# Patient Record
Sex: Female | Born: 1950 | Hispanic: No | Marital: Single
Health system: Northeastern US, Academic
[De-identification: ages and names within clinical notes are randomized; demographics above are authoritative.]

## PROBLEM LIST (undated history)

## (undated) DIAGNOSIS — N289 Disorder of kidney and ureter, unspecified: Secondary | ICD-10-CM

## (undated) DIAGNOSIS — Z5189 Encounter for other specified aftercare: Secondary | ICD-10-CM

## (undated) DIAGNOSIS — I10 Essential (primary) hypertension: Secondary | ICD-10-CM

## (undated) DIAGNOSIS — E119 Type 2 diabetes mellitus without complications: Secondary | ICD-10-CM

## (undated) DIAGNOSIS — I509 Heart failure, unspecified: Secondary | ICD-10-CM

## (undated) HISTORY — PX: ABDOMINAL HYSTERECTOMY: SHX81

## (undated) HISTORY — PX: OTHER SURGICAL HISTORY: SHX169

---

## 2020-01-03 ENCOUNTER — Telehealth: Admit: 2020-01-03 | Payer: PRIVATE HEALTH INSURANCE | Attending: Hematology & Oncology

## 2020-01-03 NOTE — Telephone Encounter
S/W Maralyn Sago. Clarification is that patient has been worked up by GI already and Nephrologist is requesting a hematologic workup. Scheduling staff notified and will get patient scheduled to be seen by Dr. Daryel November. .Electronically Signed by Peggye Pitt, RN, January 03, 2020

## 2020-01-03 NOTE — Telephone Encounter
Sarah from [employer] called requesting an appt because the patient has low HGB, unexplained. Maralyn Sago can reach at [phone].

## 2020-01-03 NOTE — Telephone Encounter
Maralyn Sago called returning Joann's call. Maralyn Sago can reach at [phone].

## 2020-01-03 NOTE — Telephone Encounter
TC placed to [facility]. Maralyn Sago was not available. Message left explaining that the Nephrologist, Dr. Williams Che follows the patient's anemia and orders iron as needed. Patient was seen by Dr. Daryel November for Hx of DVTs and anticoagulation. Informed nurse that they can call office back with any further questions or concerns. .Electronically Signed by Peggye Pitt, RN, January 03, 2020

## 2020-01-05 ENCOUNTER — Ambulatory Visit: Admit: 2020-01-05 | Payer: MEDICAID | Attending: Hematology & Oncology

## 2020-01-05 ENCOUNTER — Encounter: Admit: 2020-01-05 | Payer: PRIVATE HEALTH INSURANCE | Attending: Hematology & Oncology

## 2020-01-05 ENCOUNTER — Ambulatory Visit: Admit: 2020-01-05 | Payer: PRIVATE HEALTH INSURANCE | Attending: Hematology & Oncology

## 2020-01-05 ENCOUNTER — Inpatient Hospital Stay: Admit: 2020-01-05 | Discharge: 2020-01-05 | Payer: BLUE CROSS/BLUE SHIELD

## 2020-01-05 DIAGNOSIS — I824Y9 Acute embolism and thrombosis of unspecified deep veins of unspecified proximal lower extremity: Secondary | ICD-10-CM

## 2020-01-05 DIAGNOSIS — N186 End stage renal disease: Secondary | ICD-10-CM

## 2020-01-05 LAB — CBC WITH AUTO DIFFERENTIAL
BKR WAM ABSOLUTE IMMATURE GRANULOCYTES: 0 x 1000/??L (ref 0.0–0.3)
BKR WAM ABSOLUTE LYMPHOCYTE COUNT: 0.6 x 1000/??L — ABNORMAL LOW (ref 1.0–4.0)
BKR WAM ABSOLUTE NRBC: 0 x 1000/ÂµL (ref 0.0–0.0)
BKR WAM ANALYZER ANC: 4.5 x 1000/??L (ref 1.0–11.0)
BKR WAM BASOPHIL ABSOLUTE COUNT: 0 x 1000/??L (ref 0.0–0.0)
BKR WAM BASOPHILS: 0.5 % (ref 0.0–4.0)
BKR WAM EOSINOPHIL ABSOLUTE COUNT: 0.6 x 1000/??L (ref 0.0–1.0)
BKR WAM EOSINOPHILS: 9.9 % — ABNORMAL HIGH (ref 0.0–7.0)
BKR WAM HEMATOCRIT: 32.7 % — ABNORMAL LOW (ref 37.0–52.0)
BKR WAM HEMOGLOBIN: 10.5 g/dL — ABNORMAL LOW (ref 12.0–18.0)
BKR WAM IMMATURE GRANULOCYTES: 0.7 % (ref 0.0–3.0)
BKR WAM LYMPHOCYTES: 10.2 % (ref 8.0–49.0)
BKR WAM MCH (PG): 31.8 pg — ABNORMAL HIGH (ref 27.0–31.0)
BKR WAM MCHC: 32.1 g/dL (ref 31.0–36.0)
BKR WAM MCV: 99.1 fL — ABNORMAL HIGH (ref 78.0–94.0)
BKR WAM MONOCYTE ABSOLUTE COUNT: 0.1 x 1000/??L (ref 0.0–2.0)
BKR WAM MONOCYTES: 1.9 % — ABNORMAL LOW (ref 4.0–15.0)
BKR WAM MPV: 12.1 fL — ABNORMAL HIGH (ref 6.0–11.0)
BKR WAM NEUTROPHILS: 76.8 % (ref 37.0–84.0)
BKR WAM NUCLEATED RED BLOOD CELLS: 0 % (ref 0.0–1.0)
BKR WAM PLATELETS: 169 x1000/??L (ref 140–440)
BKR WAM RDW-CV: 18.8 % — ABNORMAL HIGH (ref 11.5–14.5)
BKR WAM RED BLOOD CELL COUNT: 3.3 M/??L — ABNORMAL LOW (ref 3.8–5.9)
BKR WAM WHITE BLOOD CELL COUNT: 5.9 x1000/??L (ref 4.0–10.0)

## 2020-01-05 LAB — FERRITIN: BKR FERRITIN: 2569 ng/mL — ABNORMAL HIGH (ref 15–150)

## 2020-01-05 LAB — IRON AND TIBC
BKR IRON SATURATION: 2569 ng/mL — ABNORMAL HIGH (ref 15–150)
BKR IRON: 26 ug/dL — ABNORMAL LOW (ref 37–145)

## 2020-01-05 LAB — RETICULOCYTES
BKR WAM IRF: 11.8 % (ref 3.0–15.9)
BKR WAM RETICULOCYTE - ABS: 0.0337 10Ë6 cells/uL (ref 0.0230–0.1400)
BKR WAM RETICULOCYTE COUNT PCT: 1.02 % (ref 0.60–2.70)
BKR WAM RETICULOCYTE HGB EQUIVALENT: 35 pg (ref 28.2–35.7)

## 2020-01-05 LAB — D-DIMER, QUANTITATIVE: BKR D-DIMER: 0.35 mg{FEU}/L (ref <=0.67)

## 2020-01-05 NOTE — Progress Notes
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 Hematology HistoryShe sees a kidney specialist who give her IV iron.  She is concerned that she is always anemic. She has had 2 clots in her same leg.  The first in 2014 and the second in 2017.  She was then placed on life-long anticoagulation, Coumadin.ECOG  0HISTORY OF PRESENT ILLNESS:Ms. Lisa [name]  Is 69 y.o. 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 She has a past medical history of ESRd and has been on dialysis on MWF since December 2018. She has long standing diabetes and hypertension and a history of myesthesia gravis.She sees a kidney specialist who gives her IV iron.  She is concerned that she is always anemic. She has had 2 clots in her right leg.  The first in 2014 and the second in 2017.  She was then placed on life-long anticoagulation, Coumadin. She admits pain at the site of her clots.She denies alcohol use and doesn't smoke.Family history of cancer includes her mother with breast cancer, her brother with stomach cancer.She has 1 son.Admits to feeling fatigued. Denies headaches though if she does move too quickly she does have some dizziness. At this visit she is accompanied by her daughter and states that she has needed more transfusions in the last 12 to 16 weeks.  She has already received 4 units of blood.  The nature of her anemia seems to be changing at this time and she is becoming more transfusion dependent. HerReview of Systems Constitutional: Positive for fatigue. Negative for appetite change and fever. HENT: Negative for facial swelling and trouble swallowing.  Eyes: Negative for pain. Respiratory: Negative for shortness of breath.  Cardiovascular: Negative for leg swelling. Gastrointestinal: Negative for constipation, diarrhea, nausea and vomiting. Endocrine: Negative.  Genitourinary: Negative.  Musculoskeletal: Positive for myalgias (pain in right leg at site of past DVT's). Negative for arthralgias. Skin: Negative for rash. Allergic/Immunologic: Negative.  Neurological: Negative for dizziness and headaches. Hematological: Negative.  Psychiatric/Behavioral: The patient is not nervous/anxious.  A comprehensive 12-point review of systems was queried and is otherwise negative.PAST MEDICAL HISTORY: [name] has a past medical history of Arthritis (02/09/2018), Bulging lumbar disc (02/09/2018), Deep vein thrombosis (DVT) of lower extremity (HC Code) (02/09/2018), Diabetes mellitus, type II (HC Code) (02/09/2018), ESRD (end stage renal disease) (HC Code) (02/09/2018), Exertional dyspnea (02/09/2018), Fibroids (02/09/2018), History of thoracotomy (02/09/2018), HTN (hypertension) (02/09/2018), tonsillectomy (02/09/2018), Hyperlipidemia (02/09/2018), Myasthenia gravis (HC Code) (02/09/2018), Obesity (02/09/2018), PVD (peripheral vascular disease) (HC Code) (03/26/2018), S/P TAH-BSO (02/09/2018), and 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  azaTHIOprine, 200 mg, Oral, Daily?  doxazosin, 8 mg, Oral, Nightly?  furosemide, 40 mg, Oral, Daily?  insulin degludec, 4 Units, Subcutaneous, QAM?  labetaloL, 300 mg, Oral, Daily?  olmesartan, 40 mg, Oral, Daily?  omeprazole, 40 mg, Oral, BID?  pravastatin, 40 mg, Oral, Daily?  predniSONE, 5 mg, Oral, Daily?  vitamin B complex (B COMPLEX 1 ORAL), B ComplexNo current facility-administered medications for this visit. SOCIAL HISTORY: Ms. Lisa [name]  reports that she has never smoked. She does not have any smokeless tobacco  history on file. She reports previous alcohol use. No history on file for drug.FAMILY HISTORY: Her family history includes Breast cancer in her mother; Cervical cancer in her paternal grandmother; Stomach cancer in her brother.PHYSICAL EXAM:There were no vitals taken for this visit.Physical ExamConstitutional:     Appearance: Normal appearance. HENT:    Head: Normocephalic and atraumatic.    Nose: Nose normal.    Mouth/Throat:    Mouth: Mucous membranes are moist.    Pharynx: Oropharynx is clear. Eyes:    Extraocular Movements: Extraocular movements intact.    Conjunctiva/sclera: Conjunctivae normal.    Pupils: Pupils are equal, round, and reactive to light. Neck:    Musculoskeletal: Normal range of motion and neck supple. Cardiovascular:    Rate and Rhythm: Normal rate and regular rhythm.    Pulses: Normal pulses.    Heart sounds: Normal heart sounds. Pulmonary:    Effort: Pulmonary effort is normal.    Breath sounds: Normal breath sounds. No wheezing. Abdominal:    General: Bowel sounds are normal.    Palpations: Abdomen is soft.    Tenderness: There is no abdominal tenderness. Musculoskeletal: Normal range of motion. Lymphadenopathy:    Cervical: No cervical adenopathy. Skin:   General: Skin is warm and dry. Neurological:    General: No focal deficit present.    Mental Status: She is alert and oriented to person, place, and time. Psychiatric:       Mood and Affect: Mood normal.       Behavior: Behavior normal. Data Review:Complete Blood CountResults in Past 7 DaysResult Component Current Result Hematocrit 32.7 (L) (01/05/2020) Hemoglobin 10.5 (L) (01/05/2020) MCH 31.8 (H) (01/05/2020) MCHC 32.1 (01/05/2020) MCV 99.1 (H) (01/05/2020) MPV 12.1 (H) (01/05/2020) Platelets 169 (01/05/2020) RBC 3.3 (L) (01/05/2020) WBC 5.9 (01/05/2020)    Chemistry    Component Value Date/Time  NA 143 05/06/2019 1431  K 3.7 05/06/2019 1431  CL 99 05/06/2019 1431  CO2 29 05/06/2019 1431  BUN 25 (H) 05/06/2019 1431  CREATININE 5.83 (HH) 05/06/2019 1431  GLU 177 (H) 05/06/2019 1431    Component Value Date/Time  CALCIUM 9.8 05/06/2019 1431  ALKPHOS 85 05/06/2019 1431  AST 17 05/06/2019 1431  ALT 10 05/06/2019 1431  BILITOT 0.5 05/06/2019 1431   IMPRESSION and PLAN: Bijal Siglin is a 69 y.o. female with anemia.Anemia is secondary to chronic disease however we will do a complete work up and check monoclonal protein.She sees Dr. Army Melia, Nephrologist who manages her dialysis.Can use 5 mg of melatonin as needed to help with sleep.At this point she will follow up in 4 weeks to go over her lab results.Undergoing dialysis and therefore will need a work up for her anemia. MDS still remains a consideration. If we cannot find a positive cause she will need a bone marrow aspirate biopsy.Scribed for Dr. Daryel November by [facility] Moe,medical scribe. July 14, 2021Electronically Signed by Felicity Pellegrini, July 14, 2021The documentation recorded by the scribe accurately reflects the service I personally performed and the decisions made by me.

## 2020-01-06 LAB — TOTAL PROTEIN AND ALBUMIN,SERUM  (YH)
BKR ALBUMIN, SERUM: 3.6 g/dL (ref 3.6–4.9)
BKR TOTAL PROTEIN, SERUM: 6.3 g/dL (ref 6.0–8.3)

## 2020-01-06 LAB — VITAMIN B12: BKR VITAMIN B12: >2000 pg/mL — ABNORMAL HIGH (ref 232–1245)

## 2020-01-06 LAB — PROTEIN ELECTROPHORESIS, SERUM    (YH)
BKR ALBUMIN ELP: 3.42 g/dL — ABNORMAL LOW (ref 3.50–4.70)
BKR ALPHA-1-GLOBULIN: 0.32 g/dL — ABNORMAL HIGH (ref 0.10–0.30)
BKR ALPHA-2-GLOBULIN: 0.88 g/dL — ABNORMAL LOW (ref 0.60–1.00)
BKR BETA GLOBULIN: 0.84 g/dL — ABNORMAL HIGH (ref 0.70–1.20)
BKR GAMMA GLOBULIN: 0.84 g/dL (ref 0.70–1.50)

## 2020-01-06 LAB — FREE KAPPA LAMBDA WITH RATIO, SERUM
BKR IGA: 16.95 mg/dL — ABNORMAL HIGH (ref 0.33–1.94)
BKR KAPPA FLC, SERUM: 16.95 mg/dL — ABNORMAL HIGH (ref 0.33–1.94)
BKR KAPPA/LAMBDA FLC RATIO, SERUM: 1.28 (ref 0.26–1.65)
BKR LAMBDA FLC, SERUM: 13.27 mg/dL — ABNORMAL HIGH (ref 0.57–2.63)

## 2020-01-06 LAB — IMMUNOGLOBULINS IGG, IGA, IGM
BKR IGG: 800 mg/dL (ref 700–1600)
BKR IGM: 53 mg/dL — ABNORMAL HIGH (ref 40–230)

## 2020-01-06 LAB — FOLATE: BKR FOLATE: 5.8 ng/mL (ref 3.0–15.9)

## 2020-01-07 LAB — IMMUNOFIXATION ELECTROPHORESIS, SERUM

## 2020-01-25 ENCOUNTER — Encounter: Admit: 2020-01-25 | Payer: PRIVATE HEALTH INSURANCE | Attending: Vascular and Interventional Radiology

## 2020-02-01 ENCOUNTER — Encounter: Admit: 2020-02-01 | Payer: PRIVATE HEALTH INSURANCE | Attending: Family

## 2020-02-01 DIAGNOSIS — N186 End stage renal disease: Secondary | ICD-10-CM

## 2020-02-03 ENCOUNTER — Encounter: Admit: 2020-02-03 | Payer: PRIVATE HEALTH INSURANCE | Attending: Family

## 2020-02-03 ENCOUNTER — Ambulatory Visit: Admit: 2020-02-03 | Payer: PRIVATE HEALTH INSURANCE

## 2020-02-03 ENCOUNTER — Ambulatory Visit: Admit: 2020-02-03 | Payer: PRIVATE HEALTH INSURANCE | Attending: Family

## 2020-02-03 DIAGNOSIS — N186 End stage renal disease: Secondary | ICD-10-CM

## 2020-02-10 ENCOUNTER — Ambulatory Visit: Admit: 2020-02-10 | Payer: BLUE CROSS/BLUE SHIELD | Attending: Family

## 2020-02-10 ENCOUNTER — Ambulatory Visit: Admit: 2020-02-10 | Payer: PRIVATE HEALTH INSURANCE | Attending: Family

## 2020-02-10 ENCOUNTER — Inpatient Hospital Stay: Admit: 2020-02-10 | Discharge: 2020-02-10 | Payer: BLUE CROSS/BLUE SHIELD

## 2020-02-10 ENCOUNTER — Encounter: Admit: 2020-02-10 | Payer: PRIVATE HEALTH INSURANCE | Attending: Family

## 2020-02-10 DIAGNOSIS — N186 End stage renal disease: Secondary | ICD-10-CM

## 2020-02-10 DIAGNOSIS — M1990 Unspecified osteoarthritis, unspecified site: Secondary | ICD-10-CM

## 2020-02-10 DIAGNOSIS — D631 Anemia in chronic kidney disease: Secondary | ICD-10-CM

## 2020-02-10 DIAGNOSIS — I82409 Acute embolism and thrombosis of unspecified deep veins of unspecified lower extremity: Secondary | ICD-10-CM

## 2020-02-10 DIAGNOSIS — E669 Obesity, unspecified: Secondary | ICD-10-CM

## 2020-02-10 DIAGNOSIS — I824Y9 Acute embolism and thrombosis of unspecified deep veins of unspecified proximal lower extremity: Secondary | ICD-10-CM

## 2020-02-10 DIAGNOSIS — D508 Other iron deficiency anemias: Secondary | ICD-10-CM

## 2020-02-10 DIAGNOSIS — Z9889 Other specified postprocedural states: Secondary | ICD-10-CM

## 2020-02-10 DIAGNOSIS — M5136 Other intervertebral disc degeneration, lumbar region: Secondary | ICD-10-CM

## 2020-02-10 DIAGNOSIS — R0609 Other forms of dyspnea: Secondary | ICD-10-CM

## 2020-02-10 DIAGNOSIS — Z9089 Acquired absence of other organs: Secondary | ICD-10-CM

## 2020-02-10 DIAGNOSIS — I10 Essential (primary) hypertension: Secondary | ICD-10-CM

## 2020-02-10 DIAGNOSIS — Z90710 Acquired absence of both cervix and uterus: Secondary | ICD-10-CM

## 2020-02-10 DIAGNOSIS — Z992 Dependence on renal dialysis: Secondary | ICD-10-CM

## 2020-02-10 DIAGNOSIS — I739 Peripheral vascular disease, unspecified: Secondary | ICD-10-CM

## 2020-02-10 DIAGNOSIS — G7000 Myasthenia gravis without (acute) exacerbation: Secondary | ICD-10-CM

## 2020-02-10 DIAGNOSIS — E119 Type 2 diabetes mellitus without complications: Secondary | ICD-10-CM

## 2020-02-10 DIAGNOSIS — G5700 Lesion of sciatic nerve, unspecified lower limb: Secondary | ICD-10-CM

## 2020-02-10 DIAGNOSIS — E785 Hyperlipidemia, unspecified: Secondary | ICD-10-CM

## 2020-02-10 DIAGNOSIS — D219 Benign neoplasm of connective and other soft tissue, unspecified: Secondary | ICD-10-CM

## 2020-02-10 LAB — COMPREHENSIVE METABOLIC PANEL
BKR A/G RATIO: 1.4 (ref 1.0–2.2)
BKR ALANINE AMINOTRANSFERASE (ALT): 5 U/L — ABNORMAL LOW (ref 10–35)
BKR ALKALINE PHOSPHATASE: 80 U/L (ref 9–122)
BKR ANION GAP: 11 (ref 7–17)
BKR ASPARTATE AMINOTRANSFERASE (AST): 16 U/L (ref 10–35)
BKR AST/ALT RATIO: 3.2 % (ref 21–32)
BKR BILIRUBIN TOTAL: 0.4 mg/dL (ref <=1.2)
BKR BLOOD UREA NITROGEN: 27 mg/dL — ABNORMAL HIGH (ref 8–23)
BKR BUN / CREAT RATIO: 6.1 U/L — ABNORMAL LOW (ref 8.0–23.0)
BKR CALCIUM: 9.7 mg/dL (ref 8.8–10.2)
BKR CHLORIDE: 101 mmol/L (ref 98–107)
BKR CO2: 31 mmol/L — ABNORMAL HIGH (ref 20–30)
BKR CREATININE: 4.46 mg/dL — ABNORMAL HIGH (ref 0.40–1.30)
BKR EGFR (AFR AMER): 12 mL/min/{1.73_m2} (ref >60)
BKR EGFR (NON AFRICAN AMERICAN): 10 mL/min/{1.73_m2} (ref >60)
BKR GLOBULIN: 2.8 g/dL (ref 2.3–3.5)
BKR GLUCOSE: 106 mg/dL — ABNORMAL HIGH (ref 70–100)
BKR POTASSIUM: 4.8 mmol/L (ref 3.3–5.1)
BKR PROTEIN TOTAL: 6.6 g/dL (ref 6.6–8.7)
BKR SODIUM: 143 mmol/L (ref 136–144)

## 2020-02-10 LAB — CBC WITH AUTO DIFFERENTIAL
BKR ALBUMIN: 2 x 1000/??L (ref 1.0–4.0)
BKR SODIUM: 0.3 x 1000/??L (ref 0.0–1.0)
BKR WAM ABSOLUTE IMMATURE GRANULOCYTES: 0.1 x 1000/??L — ABNORMAL LOW (ref 0.0–0.3)
BKR WAM ABSOLUTE LYMPHOCYTE COUNT: 2 x 1000/ÂµL (ref 1.0–4.0)
BKR WAM ABSOLUTE NRBC: 0 x 1000/ÂµL (ref 0.0–0.0)
BKR WAM ANALYZER ANC: 4.3 x 1000/ÂµL (ref 1.0–11.0)
BKR WAM BASOPHIL ABSOLUTE COUNT: 0.1 x 1000/??L — ABNORMAL HIGH (ref 0.0–0.0)
BKR WAM BASOPHILS: 0.7 % (ref 0.0–4.0)
BKR WAM EOSINOPHIL ABSOLUTE COUNT: 0.3 x 1000/ÂµL (ref 0.0–1.0)
BKR WAM EOSINOPHILS: 4.7 % — ABNORMAL LOW (ref 0.0–7.0)
BKR WAM HEMATOCRIT: 29 % — ABNORMAL LOW (ref 37.0–52.0)
BKR WAM HEMOGLOBIN: 9 g/dL — ABNORMAL LOW (ref 12.0–18.0)
BKR WAM IMMATURE GRANULOCYTES: 0.7 % (ref 0.0–3.0)
BKR WAM LYMPHOCYTES: 27.6 % (ref 8.0–49.0)
BKR WAM MCH (PG): 32.6 pg — ABNORMAL HIGH (ref 27.0–31.0)
BKR WAM MCHC: 31 g/dL (ref 31.0–36.0)
BKR WAM MCV: 105.1 fL — ABNORMAL HIGH (ref 78.0–94.0)
BKR WAM MONOCYTE ABSOLUTE COUNT: 0.5 x 1000/ÂµL (ref 0.0–2.0)
BKR WAM MONOCYTES: 6.5 % (ref 4.0–15.0)
BKR WAM MPV: 10.8 fL (ref 6.0–11.0)
BKR WAM NEUTROPHILS: 59.8 % (ref 37.0–84.0)
BKR WAM NUCLEATED RED BLOOD CELLS: 0 % (ref 0.0–1.0)
BKR WAM RDW-CV: 22 % — ABNORMAL HIGH (ref 11.5–14.5)
BKR WAM RED BLOOD CELL COUNT: 2.8 M/ÂµL — ABNORMAL LOW (ref 3.8–5.9)
BKR WAM WHITE BLOOD CELL COUNT: 7.2 x1000/ÂµL (ref 4.0–10.0)

## 2020-02-10 LAB — LACTATE DEHYDROGENASE: BKR LACTATE DEHYDROGENASE: 291 U/L — ABNORMAL HIGH (ref 122–241)

## 2020-02-10 LAB — IRON AND TIBC: BKR IRON: 68 ug/dL (ref 37–145)

## 2020-02-10 LAB — FERRITIN: BKR FERRITIN: 1987 ng/mL — ABNORMAL HIGH (ref 15–150)

## 2020-02-10 MED ORDER — PREDNISONE 5 MG TABLET
5 mg | Status: AC
Start: 2020-02-10 — End: 2020-07-11

## 2020-02-10 MED ORDER — LOPERAMIDE 2 MG CAPSULE
2 mg | ORAL | Status: AC
Start: 2020-02-10 — End: ?

## 2020-02-10 MED ORDER — AMLODIPINE 10 MG TABLET
10 mg | Freq: Every day | ORAL | Status: AC
Start: 2020-02-10 — End: 2020-07-11

## 2020-02-10 MED ORDER — TRESIBA FLEXTOUCH U-200 INSULIN 200 UNIT/ML (3 ML) SUBCUTANEOUS PEN
200 unit/mL (3 mL) | Status: AC
Start: 2020-02-10 — End: ?

## 2020-02-10 MED ORDER — FLUOXETINE 20 MG CAPSULE
20 mg | Status: AC
Start: 2020-02-10 — End: 2020-02-10

## 2020-02-10 MED ORDER — PYRIDOXINE (VITAMIN B6) 50 MG CAPSULE
50 mg | Status: AC
Start: 2020-02-10 — End: 2020-02-10

## 2020-02-10 MED ORDER — CHLORTHALIDONE 50 MG TABLET
50 mg | Status: AC
Start: 2020-02-10 — End: 2020-02-10

## 2020-02-10 MED ORDER — SEVELAMER CARBONATE 800 MG TABLET
800 mg | ORAL | Status: AC
Start: 2020-02-10 — End: 2020-02-10

## 2020-02-10 MED ORDER — FUROSEMIDE 80 MG TABLET
80 mg | ORAL | Status: AC
Start: 2020-02-10 — End: 2020-07-11

## 2020-02-10 MED ORDER — INSULIN LISPRO (U-100) 100 UNIT/ML SUBCUTANEOUS SOLUTION
100 unit/mL | Status: AC
Start: 2020-02-10 — End: 2020-07-11

## 2020-02-10 MED ORDER — SPIRONOLACTONE 25 MG TABLET
25 mg | ORAL | Status: AC
Start: 2020-02-10 — End: 2020-02-10

## 2020-02-10 MED ORDER — AZATHIOPRINE 50 MG TABLET
50 mg | Status: AC
Start: 2020-02-10 — End: 2021-06-07

## 2020-02-10 MED ORDER — APIXABAN 2.5 MG TABLET
2.5 mg | ORAL | Status: AC
Start: 2020-02-10 — End: ?

## 2020-02-10 MED ORDER — AURYXIA ORAL
Status: AC
Start: 2020-02-10 — End: 2020-02-10

## 2020-02-10 MED ORDER — CINACALCET 30 MG TABLET
30 mg | ORAL | Status: AC
Start: 2020-02-10 — End: 2020-02-10

## 2020-02-10 MED ORDER — LABETALOL 300 MG TABLET
300 mg | ORAL | Status: AC
Start: 2020-02-10 — End: 2020-02-10

## 2020-02-10 MED ORDER — FAMOTIDINE 20 MG TABLET
20 mg | Status: AC
Start: 2020-02-10 — End: 2020-07-11

## 2020-02-10 MED ORDER — PEG-ELECTROLYTE SOLUTION 420 GRAM ORAL SOLUTION
420 gram | Status: AC
Start: 2020-02-10 — End: 2020-07-11

## 2020-02-10 MED ORDER — PRAVASTATIN 80 MG TABLET
80 mg | Status: AC
Start: 2020-02-10 — End: ?

## 2020-02-10 MED ORDER — ASPIRIN 81 MG TABLET,DELAYED RELEASE
81 mg | Freq: Every day | ORAL | Status: AC
Start: 2020-02-10 — End: 2020-02-10

## 2020-02-10 MED ORDER — MIRTAZAPINE 15 MG TABLET
15 mg | Status: AC
Start: 2020-02-10 — End: 2020-07-11

## 2020-02-10 MED ORDER — CARVEDILOL IMMEDIATE RELEASE 25 MG TABLET
25 mg | Status: AC
Start: 2020-02-10 — End: ?

## 2020-02-10 MED ORDER — PENTOXIFYLLINE ER 400 MG TABLET,EXTENDED RELEASE
400 mg | Status: AC
Start: 2020-02-10 — End: 2020-02-10

## 2020-02-10 MED ORDER — CYANOCOBALAMIN (VIT B-12) 2,500 MCG SUBLINGUAL TABLET
2500 mcg | Status: AC
Start: 2020-02-10 — End: ?

## 2020-02-10 MED ORDER — CALCIUM CARBONATE 600 MG-VITAMIN D3 10 MCG (400 UNIT) TABLET
1500 mg (600 mg calcium) - 400 unit | Status: AC
Start: 2020-02-10 — End: 2020-02-10

## 2020-02-10 NOTE — Progress Notes
HEMATOLOGY CLINIC FOLLOW UP NOTELouise [name] June 06, [id number] Provider: Ferd Glassing, APRNDate of service: 8/19/2021HEMATOLOGIC DIAGNOSIS: Anemia of unclear etiologyINTERIM HISTORY: Lisa [name]'s energy and activity are okay. She continues to go to dialysis MWF; she is receiving IV iron and ESA at dialysis.  Shortness of breath with exertion but no chest pain or heart palpitations.  No dizziness, lightheadedness or headaches.  Poor appetite, she does crave certain things.  No nausea or vomiting but does wax and wane between diarrhea and constipation.  She continues on her Eliquis 2.5 mg twice daily; no bleeding complications.  She continues to have chronic back pain, she just started seeing pain management and received 6 injections to her back.  She found out that her left side is shorter than her right.  Over the last few months she has lost approximately 30 lb.  Her last colonoscopy was on 10/06/2019.No easy bruising, mucosal bleeding, epistaxis, hematemesis, hemoptysis, melena or hematuria. No fevers, chills, night sweats, dizziness, headaches, vision changes, chest pain, heart palpitations, abdominal pain, nausea, vomiting, urinary issues, lower extremity edema, numbness, tingling or skin rash. REVIEW OF SYSTEMS:Pertinent positives and negatives as per HPI. The remainder of the 12-point ROS were unremarkable. PHYSICAL EXAM:General appearance - Alert, well appearing, and in no acute distress. Pt appears age, acyanotic, and well hydrated. Mental status - Normal mood, behavior, speech, motor activity, and thought processes.Neurological - Alert and oriented to person, place and time. Gross motor skills intact. Eyes - Conjunctivae are normal. No scleral icterus.Mouth - Not examined, pt wearing mask during pandemic. Neck - SuppleLymphatics - No palpable cervical, supraclavicular or axillary lymphadenopathy.Chest - Clear to auscultation bilaterally, no wheezes or rales. Heart - Normal rate and regular rhythm, no murmurs, rubs or gallops. Abdomen - Soft, nontender, nondistended and no masses. Extremities - Full range of motion. No pedal edema. Skin - Normal coloration and turgor. No rashes, skin lesions or petechiae. VITAL SIGNS:Vitals:  02/10/20 0936 BP: (!) 140/70 Pulse: 90 Resp: 18 Temp: 97.3 ?F (36.3 ?C) SpO2: 99% MEDICATIONS:Current Outpatient Medications Medication Sig ? amLODIPine (NORVASC) 10 mg tablet Take 10 mg by mouth daily. ? apixaban (ELIQUIS) 2.5 mg tablet Take 2.5 mg by mouth. ? azaTHIOprine (IMURAN) 50 mg tablet Take four 50 mg tablets (200 mg total) once daily. ? carvediloL (COREG) 25 mg Immediate Release tablet TAKE 1 TABLET BY MOUTH TWICE DAILY WITH MEALS ? cyanocobalamin, vitamin B-12, 2,500 mcg Subl DISSOLVE 1 TABLET ONCE DAILY IN THE MORNING FOR 90 DAYS ? furosemide (LASIX) 80 mg tablet Take 80 mg by mouth. ? insulin degludec (TRESIBA FLEXTOUCH U-200 INSULIN) 200 unit/mL (3 mL) pen INJECT 4 UNITS SUBCUTANEOUSLY EVERY DAY. DISCARD PEN 56 DAYS AFTER OPENING ? insulin lispro (HUMALOG U-100 INSULIN) 100 unit/mL vial Humalog U-100 Insulin 100 unit/mL subcutaneous solution ? mirtazapine (REMERON) 15 mg tablet TAKE 1 TABLET BY MOUTH AT NIGHT EVERY OTHER DAY ? olmesartan (BENICAR) 40 MG tablet Take 40 mg by mouth daily. ? polyethylene glycol-electrolytes (NULYTELY) 420 gram solution USE AS DIRECTED ONCE ? pravastatin (PRAVACHOL) 80 mg tablet TAKE 1 TABLET BY MOUTH ONCE DAILY ? predniSONE (DELTASONE) 5 mg tablet 6 mg. ? famotidine (PEPCID) 20 mg tablet TAKE 1 TABLET BY MOUTH TWICE DAILY (Patient not taking: Reported on 02/10/2020) ? loperamide (IMODIUM) 2 mg capsule Take 2 mg by mouth. (Patient not taking: Reported on 02/10/2020) No current facility-administered medications for this visit. LABS:Results for orders placed or performed in visit on 02/10/20 Lactate dehydrogenase Result Value Ref Range  LD 291 (H) 122 - 241 U/L Iron  and TIBC Result Value Ref Range  Iron 68 37 - 145 ug/dL  Iron Saturation    TIBC   Ferritin Result Value Ref Range  Ferritin 1,987 (H) 15 - 150 ng/mL CBC auto differential Result Value Ref Range  WBC 7.2 4.0 - 10.0 x1000/?L  RBC 2.8 (L) 3.8 - 5.9 M/?L  Hemoglobin 9.0 (L) 12.0 - 18.0 g/dL  Hematocrit 16.1 (L) 09.6 - 52.0 %  MCV 105.1 (H) 78.0 - 94.0 fL  MCHC 31.0 31.0 - 36.0 g/dL  RDW-CV 04.5 (H) 40.9 - 14.5 %  Platelets 273 140 - 440 x1000/?L  MPV 10.8 6.0 - 11.0 fL  ANC (Abs Neutrophil Count) 4.3 1.0 - 11.0 x 1000/?L  Neutrophils 59.8 37.0 - 84.0 %  Lymphocytes 27.6 8.0 - 49.0 %  Absolute Lymphocyte Count 2.0 1.0 - 4.0 x 1000/?L  Monocytes 6.5 4.0 - 15.0 %  Monocyte Absolute Count 0.5 0.0 - 2.0 x 1000/?L  Eosinophils 4.7 0.0 - 7.0 %  Eosinophil Absolute Count 0.3 0.0 - 1.0 x 1000/?L  Basophil 0.7 0.0 - 4.0 %  Basophil Absolute Count 0.1 (H) 0.0 - 0.0 x 1000/?L  Immature Granulocytes 0.7 0.0 - 3.0 %  Absolute Immature Granulocyte Count 0.1 0.0 - 0.3 x 1000/?L  nRBC 0.0 0.0 - 1.0 %  Absolute nRBC 0.0 0.0 - 0.0 x 1000/?L  MCH 32.6 (H) 27.0 - 31.0 pg Comprehensive metabolic panel Result Value Ref Range  Sodium 143 136 - 144 mmol/L  Potassium 4.8 3.3 - 5.1 mmol/L  Chloride 101 98 - 107 mmol/L  CO2 31 (H) 20 - 30 mmol/L  Anion Gap 11 7 - 17  Glucose 106 (H) 70 - 100 mg/dL  BUN 27 (H) 8 - 23 mg/dL  Creatinine 8.11 (H) 9.14 - 1.30 mg/dL  Calcium 9.7 8.8 - 78.2 mg/dL  BUN/Creatinine Ratio 6.1 (L) 8.0 - 23.0  Total Protein 6.6 6.6 - 8.7 g/dL  Albumin 3.8 3.6 - 4.9 g/dL  Total Bilirubin 0.4 <=9.5 mg/dL  Alkaline Phosphatase 80 9 - 122 U/L  Alanine Aminotransferase (ALT) 5 (L) 10 - 35 U/L  Aspartate Aminotransferase (AST) 16 10 - 35 U/L  Globulin 2.8 2.3 - 3.5 g/dL  A/G Ratio 1.4 1.0 - 2.2  AST/ALT Ratio 3.2 See Comment  eGFR (Afr Amer) 12 >60 mL/min/1.7m2  eGFR (NON African-American) 10 >60 mL/min/1.67m2 ASSESSMENT/PLAN:Lisa [name] is a 69 year old female with history of 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  Patient here for follow-up visit for anemia of unknown etiology.  The patient and plan were reviewed with Dr. Daryel November.  Anemia:Anemia had been stable, hemoglobin 10.0-10.5, recent decrease to 9.0.Macrocytic anemia, hemoglobin 9.0 and MCV 105.1. Ordered blood smear today, pending.Cannot rule out MDS, will proceed with bone marrow aspirate/biopsy.Ordered [place] guided with interventional radiology at [facility]. Cancer screenings:Colonoscopy on 10/06/2019. Mammogram on 02/06/2018. RTC: 4 weeks for labs/Dr. Daryel November to review bone marrow resultsI spent 35 minutes in direct contact with patient, of which at least half was spent in counseling and coordination of care.

## 2020-02-11 ENCOUNTER — Encounter: Admit: 2020-02-11 | Payer: PRIVATE HEALTH INSURANCE | Attending: Family

## 2020-02-11 DIAGNOSIS — N186 End stage renal disease: Secondary | ICD-10-CM

## 2020-02-11 LAB — BLOOD SMEAR MD INTERPRETATION     (YH)

## 2020-02-14 ENCOUNTER — Encounter: Admit: 2020-02-14 | Payer: PRIVATE HEALTH INSURANCE | Attending: Vascular and Interventional Radiology

## 2020-02-15 ENCOUNTER — Encounter: Admit: 2020-02-15 | Payer: PRIVATE HEALTH INSURANCE | Attending: Vascular and Interventional Radiology

## 2020-02-17 ENCOUNTER — Encounter: Admit: 2020-02-17 | Payer: PRIVATE HEALTH INSURANCE

## 2020-02-17 ENCOUNTER — Encounter: Admit: 2020-02-17 | Payer: PRIVATE HEALTH INSURANCE | Attending: Vascular and Interventional Radiology

## 2020-03-23 ENCOUNTER — Ambulatory Visit: Admit: 2020-03-23 | Payer: PRIVATE HEALTH INSURANCE | Attending: Family

## 2020-03-23 ENCOUNTER — Encounter: Admit: 2020-03-23 | Payer: PRIVATE HEALTH INSURANCE | Attending: Family

## 2020-03-23 ENCOUNTER — Inpatient Hospital Stay: Admit: 2020-03-23 | Discharge: 2020-03-23 | Payer: BLUE CROSS/BLUE SHIELD

## 2020-03-23 ENCOUNTER — Ambulatory Visit: Admit: 2020-03-23 | Payer: BLUE CROSS/BLUE SHIELD | Attending: Hematology & Oncology

## 2020-03-23 ENCOUNTER — Ambulatory Visit: Admit: 2020-03-23 | Payer: PRIVATE HEALTH INSURANCE | Attending: Hematology & Oncology

## 2020-03-23 ENCOUNTER — Ambulatory Visit: Admit: 2020-03-23 | Payer: PRIVATE HEALTH INSURANCE

## 2020-03-23 DIAGNOSIS — N186 End stage renal disease: Secondary | ICD-10-CM

## 2020-03-23 DIAGNOSIS — Z992 Dependence on renal dialysis: Secondary | ICD-10-CM

## 2020-03-23 DIAGNOSIS — D631 Anemia in chronic kidney disease: Secondary | ICD-10-CM

## 2020-03-23 LAB — HOMOCYSTEINE     (BH GH LMW Q YH): BKR HOMOCYSTEINE: 16.4 umol/L — ABNORMAL HIGH (ref 4.0–15.0)

## 2020-03-23 LAB — COMPREHENSIVE METABOLIC PANEL
BKR A/G RATIO: 1.3 (ref 1.0–2.2)
BKR ALANINE AMINOTRANSFERASE (ALT): 7 U/L — ABNORMAL LOW (ref 10–35)
BKR ALBUMIN: 4 g/dL (ref 3.6–4.9)
BKR ANION GAP: 14 (ref 7–17)
BKR ASPARTATE AMINOTRANSFERASE (AST): 17 U/L (ref 10–35)
BKR AST/ALT RATIO: 2.4
BKR BILIRUBIN TOTAL: 0.3 mg/dL (ref <=1.2)
BKR BLOOD UREA NITROGEN: 33 mg/dL — ABNORMAL HIGH (ref 8–23)
BKR BUN / CREAT RATIO: 5.6 — ABNORMAL LOW (ref 8.0–23.0)
BKR CALCIUM: 10.5 mg/dL — ABNORMAL HIGH (ref 8.8–10.2)
BKR CHLORIDE: 96 mmol/L — ABNORMAL LOW (ref 98–107)
BKR CO2: 28 mmol/L (ref 20–30)
BKR CREATININE: 5.94 mg/dL — CR (ref 0.40–1.30)
BKR EGFR (AFR AMER): 9 mL/min/1.73m2 — ABNORMAL HIGH (ref >60)
BKR EGFR (NON AFRICAN AMERICAN): 7 mL/min/{1.73_m2} (ref >60)
BKR GLOBULIN: 3 g/dL (ref 2.3–3.5)
BKR GLUCOSE: 154 mg/dL — ABNORMAL HIGH (ref 70–100)
BKR POTASSIUM: 4.7 mmol/L — ABNORMAL LOW (ref 3.3–5.1)
BKR PROTEIN TOTAL: 7 g/dL (ref 6.6–8.7)

## 2020-03-23 LAB — CBC WITH AUTO DIFFERENTIAL
BKR ALKALINE PHOSPHATASE: 0.3 x 1000/??L (ref 0.0–2.0)
BKR WAM ABSOLUTE IMMATURE GRANULOCYTES: 0.1 x 1000/ÂµL (ref 0.0–0.3)
BKR WAM ABSOLUTE LYMPHOCYTE COUNT: 0.9 x 1000/??L — ABNORMAL LOW (ref 1.0–4.0)
BKR WAM ABSOLUTE NRBC: 0 x 1000/??L (ref 0.0–0.0)
BKR WAM ANALYZER ANC: 5.3 x 1000/??L — ABNORMAL HIGH (ref 1.0–11.0)
BKR WAM BASOPHIL ABSOLUTE COUNT: 0 x 1000/??L (ref 0.0–0.0)
BKR WAM BASOPHILS: 0.6 % (ref 0.0–4.0)
BKR WAM EOSINOPHIL ABSOLUTE COUNT: 0.2 x 1000/ÂµL (ref 0.0–1.0)
BKR WAM EOSINOPHILS: 2.7 % (ref 0.0–7.0)
BKR WAM HEMATOCRIT: 29.7 % — ABNORMAL LOW (ref 37.0–52.0)
BKR WAM HEMOGLOBIN: 9.6 g/dL — ABNORMAL LOW (ref 12.0–18.0)
BKR WAM IMMATURE GRANULOCYTES: 0.7 % (ref 0.0–3.0)
BKR WAM LYMPHOCYTES: 12.8 % (ref 8.0–49.0)
BKR WAM MCH (PG): 33.7 pg — ABNORMAL HIGH (ref 27.0–31.0)
BKR WAM MCHC: 32.3 g/dL (ref 31.0–36.0)
BKR WAM MCV: 104.2 fL — ABNORMAL HIGH (ref 78.0–94.0)
BKR WAM MONOCYTE ABSOLUTE COUNT: 0.3 x 1000/ÂµL (ref 0.0–2.0)
BKR WAM MONOCYTES: 5 % (ref 4.0–15.0)
BKR WAM MPV: 10.6 fL — CR (ref 6.0–11.0)
BKR WAM NEUTROPHILS: 78.2 % — ABNORMAL LOW (ref 37.0–84.0)
BKR WAM NUCLEATED RED BLOOD CELLS: 0.3 % (ref 0.0–1.0)
BKR WAM PLATELETS: 273 x1000/ÂµL (ref 140–440)
BKR WAM RDW-CV: 20.3 % — ABNORMAL HIGH (ref 11.5–14.5)
BKR WAM RED BLOOD CELL COUNT: 2.9 M/ÂµL — ABNORMAL LOW (ref 3.8–5.9)
BKR WAM WHITE BLOOD CELL COUNT: 6.7 x1000/ÂµL (ref 4.0–10.0)

## 2020-03-23 LAB — FERRITIN: BKR FERRITIN: 1745 ng/mL — ABNORMAL HIGH (ref 15–150)

## 2020-03-23 LAB — HAPTOGLOBIN: BKR HAPTOGLOBIN: 54 mg/dL (ref 30–200)

## 2020-03-23 LAB — LACTATE DEHYDROGENASE: BKR LACTATE DEHYDROGENASE: 242 U/L — ABNORMAL HIGH (ref 122–241)

## 2020-03-23 LAB — IRON AND TIBC

## 2020-03-23 LAB — RETICULOCYTES
BKR WAM IRF: 22.7 % — ABNORMAL HIGH (ref 3.0–15.9)
BKR WAM RETICULOCYTE - ABS: 0.075 10Ë6 cells/uL (ref 0.0230–0.1400)
BKR WAM RETICULOCYTE COUNT PCT: 2.63 % (ref 0.60–2.70)

## 2020-03-23 NOTE — Progress Notes
Re: Lisa [name]: [birth year]MRN: [id number] Provider: Denzil Magnuson, MDDate: 09/30/2021REASON FOR VISIT: Chief Complaint Patient presents with ? Follow-up Visit REFERRING PROVIDER: Hollie Beach, PADIAGNOSIS:Encounter Diagnosis Name Primary? ? Anemia due to chronic kidney disease, on chronic dialysis (HC Code) Yes HEMATOLOGY HISTORY:She sees a kidney specialist who give her IV iron.  She is concerned that she is always anemic. She has had 2 clots in her same leg.  The first in 2014 and the second in 2017.  She was then placed on life-long anticoagulation, Coumadin.CURRENT TREATMENT:Lifelong Coumadin - started 2017ECOG: 0HISTORY OF PRESENT ILLNESS:Lisa [name] is a 69 y.o. 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 HENT: Negative for facial swelling and trouble swallowing.  Eyes: Negative for pain. Respiratory: Negative for shortness of breath.  Cardiovascular: Negative for leg swelling. Gastrointestinal: Negative for constipation, diarrhea, nausea and vomiting. Skin: Negative for rash. Neurological: Negative for dizziness and headaches. Psychiatric/Behavioral: The patient is not nervous/anxious.  A comprehensive 12-point review of systems was queried and is otherwise negative.PAST MEDICAL HISTORY: Shailene Brittingham has a past medical history of Arthritis (02/09/2018), Bulging lumbar disc (02/09/2018), Deep vein thrombosis (DVT) of lower extremity (HC Code) (02/09/2018), Diabetes mellitus, type II (HC Code) (02/09/2018), ESRD (end stage renal disease) (HC Code) (02/09/2018), Exertional dyspnea (02/09/2018), Fibroids (02/09/2018), History of thoracotomy (02/09/2018), HTN (hypertension) (02/09/2018), tonsillectomy (02/09/2018), Hyperlipidemia (02/09/2018), Myasthenia gravis (HC Code) (02/09/2018), Obesity (02/09/2018), PVD (peripheral vascular disease) (HC Code) (03/26/2018), S/P TAH-BSO (02/09/2018), and 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  amLODIPine, 10 mg, Oral, Daily?  apixaban, Take 2.5 mg by mouth.?  azaTHIOprine, Take four 50 mg tablets (200 mg total) once daily.?  carvediloL, TAKE 1 TABLET BY MOUTH TWICE DAILY WITH MEALS?  cyanocobalamin (vitamin B-12), DISSOLVE 1 TABLET ONCE DAILY IN THE MORNING FOR 90 DAYS?  famotidine, TAKE 1 TABLET BY MOUTH TWICE DAILY (Patient not taking: Reported on 02/10/2020)?  furosemide, Take 80 mg by mouth.?  TRESIBA FLEXTOUCH U-200 INSULIN, INJECT 4 UNITS SUBCUTANEOUSLY EVERY DAY. DISCARD PEN 56 DAYS AFTER OPENING?  insulin lispro, Humalog U-100 Insulin 100 unit/mL subcutaneous solution?  loperamide, Take 2 mg by mouth. (Patient not taking: Reported on 02/10/2020)?  mirtazapine, TAKE 1 TABLET BY MOUTH AT NIGHT EVERY OTHER DAY?  olmesartan, 40 mg, Oral, Daily?  polyethylene glycol-electrolytes, USE AS DIRECTED ONCE?  pravastatin, TAKE 1 TABLET BY MOUTH ONCE DAILY?  predniSONE, 6 mg. SOCIAL HISTORY: Ms. Proud  reports that she has never smoked. She does not have any smokeless tobacco history on file. She reports previous alcohol use. No history on file for drug use.FAMILY HISTORY: Her family history includes Breast cancer in her mother; Cervical cancer in her paternal grandmother; Stomach cancer in her brother.PHYSICAL EXAM:BP (!) 180/76 (Site: l a, Position: Sitting, Cuff Size: Large)  - Pulse 74  - Temp 97 ?F (36.1 ?C) (Temporal)  - Resp 20  - Wt 70.3 kg  - SpO2 98%  - BMI 28.52 kg/m? Physical ExamConstitutional:     Appearance: Normal appearance. HENT:    Head: Normocephalic and atraumatic.    Nose: Nose normal.    Mouth/Throat:    Mouth: Mucous membranes are moist.    Pharynx: Oropharynx  is clear. Eyes:    Extraocular Movements: Extraocular movements intact.    Conjunctiva/sclera: Conjunctivae normal.    Pupils: Pupils are equal, round, and reactive to light. Cardiovascular:    Rate and Rhythm: Normal rate and regular rhythm.    Pulses: Normal pulses.    Heart sounds: Normal heart sounds. Pulmonary:    Effort: Pulmonary effort is normal.    Breath sounds: Normal breath sounds. No wheezing. Abdominal:    General: Bowel sounds are normal.    Palpations: Abdomen is soft.    Tenderness: There is no abdominal tenderness. Musculoskeletal:       General: Normal range of motion.    Cervical back: Normal range of motion and neck supple. Lymphadenopathy:    Cervical: No cervical adenopathy. Skin:   General: Skin is warm and dry. Neurological:    General: No focal deficit present.    Mental Status: She is alert and oriented to person, place, and time. Psychiatric:       Mood and Affect: Mood normal.       Behavior: Behavior normal. DATA REVIEW:Lab Results Component Value Date  WBC 6.7 03/23/2020  HGB 9.6 (L) 03/23/2020  HCT 29.7 (L) 03/23/2020  MCV 104.2 (H) 03/23/2020  PLT 273 03/23/2020   Chemistry    Component Value Date/Time  NA 138 03/23/2020 1444  K 4.7 03/23/2020 1444  CL 96 (L) 03/23/2020 1444  CO2 28 03/23/2020 1444  BUN 33 (H) 03/23/2020 1444  CREATININE 5.94 (HH) 03/23/2020 1444  GLU 154 (H) 03/23/2020 1444  PROT 7.0 03/23/2020 1444    Component Value Date/Time  CALCIUM 10.5 (H) 03/23/2020 1444  ALKPHOS 95 03/23/2020 1444  AST 17 03/23/2020 1444  ALT 7 (L) 03/23/2020 1444  BILITOT 0.3 03/23/2020 1444  ALBUMIN 4.0 03/23/2020 1444  Lab Results Component Value Date IRON 71 03/23/2020  TIBC  03/23/2020    Comment:    Result not valid; Ferritin is >1200 ng/mL.  FERRITIN 1,745 (H) 03/23/2020 Lab Results Component Value Date  RETICCTPCT 2.63 03/23/2020 Lab Results Component Value Date  FOLATE 5.8 01/05/2020 Lab Results Component Value Date  VITAMINB12 >2,000 (H) 01/05/2020 IMPRESSION & PLAN: Rosine Solecki is a 69 y.o. female with anemia, secondary to kidney disease.Patient is feeling well, overall. Her performance status is well maintained.She is currently on lifelong Coumadin and is tolerating this well.We reviewed her recent blood work. Her blood count is stable.Her high blood pressure is likely a results of her kidney issues. I will review her bone marrow biopsy and call her with the results.Follow up in 3 months.Scribed for Denzil Magnuson, MD by Matilde Sprang, medical scribe, March 23, 2020.The documentation recorded by the scribe accurately reflects the services I personally performed and the decisions made by me. I reviewed and confirmed all material entered and/or pre-charted by the scribe.

## 2020-03-24 LAB — METHYLMALONIC ACID
BKR METHYLMALONIC ACID: 0.59 umol/L — ABNORMAL HIGH (ref 0.00–0.40)
BKR WAM RETICULOCYTE HGB EQUIVALENT: 0.59 umol/L — ABNORMAL HIGH (ref 0.00–0.40)

## 2020-04-10 ENCOUNTER — Encounter: Admit: 2020-04-10 | Payer: PRIVATE HEALTH INSURANCE | Attending: Vascular and Interventional Radiology

## 2020-04-11 ENCOUNTER — Encounter: Admit: 2020-04-11 | Payer: PRIVATE HEALTH INSURANCE | Attending: Vascular and Interventional Radiology

## 2020-04-12 ENCOUNTER — Encounter: Admit: 2020-04-12 | Payer: PRIVATE HEALTH INSURANCE | Attending: Vascular and Interventional Radiology

## 2020-04-14 ENCOUNTER — Encounter: Admit: 2020-04-14 | Payer: PRIVATE HEALTH INSURANCE | Attending: Vascular and Interventional Radiology

## 2020-04-16 ENCOUNTER — Encounter: Admit: 2020-04-16 | Payer: PRIVATE HEALTH INSURANCE | Attending: Vascular and Interventional Radiology

## 2020-04-17 ENCOUNTER — Telehealth: Admit: 2020-04-17 | Payer: PRIVATE HEALTH INSURANCE | Attending: Hematology & Oncology

## 2020-04-17 NOTE — Telephone Encounter
Pt needs a f/u visit with Dr. Daryel November she was released from the hospital. She can be reached [phone]

## 2020-04-19 NOTE — Telephone Encounter
done

## 2020-05-22 ENCOUNTER — Telehealth: Admit: 2020-05-22 | Payer: PRIVATE HEALTH INSURANCE | Attending: Hematology & Oncology

## 2020-05-22 ENCOUNTER — Encounter: Admit: 2020-05-22 | Payer: PRIVATE HEALTH INSURANCE | Attending: Hematology & Oncology

## 2020-05-22 ENCOUNTER — Encounter: Admit: 2020-05-22 | Payer: PRIVATE HEALTH INSURANCE | Attending: Vascular and Interventional Radiology

## 2020-05-22 ENCOUNTER — Ambulatory Visit: Admit: 2020-05-22 | Payer: BLUE CROSS/BLUE SHIELD | Attending: Hematology & Oncology

## 2020-05-22 ENCOUNTER — Ambulatory Visit: Admit: 2020-05-22 | Payer: PRIVATE HEALTH INSURANCE | Attending: Hematology & Oncology

## 2020-05-22 ENCOUNTER — Inpatient Hospital Stay: Admit: 2020-05-22 | Discharge: 2020-05-22 | Payer: BLUE CROSS/BLUE SHIELD

## 2020-05-22 DIAGNOSIS — D631 Anemia in chronic kidney disease: Secondary | ICD-10-CM

## 2020-05-22 DIAGNOSIS — N186 End stage renal disease: Secondary | ICD-10-CM

## 2020-05-22 DIAGNOSIS — Z992 Dependence on renal dialysis: Secondary | ICD-10-CM

## 2020-05-22 LAB — RETICULOCYTES
BKR WAM IRF: 19.2 % — ABNORMAL HIGH (ref 3.0–15.9)
BKR WAM RETICULOCYTE - ABS (3 DEC): 0.039 10??6 cells/uL (ref 0.023–0.140)
BKR WAM RETICULOCYTE COUNT PCT (1 DEC): 1.7 % (ref 0.6–2.7)
BKR WAM RETICULOCYTE HGB EQUIVALENT: 37 pg — ABNORMAL HIGH (ref 28.2–35.7)

## 2020-05-22 LAB — CBC WITH AUTO DIFFERENTIAL
BKR WAM ABSOLUTE IMMATURE GRANULOCYTES.: 0.02 x 1000/??L (ref 0.00–0.30)
BKR WAM ABSOLUTE LYMPHOCYTE COUNT.: 1.08 x 1000/??L (ref 0.60–3.70)
BKR WAM ABSOLUTE NRBC (2 DEC): 0 x 1000/??L (ref 0.00–1.00)
BKR WAM ANALYZER ANC: 2.55 x 1000/??L (ref 2.00–7.60)
BKR WAM BASOPHIL ABSOLUTE COUNT.: 0.03 x 1000/??L (ref 0.00–1.00)
BKR WAM BASOPHILS: 0.7 % (ref 0.0–1.4)
BKR WAM EOSINOPHIL ABSOLUTE COUNT.: 0.18 x 1000/??L (ref 0.00–1.00)
BKR WAM EOSINOPHILS: 4.5 % (ref 0.0–5.0)
BKR WAM HEMATOCRIT (2 DEC): 23.7 % — ABNORMAL LOW (ref 35.00–45.00)
BKR WAM HEMOGLOBIN: 7.8 g/dL — ABNORMAL LOW (ref 11.7–15.5)
BKR WAM IMMATURE GRANULOCYTES: 0.5 % (ref 0.0–1.0)
BKR WAM LYMPHOCYTES: 26.8 % (ref 17.0–50.0)
BKR WAM MCH (PG): 33.5 pg — ABNORMAL HIGH (ref 27.0–33.0)
BKR WAM MCHC: 32.9 g/dL (ref 31.0–36.0)
BKR WAM MCV: 101.7 fL — ABNORMAL HIGH (ref 80.0–100.0)
BKR WAM MONOCYTE ABSOLUTE COUNT.: 0.17 x 1000/??L (ref 0.00–1.00)
BKR WAM MONOCYTES: 4.2 % (ref 4.0–12.0)
BKR WAM MPV: 10.4 fL (ref 8.0–12.0)
BKR WAM NEUTROPHILS: 63.3 % (ref 39.0–72.0)
BKR WAM NUCLEATED RED BLOOD CELLS: 0 % (ref 0.0–1.0)
BKR WAM PLATELETS: 217 x1000/??L (ref 150–420)
BKR WAM RDW-CV: 20.1 % — ABNORMAL HIGH (ref 11.0–15.0)
BKR WAM RED BLOOD CELL COUNT.: 2.33 M/??L — ABNORMAL LOW (ref 4.00–6.00)
BKR WAM WHITE BLOOD CELL COUNT: 4 x1000/ÂµL (ref 4.0–11.0)

## 2020-05-22 LAB — COMPREHENSIVE METABOLIC PANEL
BKR A/G RATIO: 1.5 (ref 1.0–2.2)
BKR ALANINE AMINOTRANSFERASE (ALT): 12 U/L (ref 10–35)
BKR ALBUMIN: 4 g/dL (ref 3.6–4.9)
BKR ALKALINE PHOSPHATASE: 78 U/L (ref 9–122)
BKR ANION GAP: 17 (ref 7–17)
BKR ASPARTATE AMINOTRANSFERASE (AST): 18 U/L (ref 10–35)
BKR AST/ALT RATIO: 1.5
BKR BILIRUBIN TOTAL: 0.5 mg/dL (ref <=1.2)
BKR BLOOD UREA NITROGEN: 38 mg/dL — ABNORMAL HIGH (ref 8–23)
BKR BUN / CREAT RATIO: 4.6 — ABNORMAL LOW (ref 8.0–23.0)
BKR CALCIUM: 9.7 mg/dL (ref 8.8–10.2)
BKR CHLORIDE: 97 mmol/L — ABNORMAL LOW (ref 98–107)
BKR CO2: 23 mmol/L (ref 20–30)
BKR CREATININE: 8.32 mg/dL — CR (ref 0.40–1.30)
BKR EGFR (AFR AMER): 6 mL/min/1.73m2 (ref >60)
BKR EGFR (NON AFRICAN AMERICAN): 5 mL/min/1.73m2 (ref >60)
BKR GLUCOSE: 172 mg/dL — ABNORMAL HIGH (ref 70–100)
BKR POTASSIUM: 3.6 mmol/L (ref 3.3–5.1)
BKR PROTEIN TOTAL: 6.6 g/dL (ref 6.6–8.7)
BKR SODIUM: 137 mmol/L (ref 136–144)

## 2020-05-22 LAB — FERRITIN: BKR FERRITIN: 1818 ng/mL — ABNORMAL HIGH (ref 15–150)

## 2020-05-22 LAB — IRON AND TIBC: BKR IRON: 64 ug/dL (ref 37–145)

## 2020-05-22 NOTE — Telephone Encounter
Patient called to find out the time of her appt which is today at 2:30

## 2020-05-22 NOTE — Progress Notes
Pt with anemia due to CKD seen by Dr Daryel November.H/H 7.8/23.7Scheduled for transfusion of 2 units PRBC at [facility] on 12/2 at 8 am.Consent signed as Dr Daryel November has not ordered blood transfusion for her previously.Blood drawn for T & C and sent to [facility]. Pt banded.Orders faxed to [facility].

## 2020-05-23 LAB — FOLATE: BKR FOLATE: 12 ng/mL (ref 0.6–2.7)

## 2020-05-23 LAB — VITAMIN B12: BKR VITAMIN B12: >2000 pg/mL — ABNORMAL HIGH (ref 232–1245)

## 2020-05-23 NOTE — Progress Notes
Re: Lisa [name]: Dec 28, 1952MRN: [id number] Provider: Denzil Magnuson, MDDate: 11/29/2021REASON FOR VISIT:Chief Complaint Patient presents with ? Follow-up Visit REFERRING PROVIDER: Hollie Beach, PADIAGNOSIS:Encounter Diagnosis Name Primary? ? Anemia due to chronic kidney disease, on chronic dialysis (HC Code) (HC CODE) Yes HEMATOLOGY HISTORY:She sees a kidney specialist who give her IV iron. She is concerned that she is always anemic. She has had 2 clots in her same leg. The first in 2014 and the second in 2017. She was then placed on life-long anticoagulation, Coumadin.CURRENT TREATMENT:Aranesp during dialysisBlood transfusionECOG: 0HISTORY OF PRESENT ILLNESS: Lisa [name] is a 69 y.o. female who is presenting for a follow-up of anemia secondary to kidney disease.She reports not doing well due to having to go to the ED with complaints of abdominal pain on her left side. She was told that she has a issue with her pancreas and is set to follow up for this issue.She notes that her appetite is still not good and she is feeling tired an fatigued.REVIEW OF SYSTEMS:Review of Systems Constitutional: Positive for appetite change and fatigue. Negative for chills and fever. HENT: Negative for facial swelling and trouble swallowing.  Eyes: Negative for pain. Respiratory: Negative for shortness of breath.  Cardiovascular: Negative for leg swelling. Gastrointestinal: Positive for abdominal pain. Negative for constipation, diarrhea, nausea and vomiting. Skin: Negative for rash. Neurological: Negative for dizziness and headaches. Psychiatric/Behavioral: The patient is not nervous/anxious.  A comprehensive 12-point review of systems was queried and is otherwise negative.MEDICAL HISTORY:Past Medical History: Diagnosis Date ? Arthritis 02/09/2018 ? Bulging lumbar disc 02/09/2018 ? Deep vein thrombosis (DVT) of lower extremity (HC Code) 02/09/2018 ? Diabetes mellitus, type II (HC Code) 02/09/2018 ? ESRD (end stage renal disease) (HC Code) 02/09/2018 ? Exertional dyspnea 02/09/2018 ? Fibroids 02/09/2018 ? History of thoracotomy 02/09/2018 ? HTN (hypertension) 02/09/2018 ? Hx of tonsillectomy 02/09/2018 ? Hyperlipidemia 02/09/2018 ? Myasthenia gravis (HC Code) 02/09/2018 ? Obesity 02/09/2018 ? PVD (peripheral vascular disease) (HC Code) 03/26/2018 ? S/P TAH-BSO 02/09/2018 ? 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 amLODIPine (NORVASC) 10 mg tablet Take 10 mg by mouth daily.   ? apixaban (ELIQUIS) 2.5 mg tablet Take 2.5 mg by mouth.   ? azaTHIOprine (IMURAN) 50 mg tablet Take four 50 mg tablets (200 mg total) once daily.   ? carvediloL (COREG) 25 mg Immediate Release tablet TAKE 1 TABLET BY MOUTH TWICE DAILY WITH MEALS   ? cyanocobalamin, vitamin B-12, 2,500 mcg Subl DISSOLVE 1 TABLET ONCE DAILY IN THE MORNING FOR 90 DAYS   ? famotidine (PEPCID) 20 mg tablet TAKE 1 TABLET BY MOUTH TWICE DAILY (Patient not taking: Reported on 02/10/2020)   ? furosemide (LASIX) 80 mg tablet Take 80 mg by mouth.   ? insulin degludec (TRESIBA FLEXTOUCH U-200 INSULIN) 200 unit/mL (3 mL) pen INJECT 4 UNITS SUBCUTANEOUSLY EVERY DAY. DISCARD PEN 56 DAYS AFTER OPENING   ? insulin lispro (HUMALOG U-100 INSULIN) 100 unit/mL vial Humalog U-100 Insulin 100 unit/mL subcutaneous solution   ? loperamide (IMODIUM) 2 mg capsule Take 2 mg by mouth. (Patient not taking: Reported on 02/10/2020)   ? mirtazapine (REMERON) 15 mg tablet TAKE 1 TABLET BY MOUTH AT NIGHT EVERY OTHER DAY   ? olmesartan (BENICAR) 40 MG tablet Take 40 mg by mouth daily.   ? polyethylene glycol-electrolytes (NULYTELY) 420 gram solution USE AS DIRECTED ONCE   ? pravastatin (PRAVACHOL) 80 mg tablet TAKE 1 TABLET BY MOUTH ONCE DAILY   ? predniSONE (DELTASONE) 5 mg tablet 6 mg.  No current facility-administered medications for this visit. SURGICAL HISTORY: No past surgical history on file.SOCIAL HISTORY:Social History Socioeconomic History ? Marital status: Single   Spouse name: Not on file ? Number of children: Not on file ? Years of education: Not on file ? Highest education level: Not on file Tobacco Use ? Smoking status: Never Smoker Substance and Sexual Activity ? Alcohol use: Not Currently Social Determinants of Health Financial Resource Strain:  ? Difficulty of Paying Living Expenses:  Food Insecurity:  ? Worried About Programme researcher, broadcasting/film/video in the Last Year:  ? Barista in the Last Year:  Transportation Needs:  ? Freight forwarder (Medical):  ? Lack of Transportation (Non-Medical):  Physical Activity:  ? Days of Exercise per Week:  ? Minutes of Exercise per Session:  Stress:  ? Feeling of Stress :  Social Connections:  ? Frequency of Communication with Friends and Family:  ? Frequency of Social Gatherings with Friends and Family:  ? Attends Religious Services:  ? Active Member of Clubs or Organizations:  ? Attends Banker Meetings:  ? Marital Status:  Intimate Partner Violence:  ? Fear of Current or Ex-Partner:  ? Emotionally Abused:  ? Physically Abused:  ? Sexually Abused:  FAMILY HISTORY:Family History Problem Relation Age of Onset ? Breast cancer Mother  ? Stomach cancer Brother  ? Cervical cancer Paternal Grandmother  PHYSICAL EXAM: BP 130/60 (Site: l a, Position: Sitting, Cuff Size: Medium)  - Pulse 85  - Temp 97.6 ?F (36.4 ?C) (Temporal)  - Resp 20  - Wt 64.2 kg  - SpO2 100%  - BMI 26.05 kg/m?  Physical ExamConstitutional:     Appearance: Normal appearance. HENT:    Head: Normocephalic and atraumatic.    Nose: Nose normal.    Mouth/Throat:    Mouth: Mucous membranes are moist.    Pharynx: Oropharynx is clear. Eyes: Extraocular Movements: Extraocular movements intact.    Conjunctiva/sclera: Conjunctivae normal.    Pupils: Pupils are equal, round, and reactive to light. Cardiovascular:    Rate and Rhythm: Normal rate and regular rhythm.    Pulses: Normal pulses.    Heart sounds: Normal heart sounds. Pulmonary:    Effort: Pulmonary effort is normal.    Breath sounds: Normal breath sounds. No wheezing. Abdominal:    General: Bowel sounds are normal.    Palpations: Abdomen is soft.    Tenderness: There is no abdominal tenderness. Musculoskeletal:       General: Normal range of motion.    Cervical back: Normal range of motion and neck supple. Lymphadenopathy:    Cervical: No cervical adenopathy. Skin:   General: Skin is warm and dry. Neurological:    General: No focal deficit present.    Mental Status: She is alert and oriented to person, place, and time. Psychiatric:       Mood and Affect: Mood normal.       Behavior: Behavior normal. DATA REVIEW:Lab Results Component Value Date  WBC 4.0 05/22/2020  HGB 7.8 (L) 05/22/2020  HCT 23.70 (L) 05/22/2020  MCV 101.7 (H) 05/22/2020  PLT 217 05/22/2020   Chemistry    Component Value Date/Time  NA 138 03/23/2020 1444  K 4.7 03/23/2020 1444  CL 96 (L) 03/23/2020 1444  CO2 28 03/23/2020 1444  BUN 33 (H) 03/23/2020 1444  CREATININE 5.94 (HH) 03/23/2020 1444  GLU 154 (H) 03/23/2020 1444  PROT 7.0 03/23/2020 1444    Component Value Date/Time  CALCIUM 10.5 (H) 03/23/2020 1444  ALKPHOS 95 03/23/2020 1444  AST 17 03/23/2020 1444  ALT 7 (L) 03/23/2020 1444  BILITOT 0.3 03/23/2020 1444  ALBUMIN 4.0 03/23/2020 1444  Lab Results Component Value Date  IRON 71 03/23/2020  TIBC  03/23/2020    Comment:    Result not valid; Ferritin is >1200 ng/mL.  FERRITIN 1,745 (H) 03/23/2020 Lab Results Component Value Date  RETICCTPCT 1.7 05/22/2020 Lab Results Component Value Date  FOLATE 5.8 01/05/2020 Lab Results Component Value Date  VITAMINB12 >2,000 (H) 01/05/2020 IMPRESSION & PLAN:Lashunta Pokorney is a 69 y.o. female with anemia secondary to kidney disease.Patient is not feeling well. She is feeling fatigued and has been dealing with a new issue with her pancreas found during an recent ER admission.  I have reviewed the results of the Hagerman scan.  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  She is scheduled for a workup with Gastroenterology.  She will have an endoscopy and an endoscopic ultrasound examination.She is currently receiving Aranesp during her dialysis.We reviewed her recent blood work. Her hemoglobin and hematocrit are low and thus she needs a blood transfusion. We will set her up for 2 units of blood at [facility].Follow up in January 2022.Scribed for Denzil Magnuson, MD by Matilde Sprang, medical scribe, May 22, 2020.The documentation recorded by the scribe accurately reflects the services I personally performed and the decisions made by me. I reviewed and confirmed all material entered and/or pre-charted by the scribe.

## 2020-06-27 ENCOUNTER — Encounter: Admit: 2020-06-27 | Payer: PRIVATE HEALTH INSURANCE | Attending: Family

## 2020-06-27 DIAGNOSIS — I824Y9 Acute embolism and thrombosis of unspecified deep veins of unspecified proximal lower extremity: Secondary | ICD-10-CM

## 2020-06-27 DIAGNOSIS — D508 Other iron deficiency anemias: Secondary | ICD-10-CM

## 2020-06-27 DIAGNOSIS — N186 End stage renal disease: Secondary | ICD-10-CM

## 2020-06-28 ENCOUNTER — Telehealth: Admit: 2020-06-28 | Payer: PRIVATE HEALTH INSURANCE | Attending: Hematology & Oncology

## 2020-06-28 NOTE — Telephone Encounter
Please resch appointment for 1/6 to a phone visit and a lab appointment for tomorrow at 1pm/ pt aware

## 2020-06-29 ENCOUNTER — Ambulatory Visit: Admit: 2020-06-29 | Payer: PRIVATE HEALTH INSURANCE | Attending: Family

## 2020-06-29 ENCOUNTER — Inpatient Hospital Stay: Admit: 2020-06-29 | Discharge: 2020-06-29 | Payer: BLUE CROSS/BLUE SHIELD

## 2020-06-29 ENCOUNTER — Ambulatory Visit: Admit: 2020-06-29 | Payer: BLUE CROSS/BLUE SHIELD | Attending: Family

## 2020-06-29 ENCOUNTER — Telehealth: Admit: 2020-06-29 | Payer: PRIVATE HEALTH INSURANCE | Attending: Hematology

## 2020-06-29 ENCOUNTER — Ambulatory Visit: Admit: 2020-06-29 | Payer: PRIVATE HEALTH INSURANCE

## 2020-06-29 ENCOUNTER — Encounter: Admit: 2020-06-29 | Payer: PRIVATE HEALTH INSURANCE | Attending: Family

## 2020-06-29 DIAGNOSIS — I824Y9 Acute embolism and thrombosis of unspecified deep veins of unspecified proximal lower extremity: Secondary | ICD-10-CM

## 2020-06-29 DIAGNOSIS — D508 Other iron deficiency anemias: Secondary | ICD-10-CM

## 2020-06-29 DIAGNOSIS — N186 End stage renal disease: Secondary | ICD-10-CM

## 2020-06-29 DIAGNOSIS — D631 Anemia in chronic kidney disease: Secondary | ICD-10-CM

## 2020-06-29 DIAGNOSIS — Z992 Dependence on renal dialysis: Secondary | ICD-10-CM

## 2020-06-29 LAB — COMPREHENSIVE METABOLIC PANEL
BKR A/G RATIO: 1.6 x 1000/??L (ref 1.0–2.2)
BKR ALBUMIN: 3.9 g/dL (ref 3.6–4.9)
BKR ALKALINE PHOSPHATASE: 68 U/L (ref 9–122)
BKR ANION GAP: 11 pg (ref 7–17)
BKR ASPARTATE AMINOTRANSFERASE (AST): 10 U/L (ref 10–35)
BKR AST/ALT RATIO: 0.24 x 1000/??L (ref 0.00–1.00)
BKR BILIRUBIN TOTAL: 0.5 mg/dL (ref <=1.2)
BKR BLOOD UREA NITROGEN: 12 mg/dL (ref 8–23)
BKR BUN / CREAT RATIO: 2.9 % — ABNORMAL LOW (ref 8.0–23.0)
BKR CALCIUM: 10 mg/dL (ref 8.8–10.2)
BKR CHLORIDE: 102 mmol/L — ABNORMAL LOW (ref 98–107)
BKR CO2: 30 mmol/L (ref 20–30)
BKR CREATININE: 4.21 mg/dL — ABNORMAL HIGH (ref 0.40–1.30)
BKR EGFR (AFR AMER): 13 mL/min/{1.73_m2} (ref >60)
BKR EGFR (NON AFRICAN AMERICAN): 11 mL/min/{1.73_m2} (ref >60)
BKR GLOBULIN: 2.5 g/dL (ref 2.3–3.5)
BKR GLUCOSE: 151 mg/dL — ABNORMAL HIGH (ref 70–100)
BKR POTASSIUM: 4.2 mmol/L (ref 3.3–5.1)
BKR PROTEIN TOTAL: 6.4 g/dL — ABNORMAL LOW (ref 6.6–8.7)
BKR SODIUM: 143 mmol/L — ABNORMAL LOW (ref 136–144)

## 2020-06-29 LAB — CBC WITH AUTO DIFFERENTIAL
BKR ALANINE AMINOTRANSFERASE (ALT): 0.7 % — ABNORMAL LOW (ref 0.0–1.0)
BKR WAM ABSOLUTE IMMATURE GRANULOCYTES.: 0.03 x 1000/??L (ref 0.00–0.30)
BKR WAM ABSOLUTE LYMPHOCYTE COUNT.: 0.94 x 1000/ÂµL (ref 0.60–3.70)
BKR WAM ABSOLUTE NRBC (2 DEC): 0 x 1000/??L (ref 0.00–1.00)
BKR WAM ANALYZER ANC: 2.78 x 1000/??L (ref 2.00–7.60)
BKR WAM BASOPHIL ABSOLUTE COUNT.: 0.06 x 1000/??L (ref 0.00–1.00)
BKR WAM BASOPHILS: 1.4 % (ref 0.0–1.4)
BKR WAM EOSINOPHIL ABSOLUTE COUNT.: 0.19 x 1000/??L (ref 0.00–1.00)
BKR WAM EOSINOPHILS: 4.5 % (ref 0.0–5.0)
BKR WAM HEMATOCRIT (2 DEC): 28.3 % — ABNORMAL LOW (ref 35.00–45.00)
BKR WAM HEMOGLOBIN: 8.9 g/dL — ABNORMAL LOW (ref 11.7–15.5)
BKR WAM IMMATURE GRANULOCYTES: 0.7 % (ref 0.0–1.0)
BKR WAM LYMPHOCYTES: 22.2 % — ABNORMAL LOW (ref 17.0–50.0)
BKR WAM MCH (PG): 30.7 pg (ref 27.0–33.0)
BKR WAM MCHC: 31.4 g/dL (ref 31.0–36.0)
BKR WAM MCV: 97.6 fL (ref 80.0–100.0)
BKR WAM MONOCYTE ABSOLUTE COUNT.: 0.24 x 1000/??L (ref 0.00–1.00)
BKR WAM MONOCYTES: 5.7 % (ref 4.0–12.0)
BKR WAM MPV: 11.4 fL (ref 8.0–12.0)
BKR WAM NEUTROPHILS: 65.5 % (ref 39.0–72.0)
BKR WAM NUCLEATED RED BLOOD CELLS: 0 % (ref 0.0–1.0)
BKR WAM PLATELETS: 148 x1000/??L — ABNORMAL LOW (ref 150–420)
BKR WAM RDW-CV: 22.8 % — ABNORMAL HIGH (ref 11.0–15.0)
BKR WAM RED BLOOD CELL COUNT.: 2.9 M/??L — ABNORMAL LOW (ref 4.00–6.00)
BKR WAM WHITE BLOOD CELL COUNT: 4.2 x1000/??L (ref 4.0–11.0)

## 2020-06-29 LAB — IRON AND TIBC
BKR IRON: 89 ug/dL (ref 37–145)
BKR TOTAL IRON BINDING CAPACITY: <1 /HPF (ref 0–3)

## 2020-06-29 LAB — FERRITIN: BKR FERRITIN: 2206 ng/mL — ABNORMAL HIGH (ref 15–150)

## 2020-06-29 NOTE — Telephone Encounter
Please schedule lab appointment for today 1/6 @1pm 

## 2020-07-11 ENCOUNTER — Encounter: Admit: 2020-07-11 | Payer: PRIVATE HEALTH INSURANCE | Attending: Hematology & Oncology

## 2020-07-11 ENCOUNTER — Ambulatory Visit: Admit: 2020-07-11 | Payer: BLUE CROSS/BLUE SHIELD | Attending: Family

## 2020-07-11 ENCOUNTER — Inpatient Hospital Stay: Admit: 2020-07-11 | Discharge: 2020-07-11 | Payer: BLUE CROSS/BLUE SHIELD

## 2020-07-11 DIAGNOSIS — E119 Type 2 diabetes mellitus without complications: Secondary | ICD-10-CM

## 2020-07-11 DIAGNOSIS — D508 Other iron deficiency anemias: Secondary | ICD-10-CM

## 2020-07-11 DIAGNOSIS — N186 End stage renal disease: Secondary | ICD-10-CM

## 2020-07-11 DIAGNOSIS — Z992 Dependence on renal dialysis: Secondary | ICD-10-CM

## 2020-07-11 DIAGNOSIS — D631 Anemia in chronic kidney disease: Secondary | ICD-10-CM

## 2020-07-11 DIAGNOSIS — I824Y9 Acute embolism and thrombosis of unspecified deep veins of unspecified proximal lower extremity: Secondary | ICD-10-CM

## 2020-07-11 LAB — CBC WITH AUTO DIFFERENTIAL
BKR WAM ABSOLUTE IMMATURE GRANULOCYTES.: 0.08 x 1000/??L (ref 0.00–0.30)
BKR WAM ABSOLUTE LYMPHOCYTE COUNT.: 0.59 x 1000/??L — ABNORMAL LOW (ref 0.60–3.70)
BKR WAM ABSOLUTE NRBC (2 DEC): 0 x 1000/??L (ref 0.00–1.00)
BKR WAM ANALYZER ANC: 2.88 x 1000/??L (ref 2.00–7.60)
BKR WAM BASOPHIL ABSOLUTE COUNT.: 0.03 x 1000/??L (ref 0.00–1.00)
BKR WAM BASOPHILS: 0.8 % (ref 0.0–1.4)
BKR WAM EOSINOPHIL ABSOLUTE COUNT.: 0.15 x 1000/??L (ref 0.00–1.00)
BKR WAM EOSINOPHILS: 3.9 % (ref 0.0–5.0)
BKR WAM HEMATOCRIT (2 DEC): 27 % — ABNORMAL LOW (ref 35.00–45.00)
BKR WAM HEMOGLOBIN: 8.6 g/dL — ABNORMAL LOW (ref 11.7–15.5)
BKR WAM IMMATURE GRANULOCYTES: 2.1 % — ABNORMAL HIGH (ref 0.0–1.0)
BKR WAM LYMPHOCYTES: 15.2 % — ABNORMAL LOW (ref 17.0–50.0)
BKR WAM MCH (PG): 30.6 pg (ref 27.0–33.0)
BKR WAM MCHC: 31.9 g/dL (ref 31.0–36.0)
BKR WAM MCV: 96.1 fL (ref 80.0–100.0)
BKR WAM MONOCYTE ABSOLUTE COUNT.: 0.16 x 1000/??L (ref 0.00–1.00)
BKR WAM MONOCYTES: 4.1 % (ref 4.0–12.0)
BKR WAM MPV: 11 fL (ref 8.0–12.0)
BKR WAM NEUTROPHILS: 73.9 % — ABNORMAL HIGH (ref 39.0–72.0)
BKR WAM NUCLEATED RED BLOOD CELLS: 0.5 % (ref 0.0–1.0)
BKR WAM PLATELETS: 222 x1000/??L (ref 150–420)
BKR WAM RDW-CV: 24.3 % — ABNORMAL HIGH (ref 11.0–15.0)
BKR WAM RED BLOOD CELL COUNT.: 2.81 M/??L — ABNORMAL LOW (ref 4.00–6.00)
BKR WAM WHITE BLOOD CELL COUNT: 3.9 x1000/??L — ABNORMAL LOW (ref 4.0–11.0)

## 2020-07-11 LAB — COMPREHENSIVE METABOLIC PANEL
BKR ALANINE AMINOTRANSFERASE (ALT): 5 U/L — ABNORMAL LOW (ref 10–35)
BKR ALBUMIN: 4.1 g/dL (ref 3.6–4.9)
BKR ALKALINE PHOSPHATASE: 67 U/L — ABNORMAL LOW (ref 9–122)
BKR ANION GAP: 14 (ref 7–17)
BKR ASPARTATE AMINOTRANSFERASE (AST): 14 U/L — ABNORMAL LOW (ref 10–35)
BKR BILIRUBIN TOTAL: 0.6 mg/dL (ref <=1.2)
BKR BLOOD UREA NITROGEN: 19 mg/dL (ref 8–23)
BKR BUN / CREAT RATIO: 3.9 — ABNORMAL LOW (ref 8.0–23.0)
BKR CALCIUM: 10.2 mg/dL (ref 8.8–10.2)
BKR CHLORIDE: 100 mmol/L (ref 98–107)
BKR CO2: 24 mmol/L (ref 20–30)
BKR CREATININE: 4.84 mg/dL — ABNORMAL HIGH (ref 0.40–1.30)
BKR EGFR (AFR AMER): 11 mL/min/{1.73_m2} — ABNORMAL HIGH (ref >60)
BKR EGFR (NON AFRICAN AMERICAN): 9 mL/min/{1.73_m2} (ref >60)
BKR GLOBULIN: 2.6 g/dL — ABNORMAL HIGH (ref 2.3–3.5)
BKR GLUCOSE: 173 mg/dL — ABNORMAL HIGH (ref 70–100)
BKR LACTATE DEHYDROGENASE: 3.9 U/L — ABNORMAL LOW (ref 8.0–23.0)
BKR POTASSIUM: 4.3 mmol/L (ref 3.3–5.1)
BKR PROTEIN TOTAL: 6.7 g/dL (ref 6.6–8.7)
BKR SODIUM: 138 mmol/L (ref 136–144)

## 2020-07-11 MED ORDER — CALCIUM CARBONATE 600 MG-VITAMIN D3 10 MCG (400 UNIT) TABLET
1500 mg (600 mg calcium) - 400 unit | Status: AC
Start: 2020-07-11 — End: 2020-07-11

## 2020-07-11 MED ORDER — PANTOPRAZOLE 40 MG TABLET,DELAYED RELEASE
40 mg | Status: AC
Start: 2020-07-11 — End: 2020-07-11

## 2020-07-11 MED ORDER — ALPRAZOLAM 0.25 MG TABLET
0.25 mg | Status: AC
Start: 2020-07-11 — End: 2020-07-11

## 2020-07-11 MED ORDER — POLYETHYLENE GLYCOL ORAL POWDER (BOWEL PREP)
17 gram/dose | ORAL | Status: AC
Start: 2020-07-11 — End: 2020-07-11

## 2020-07-11 MED ORDER — PENTOXIFYLLINE ER 400 MG TABLET,EXTENDED RELEASE
400 mg | ORAL | Status: AC
Start: 2020-07-11 — End: 2020-07-11

## 2020-07-11 MED ORDER — SERTRALINE 25 MG TABLET
25 mg | Status: AC
Start: 2020-07-11 — End: 2020-08-24

## 2020-07-11 MED ORDER — DOXAZOSIN 4 MG TABLET
4 mg | Status: AC
Start: 2020-07-11 — End: ?

## 2020-07-11 MED ORDER — DICYCLOMINE 10 MG CAPSULE
10 mg | ORAL | Status: AC
Start: 2020-07-11 — End: 2020-07-11

## 2020-07-11 MED ORDER — LABETALOL 300 MG TABLET
300 mg | Freq: Two times a day (BID) | Status: AC
Start: 2020-07-11 — End: 2020-07-11

## 2020-07-11 MED ORDER — SPIRONOLACTONE 25 MG TABLET
25 mg | Status: AC
Start: 2020-07-11 — End: 2020-07-11

## 2020-07-11 MED ORDER — PREDNISONE 1 MG TABLET
1 mg | Status: AC
Start: 2020-07-11 — End: ?

## 2020-07-11 MED ORDER — FLUOXETINE 20 MG TABLET
20 mg | Status: AC
Start: 2020-07-11 — End: 2021-05-03

## 2020-07-11 MED ORDER — FUROSEMIDE 40 MG TABLET
40 mg | Status: AC
Start: 2020-07-11 — End: ?

## 2020-07-11 MED ORDER — CYCLOBENZAPRINE 5 MG TABLET
5 mg | ORAL | Status: AC
Start: 2020-07-11 — End: 2020-07-11

## 2020-07-11 MED ORDER — GABAPENTIN 100 MG CAPSULE
100 mg | Status: AC
Start: 2020-07-11 — End: 2020-07-11

## 2020-07-11 NOTE — Progress Notes
TELEPHONE VISIT: For this visit the clinician and patient were present via telephone (audio only).Patient counseled on available options for visit type; Patient elected telephone visit; Patient consent given for telephone visit: YesPatient Identity was confirmed during this call.  Other individuals actively participating in the telephone encounter and their name/relation to the patient: noneTotal time spent in medical telephone consultation: 25 minBecause this visit was completed over telephone, a hands-on physical exam was not performed.  Patient understands and knows to call back if condition changes. The visit type for this patient required modifications due to the COVID-19 outbreak.HEMATOLOGY CLINIC FOLLOW UP NOTELouise [name] [id number] Provider: Ferd Glassing, APRNDate of service: 1/18/2022HEMATOLOGIC DIAGNOSIS: Anemia of unclear etiologyINTERIM HISTORY: Lisa [name] has no energy. 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 Her GI doctor decided not to get a specimen of her pancreas due to the difficulty of the location and likely complications. She continues to go to dialysis MWF; she is receiving IV iron and ESA at dialysis.  No shortness of breath, chest pain or heart palpitations.  Occasionally dizziness/lightheadedness if she gets up too fast. No headaches. Poor appetite; no nausea or vomiting. Her stools wax and wane between diarrhea and constipation.  She continues on her Eliquis 2.5 mg twice daily; no bleeding complications.  Her last colonoscopy was on 10/06/2019.No easy bruising, mucosal bleeding, epistaxis, hematemesis, hemoptysis, melena or hematuria. No fevers, chills, night sweats, headaches, vision changes, chest pain, heart palpitations, nausea, vomiting, urinary issues, lower extremity edema, numbness, tingling or skin rash. REVIEW OF SYSTEMS:Pertinent positives and negatives as per HPI. The remainder of the 12-point ROS were unremarkable. PHYSICAL EXAM:Not performed due to telephone visit during pandemic. MEDICATIONS:Current Outpatient Medications Medication Sig ? apixaban (ELIQUIS) 2.5 mg tablet Take 2.5 mg by mouth. ? azaTHIOprine (IMURAN) 50 mg tablet Take four 50 mg tablets (200 mg total) once daily. ? carvediloL (COREG) 25 mg Immediate Release tablet TAKE 1 TABLET BY MOUTH TWICE DAILY WITH MEALS ? cyanocobalamin, vitamin B-12, 2,500 mcg Subl DISSOLVE 1 TABLET ONCE DAILY IN THE MORNING FOR 90 DAYS ? doxazosin (CARDURA) 4 mg tablet TAKE 1 TABLET BY MOUTH ONCE DAILY AT NIGHT ? FLUoxetine 20 mg tablet daily. ? furosemide (LASIX) 40 mg tablet furosemide 40 mg tablet 1 tablet twice a day ? insulin degludec (TRESIBA FLEXTOUCH U-200 INSULIN) 200 unit/mL (3 mL) pen INJECT 4 UNITS SUBCUTANEOUSLY EVERY DAY. DISCARD PEN 56 DAYS AFTER OPENING ? loperamide (IMODIUM) 2 mg capsule Take 2 mg by mouth.  ? olmesartan (BENICAR) 40 MG tablet Take 40 mg by mouth daily. ? pravastatin (PRAVACHOL) 80 mg tablet TAKE 1 TABLET BY MOUTH ONCE DAILY ? predniSONE (DELTASONE) 1 mg tablet daily. ? sertraline (ZOLOFT) 25 mg tablet TAKE 1 TABLET BY MOUTH ONCE DAILY No current facility-administered medications for this visit. LABS:Results for Lisa, [name] (MRN [id number]) as of 07/11/2020 17:09 Ref. Range 07/11/2020 15:17 Sodium Latest Ref Range: 136 - 144 mmol/L 138 Potassium Latest Ref Range: 3.3 - 5.1 mmol/L 4.3 Chloride Latest Ref Range: 98 - 107 mmol/L 100 CO2 Latest Ref Range: 20 - 30 mmol/L 24 Anion Gap Latest Ref Range: 7 - 17  14 BUN Latest Ref Range: 8 - 23 mg/dL 19 Creatinine Latest Ref Range: 0.40 - 1.30 mg/dL 7.82 (H) BUN/Creatinine Ratio Latest Ref Range: 8.0 - 23.0  3.9 (L) eGFR (NON African-American) Latest Ref Range: >60 mL/min/1.79m2 9 eGFR (Afr Amer) Latest Ref Range: >60 mL/min/1.58m2 11 Glucose Latest Ref Range: 70 - 100 mg/dL 956 (H) Calcium Latest Ref Range:  8.8 - 10.2 mg/dL 09.8 Total Bilirubin Latest Ref Range: <=1.2 mg/dL 0.6 Alkaline Phosphatase Latest Ref Range: 9 - 122 U/L 67 Alanine Aminotransferase (ALT) Latest Ref Range: 10 - 35 U/L 5 (L) Aspartate Aminotransferase (AST) Latest Ref Range: 10 - 35 U/L 14 AST/ALT Ratio Latest Ref Range: See Comment  2.8 Total Protein Latest Ref Range: 6.6 - 8.7 g/dL 6.7 Albumin Latest Ref Range: 3.6 - 4.9 g/dL 4.1 Globulin Latest Ref Range: 2.3 - 3.5 g/dL 2.6 A/G Ratio Latest Ref Range: 1.0 - 2.2  1.6 WBC Latest Ref Range: 4.0 - 11.0 x1000/?L 3.9 (L) RBC Latest Ref Range: 4.00 - 6.00 M/?L 2.81 (L) Hemoglobin Latest Ref Range: 11.7 - 15.5 g/dL 8.6 (L) Hematocrit Latest Ref Range: 35.00 - 45.00 % 27.00 (L) MCV Latest Ref Range: 80.0 - 100.0 fL 96.1 MCH Latest Ref Range: 27.0 - 33.0 pg 30.6 MCHC Latest Ref Range: 31.0 - 36.0 g/dL 11.9 RDW-CV Latest Ref Range: 11.0 - 15.0 % 24.3 (H) Platelets Latest Ref Range: 150 - 420 x1000/?L 222 MPV Latest Ref Range: 8.0 - 12.0 fL 11.0 Neutrophils Latest Ref Range: 39.0 - 72.0 % 73.9 (H) Lymphocytes Latest Ref Range: 17.0 - 50.0 % 15.2 (L) Monocytes Latest Ref Range: 4.0 - 12.0 % 4.1 Eosinophils Latest Ref Range: 0.0 - 5.0 % 3.9 Basophils Latest Ref Range: 0.0 - 1.4 % 0.8 Immature Granulocytes Latest Ref Range: 0.0 - 1.0 % 2.1 (H) nRBC Latest Ref Range: 0.0 - 1.0 % 0.5 ANC (Abs Neutrophil Count) Latest Ref Range: 2.00 - 7.60 x 1000/?L 2.88 Absolute Lymphocyte Count Latest Ref Range: 0.60 - 3.70 x 1000/?L 0.59 (L) Monocytes (Absolute) Latest Ref Range: 0.00 - 1.00 x 1000/?L 0.16 Eosinophil Absolute Count Latest Ref Range: 0.00 - 1.00 x 1000/?L 0.15 Basophils Absolute Latest Ref Range: 0.00 - 1.00 x 1000/?L 0.03 Immature Granulocytes (Abs) Latest Ref Range: 0.00 - 0.30 x 1000/?L 0.08 nRBC Absolute Latest Ref Range: 0.00 - 1.00 x 1000/?L 0.00 ASSESSMENT/PLAN:Lisa [name] is a 70 year old female with history of 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 Pt reports that no biopsy has been done due to the location and possibly complications; GI team is monitoring. Patient here for follow-up visit for anemia of unknown etiology.  Anemia, unclear etiology:Hemoglobin 8.6 and MCV 96.1. We will set her up for 1 unit of pRBC at [facility]. Last blood transfusion x 2 units in 04/2020. Pt currently on dialysis MWF; receives IV iron and aranesp with dialysis. Cancer screenings:Colonoscopy on 10/06/2019. Mammogram on 02/06/2018. RTC: 2 months for labs/Dr. Lorrene Reid spent 35 minutes in direct contact with patient, of which at least half was spent in counseling and coordination of care.

## 2020-07-12 ENCOUNTER — Telehealth: Admit: 2020-07-12 | Payer: PRIVATE HEALTH INSURANCE | Attending: Family

## 2020-07-12 NOTE — Telephone Encounter
Called pt to discuss that due to blood shortage will not transfuse blood at this time. [facility] is only transfusing pts if hgb <7. Will schedule appt with labs in 6 weeks to recheck anemia. Advised pt to call if increase in fatigue or shortness of breath and will obtain labs sooner. Pt verbalized understanding; no other questions or concerns at this time. Electronically Signed by Phoebe Perch, APRN, July 12, 2020

## 2020-07-25 ENCOUNTER — Telehealth: Admit: 2020-07-25 | Payer: PRIVATE HEALTH INSURANCE | Attending: Hematology & Oncology

## 2020-07-25 NOTE — Telephone Encounter
Pt called to confirm appt 3/10

## 2020-08-23 ENCOUNTER — Encounter: Admit: 2020-08-23 | Payer: PRIVATE HEALTH INSURANCE | Attending: Family

## 2020-08-23 DIAGNOSIS — N186 End stage renal disease: Secondary | ICD-10-CM

## 2020-08-24 ENCOUNTER — Encounter: Admit: 2020-08-24 | Payer: PRIVATE HEALTH INSURANCE | Attending: Family

## 2020-08-24 ENCOUNTER — Ambulatory Visit: Admit: 2020-08-24 | Payer: BLUE CROSS/BLUE SHIELD | Attending: Family

## 2020-08-24 ENCOUNTER — Inpatient Hospital Stay: Admit: 2020-08-24 | Discharge: 2020-08-24 | Payer: BLUE CROSS/BLUE SHIELD

## 2020-08-24 ENCOUNTER — Ambulatory Visit: Admit: 2020-08-24 | Payer: PRIVATE HEALTH INSURANCE | Attending: Family

## 2020-08-24 DIAGNOSIS — I10 Essential (primary) hypertension: Secondary | ICD-10-CM

## 2020-08-24 DIAGNOSIS — G5700 Lesion of sciatic nerve, unspecified lower limb: Secondary | ICD-10-CM

## 2020-08-24 DIAGNOSIS — N186 End stage renal disease: Secondary | ICD-10-CM

## 2020-08-24 DIAGNOSIS — E669 Obesity, unspecified: Secondary | ICD-10-CM

## 2020-08-24 DIAGNOSIS — Z9089 Acquired absence of other organs: Secondary | ICD-10-CM

## 2020-08-24 DIAGNOSIS — I739 Peripheral vascular disease, unspecified: Secondary | ICD-10-CM

## 2020-08-24 DIAGNOSIS — Z992 Dependence on renal dialysis: Secondary | ICD-10-CM

## 2020-08-24 DIAGNOSIS — I824Y9 Acute embolism and thrombosis of unspecified deep veins of unspecified proximal lower extremity: Secondary | ICD-10-CM

## 2020-08-24 DIAGNOSIS — D508 Other iron deficiency anemias: Secondary | ICD-10-CM

## 2020-08-24 DIAGNOSIS — D631 Anemia in chronic kidney disease: Secondary | ICD-10-CM

## 2020-08-24 DIAGNOSIS — I82409 Acute embolism and thrombosis of unspecified deep veins of unspecified lower extremity: Secondary | ICD-10-CM

## 2020-08-24 DIAGNOSIS — M1990 Unspecified osteoarthritis, unspecified site: Secondary | ICD-10-CM

## 2020-08-24 DIAGNOSIS — Z9889 Other specified postprocedural states: Secondary | ICD-10-CM

## 2020-08-24 DIAGNOSIS — E119 Type 2 diabetes mellitus without complications: Secondary | ICD-10-CM

## 2020-08-24 DIAGNOSIS — R0609 Other forms of dyspnea: Secondary | ICD-10-CM

## 2020-08-24 DIAGNOSIS — E785 Hyperlipidemia, unspecified: Secondary | ICD-10-CM

## 2020-08-24 DIAGNOSIS — D219 Benign neoplasm of connective and other soft tissue, unspecified: Secondary | ICD-10-CM

## 2020-08-24 DIAGNOSIS — Z90710 Acquired absence of both cervix and uterus: Secondary | ICD-10-CM

## 2020-08-24 DIAGNOSIS — M5136 Other intervertebral disc degeneration, lumbar region: Secondary | ICD-10-CM

## 2020-08-24 DIAGNOSIS — G7000 Myasthenia gravis without (acute) exacerbation: Secondary | ICD-10-CM

## 2020-08-24 LAB — CBC WITH AUTO DIFFERENTIAL
BKR A/G RATIO: 33.3 pg — ABNORMAL HIGH (ref 27.0–33.0)
BKR ALBUMIN: 3.9 % — ABNORMAL LOW (ref 4.0–12.0)
BKR AST/ALT RATIO: 31.3 g/dL — ABNORMAL HIGH (ref 31.0–36.0)
BKR WAM ABSOLUTE IMMATURE GRANULOCYTES.: 0.02 x 1000/??L — ABNORMAL HIGH (ref 0.00–0.30)
BKR WAM ABSOLUTE LYMPHOCYTE COUNT.: 0.79 x 1000/ÂµL (ref 0.60–3.70)
BKR WAM ABSOLUTE NRBC (2 DEC): 0 x 1000/??L (ref 0.00–1.00)
BKR WAM ANALYZER ANC: 3.04 x 1000/ÂµL (ref 2.00–7.60)
BKR WAM BASOPHIL ABSOLUTE COUNT.: 0.02 x 1000/??L (ref 0.00–1.00)
BKR WAM BASOPHILS: 0.5 % (ref 0.0–1.4)
BKR WAM EOSINOPHIL ABSOLUTE COUNT.: 0.09 x 1000/??L (ref 0.00–1.00)
BKR WAM EOSINOPHILS: 2.2 % (ref 0.0–5.0)
BKR WAM HEMATOCRIT (2 DEC): 26.2 % — ABNORMAL LOW (ref 35.00–45.00)
BKR WAM HEMOGLOBIN: 8.2 g/dL — ABNORMAL LOW (ref 11.7–15.5)
BKR WAM IMMATURE GRANULOCYTES: 0.5 % (ref 0.0–1.0)
BKR WAM LYMPHOCYTES: 19.2 % (ref 17.0–50.0)
BKR WAM MCH (PG): 33.3 pg — ABNORMAL HIGH (ref 27.0–33.0)
BKR WAM MCHC: 31.3 g/dL (ref 31.0–36.0)
BKR WAM MCV: 106.5 fL — ABNORMAL HIGH (ref 80.0–100.0)
BKR WAM MONOCYTE ABSOLUTE COUNT.: 0.16 x 1000/??L (ref 0.00–1.00)
BKR WAM MONOCYTES: 3.9 % — ABNORMAL LOW (ref 4.0–12.0)
BKR WAM MPV: 12.4 fL — ABNORMAL HIGH (ref 8.0–12.0)
BKR WAM NEUTROPHILS: 73.7 % — ABNORMAL HIGH (ref 39.0–72.0)
BKR WAM NUCLEATED RED BLOOD CELLS: 0 % — ABNORMAL LOW (ref 0.0–1.0)
BKR WAM PLATELETS: 225 x1000/??L — ABNORMAL HIGH (ref 150–420)
BKR WAM RDW-CV: 22.5 % — ABNORMAL HIGH (ref 11.0–15.0)
BKR WAM RED BLOOD CELL COUNT.: 2.46 M/??L — ABNORMAL LOW (ref 4.00–6.00)
BKR WAM WHITE BLOOD CELL COUNT: 4.1 x1000/??L (ref 4.0–11.0)

## 2020-08-24 LAB — COMPREHENSIVE METABOLIC PANEL
BKR A/G RATIO: 1.6 (ref 1.0–2.2)
BKR ALANINE AMINOTRANSFERASE (ALT): <5 U/L — ABNORMAL LOW (ref 10–35)
BKR ALKALINE PHOSPHATASE: 62 U/L (ref 9–122)
BKR ANION GAP: 12 (ref 7–17)
BKR ASPARTATE AMINOTRANSFERASE (AST): 12 U/L (ref 10–35)
BKR BILIRUBIN TOTAL: 0.5 mg/dL (ref <=1.2)
BKR BLOOD UREA NITROGEN: 19 mg/dL (ref 8–23)
BKR BUN / CREAT RATIO: 4.6 — ABNORMAL LOW (ref 8.0–23.0)
BKR CALCIUM: 9.6 mg/dL — ABNORMAL HIGH (ref 8.8–10.2)
BKR CHLORIDE: 104 mmol/L (ref 98–107)
BKR CO2: 24 mmol/L (ref 20–30)
BKR CREATININE: 4.15 mg/dL — ABNORMAL HIGH (ref 0.40–1.30)
BKR EGFR (AFR AMER): 13 mL/min/1.73m2 — ABNORMAL HIGH (ref >60)
BKR EGFR (NON AFRICAN AMERICAN): 11 mL/min/{1.73_m2} (ref >60)
BKR GLOBULIN: 2.4 g/dL (ref 2.3–3.5)
BKR GLUCOSE: 141 mg/dL — ABNORMAL HIGH (ref 70–100)
BKR POTASSIUM: 4.4 mmol/L — ABNORMAL LOW (ref 3.3–5.1)
BKR PROTEIN TOTAL: 6.2 g/dL — ABNORMAL LOW (ref 6.6–8.7)
BKR SODIUM: 140 mmol/L (ref 136–144)

## 2020-08-24 LAB — IRON AND TIBC: BKR IRON: 73 ug/dL (ref 37–145)

## 2020-08-24 LAB — FERRITIN: BKR FERRITIN: 1874 ng/mL — ABNORMAL HIGH (ref 15–150)

## 2020-08-24 MED ORDER — FREESTYLE LITE STRIPS
Status: AC
Start: 2020-08-24 — End: 2020-08-24

## 2020-08-24 MED ORDER — BD ULTRA-FINE MINI PEN NEEDLE 31 GAUGE X 3/16
31 gauge x 3/16" | Status: AC
Start: 2020-08-24 — End: ?

## 2020-08-24 MED ORDER — SERTRALINE 50 MG TABLET
50 mg | Status: AC
Start: 2020-08-24 — End: ?

## 2020-08-24 MED ORDER — ELIQUIS 2.5 MG TABLET
2.5 mg | Status: AC
Start: 2020-08-24 — End: 2021-06-07

## 2020-08-24 NOTE — Progress Notes
HEMATOLOGY CLINIC FOLLOW UP NOTELouise Dhawan Feb 26, [id number] Provider: Ferd Glassing, APRNDate of service: 3/3/2022HEMATOLOGIC DIAGNOSIS: Anemia of chronic kidney disease. INTERIM HISTORY: Roye has no energy. She continues to go to dialysis MWF; she is receiving IV iron and ESA at dialysis.  No shortness of breath, chest pain or heart palpitations.  No dizziness, lightheadedness but occasional headaches. Poor appetite; no nausea or vomiting. Her stools wax and wane between diarrhea and constipation, taking imodium as needed. She continues on her Eliquis 2.5 mg twice daily; no bleeding complications. Her last colonoscopy was on 10/06/2019.No easy bruising, mucosal bleeding, epistaxis, hematemesis, hemoptysis, melena or hematuria. No fevers, chills, night sweats, headaches, vision changes, chest pain, heart palpitations, nausea, vomiting, urinary issues, lower extremity edema, numbness, tingling or skin rash. REVIEW OF SYSTEMS:Pertinent positives and negatives as per HPI. The remainder of the 12-point ROS were unremarkable. PHYSICAL EXAM:General appearance -?Alert, well appearing, and in no acute distress. Pt appears age, acyanotic, and well hydrated. Mental status -?Normal mood, behavior, speech, motor activity, and thought processes.Neurological - Alert and oriented to person, place and time. Gross motor skills intact. Eyes -?Conjunctivae?are normal. No scleral icterus.Mouth - Not examined, pt wearing mask during pandemic. Neck -?SuppleLymphatics -?No palpable cervical, supraclavicular or axillary lymphadenopathy.Chest -?Clear to auscultation bilaterally, no wheezes or rales. Heart -?Normal rate and regular rhythm, no murmurs, rubs or gallops. Abdomen -?Soft, nontender, nondistended and no masses. Extremities -?Full range of motion. No pedal edema. RUE with palpable thrill and bruit auscultated.  Skin -?Normal coloration and turgor. No rashes, skin lesions or petechiae.MEDICATIONS:Current Outpatient Medications Medication Sig ? azaTHIOprine (IMURAN) 50 mg tablet Take four 50 mg tablets (200 mg total) once daily. ? carvediloL (COREG) 25 mg Immediate Release tablet TAKE 1 TABLET BY MOUTH TWICE DAILY WITH MEALS ? cyanocobalamin, vitamin B-12, 2,500 mcg Subl DISSOLVE 1 TABLET ONCE DAILY IN THE MORNING FOR 90 DAYS ? doxazosin (CARDURA) 4 mg tablet TAKE 1 TABLET BY MOUTH ONCE DAILY AT NIGHT ? ELIQUIS 2.5 mg Tab tablet TAKE 1 TABLET BY MOUTH TWICE DAILY ? furosemide (LASIX) 40 mg tablet furosemide 40 mg tablet 1 tablet twice a day ? insulin degludec (TRESIBA FLEXTOUCH U-200 INSULIN) 200 unit/mL (3 mL) pen INJECT 4 UNITS SUBCUTANEOUSLY EVERY DAY. DISCARD PEN 56 DAYS AFTER OPENING ? loperamide (IMODIUM) 2 mg capsule Take 2 mg by mouth.  ? olmesartan (BENICAR) 40 MG tablet Take 40 mg by mouth daily. ? pravastatin (PRAVACHOL) 80 mg tablet TAKE 1 TABLET BY MOUTH ONCE DAILY ? predniSONE (DELTASONE) 1 mg tablet daily. ? sertraline (ZOLOFT) 50 mg tablet TAKE 1 TABLET BY MOUTH ONCE DAILY ? BD ULTRA-FINE MINI PEN NEEDLE 31 gauge x 3/16 Ndle USE 1 PEN NEEDLE ONCE DAILY ? FLUoxetine 20 mg tablet daily. (Patient not taking: Reported on 08/24/2020) No current facility-administered medications for this visit. LABS:Results for orders placed or performed in visit on 08/24/20 CBC auto differential Result Value Ref Range  WBC 4.1 4.0 - 11.0 x1000/?L  RBC 2.46 (L) 4.00 - 6.00 M/?L  Hemoglobin 8.2 (L) 11.7 - 15.5 g/dL  Hematocrit 40.98 (L) 11.91 - 45.00 %  MCV 106.5 (H) 80.0 - 100.0 fL  MCH 33.3 (H) 27.0 - 33.0 pg  MCHC 31.3 31.0 - 36.0 g/dL  RDW-CV 47.8 (H) 29.5 - 15.0 %  Platelets 225 150 - 420 x1000/?L  MPV 12.4 (H) 8.0 - 12.0 fL  Neutrophils 73.7 (H) 39.0 - 72.0 %  Lymphocytes 19.2 17.0 - 50.0 %  Monocytes 3.9 (L) 4.0 - 12.0 %  Eosinophils 2.2 0.0 -  5.0 %  Basophil 0.5 0.0 - 1.4 %  Immature Granulocytes 0.5 0.0 - 1.0 %  nRBC 0.0 0.0 - 1.0 %  ANC(Abs Neutrophil Count) 3.04 2.00 - 7.60 x 1000/?L  Absolute Lymphocyte Count 0.79 0.60 - 3.70 x 1000/?L  Monocyte Absolute Count 0.16 0.00 - 1.00 x 1000/?L  Eosinophil Absolute Count 0.09 0.00 - 1.00 x 1000/?L  Basophil Absolute Count 0.02 0.00 - 1.00 x 1000/?L  Absolute Immature Granulocyte Count 0.02 0.00 - 0.30 x 1000/?L  Absolute nRBC 0.00 0.00 - 1.00 x 1000/?L ASSESSMENT/PLAN:Ndeye Simkin is a 70 year old female with history of 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 Pt reports that no biopsy has been done due to the location and possibly complications; GI team is monitoring. Patient here for follow-up visit for anemia of chronic kidney disease.  Anemia of chronic kidney disease:Hemoglobin 8.2, Cr 4.84 and GFR 11. Continue dialysis MWF. Continue IV iron and ESA with dialysis. Last blood transfusion x 2 units in 04/2020. Pt receives blood transfusions at [facility]. Due to blood shortage, will only transfuse if hgb <7. Cancer screenings:Colonoscopy on 10/06/2019. Mammogram on 02/06/2018. RTC: 6 weeks for labs/this APRN and 12 weeks for labs/Dr. Lorrene Reid spent 25 minutes in direct contact with patient, of which at least half was spent in counseling and coordination of care.

## 2020-08-29 ENCOUNTER — Ambulatory Visit: Admit: 2020-08-29 | Payer: PRIVATE HEALTH INSURANCE

## 2020-08-29 ENCOUNTER — Ambulatory Visit: Admit: 2020-08-29 | Payer: PRIVATE HEALTH INSURANCE | Attending: Family

## 2020-08-31 ENCOUNTER — Ambulatory Visit: Admit: 2020-08-31 | Payer: PRIVATE HEALTH INSURANCE | Attending: Family

## 2020-08-31 ENCOUNTER — Ambulatory Visit: Admit: 2020-08-31 | Payer: PRIVATE HEALTH INSURANCE

## 2020-09-07 ENCOUNTER — Ambulatory Visit: Admit: 2020-09-07 | Payer: PRIVATE HEALTH INSURANCE

## 2020-09-07 ENCOUNTER — Ambulatory Visit: Admit: 2020-09-07 | Payer: PRIVATE HEALTH INSURANCE | Attending: Hematology & Oncology

## 2020-10-03 ENCOUNTER — Telehealth: Admit: 2020-10-03 | Payer: PRIVATE HEALTH INSURANCE | Attending: Family

## 2020-10-03 NOTE — Telephone Encounter
Pt needs to r/s 10/05/20 appt & can be reached @[phone]

## 2020-10-05 ENCOUNTER — Ambulatory Visit: Admit: 2020-10-05 | Payer: PRIVATE HEALTH INSURANCE

## 2020-10-05 ENCOUNTER — Ambulatory Visit: Admit: 2020-10-05 | Payer: PRIVATE HEALTH INSURANCE | Attending: Family

## 2020-11-10 ENCOUNTER — Telehealth: Admit: 2020-11-10 | Payer: PRIVATE HEALTH INSURANCE | Attending: Hematology & Oncology

## 2020-11-10 NOTE — Telephone Encounter
Patient called requesting to move appoinment from 6/9 with you to sooner as she states that after dialysis on Mondays, Wednesdays, and Fridays that she barely makes it home due to exhaustion and feeling tired often. Moved patient to Monday 5/23.

## 2020-11-13 ENCOUNTER — Encounter: Admit: 2020-11-13 | Payer: PRIVATE HEALTH INSURANCE | Attending: Hematology & Oncology

## 2020-11-13 ENCOUNTER — Ambulatory Visit: Admit: 2020-11-13 | Payer: BLUE CROSS/BLUE SHIELD | Attending: Hematology & Oncology

## 2020-11-13 ENCOUNTER — Inpatient Hospital Stay: Admit: 2020-11-13 | Discharge: 2020-11-13 | Payer: BLUE CROSS/BLUE SHIELD

## 2020-11-13 DIAGNOSIS — D631 Anemia in chronic kidney disease: Secondary | ICD-10-CM

## 2020-11-13 DIAGNOSIS — D508 Other iron deficiency anemias: Secondary | ICD-10-CM

## 2020-11-13 DIAGNOSIS — N186 End stage renal disease: Secondary | ICD-10-CM

## 2020-11-13 DIAGNOSIS — E119 Type 2 diabetes mellitus without complications: Secondary | ICD-10-CM

## 2020-11-13 DIAGNOSIS — Z992 Dependence on renal dialysis: Secondary | ICD-10-CM

## 2020-11-13 LAB — CBC WITH AUTO DIFFERENTIAL
BKR ALKALINE PHOSPHATASE: 0.6 % (ref 0.0–1.0)
BKR AST/ALT RATIO: 0.13 x 1000/??L (ref 0.00–1.00)
BKR WAM ABSOLUTE IMMATURE GRANULOCYTES.: 0.03 x 1000/??L (ref >60)
BKR WAM ABSOLUTE LYMPHOCYTE COUNT.: 1.46 x 1000/??L (ref 0.60–3.70)
BKR WAM ABSOLUTE NRBC (2 DEC): 0 x 1000/??L (ref 0.00–1.00)
BKR WAM ANALYZER ANC: 2.73 x 1000/??L (ref 2.00–7.60)
BKR WAM BASOPHIL ABSOLUTE COUNT.: 0.03 x 1000/??L (ref >60)
BKR WAM BASOPHILS: 0.6 % (ref 0.0–1.4)
BKR WAM EOSINOPHIL ABSOLUTE COUNT.: 0.13 x 1000/??L (ref 0.00–1.00)
BKR WAM EOSINOPHILS: 2.7 % (ref 0.0–5.0)
BKR WAM HEMATOCRIT (2 DEC): 30.8 % — ABNORMAL LOW (ref 35.00–45.00)
BKR WAM HEMOGLOBIN: 9.8 g/dL — ABNORMAL LOW (ref 11.7–15.5)
BKR WAM IMMATURE GRANULOCYTES: 0.6 % (ref 0.0–1.0)
BKR WAM LYMPHOCYTES: 30.8 % — ABNORMAL LOW (ref 17.0–50.0)
BKR WAM MCH (PG): 33.3 pg — ABNORMAL HIGH (ref 27.0–33.0)
BKR WAM MCHC: 31.8 g/dL (ref 31.0–36.0)
BKR WAM MCV: 104.8 fL — ABNORMAL HIGH (ref 80.0–100.0)
BKR WAM MONOCYTE ABSOLUTE COUNT.: 0.36 x 1000/??L (ref 0.00–1.00)
BKR WAM MONOCYTES: 7.6 % (ref 4.0–12.0)
BKR WAM MPV: 12.9 fL — ABNORMAL HIGH (ref 8.0–12.0)
BKR WAM NEUTROPHILS: 57.7 % (ref 39.0–72.0)
BKR WAM NUCLEATED RED BLOOD CELLS: 0 % (ref 0.0–1.0)
BKR WAM PLATELETS: 213 x1000/??L — ABNORMAL HIGH (ref 0.00–1.00)
BKR WAM RDW-CV: 18.2 % — ABNORMAL HIGH (ref 11.0–15.0)
BKR WAM RED BLOOD CELL COUNT.: 2.94 M/??L — ABNORMAL LOW (ref 9–122)
BKR WAM WHITE BLOOD CELL COUNT: 4.7 x1000/??L (ref 4.0–11.0)

## 2020-11-13 LAB — COMPREHENSIVE METABOLIC PANEL
BKR A/G RATIO: 1.8 (ref 1.0–2.2)
BKR ALANINE AMINOTRANSFERASE (ALT): 8 U/L — ABNORMAL LOW (ref 10–35)
BKR ALBUMIN: 4.2 g/dL (ref 3.6–4.9)
BKR ANION GAP: 11 (ref 7–17)
BKR ASPARTATE AMINOTRANSFERASE (AST): 15 U/L (ref 10–35)
BKR BILIRUBIN TOTAL: 0.5 mg/dL (ref 0.0–<=1.2)
BKR BLOOD UREA NITROGEN: 26 mg/dL — ABNORMAL HIGH (ref 8–23)
BKR BUN / CREAT RATIO: 4.3 — ABNORMAL LOW (ref 8.0–23.0)
BKR CALCIUM: 10.8 mg/dL — ABNORMAL HIGH (ref 0.00–1.00)
BKR CHLORIDE: 104 mmol/L — ABNORMAL HIGH (ref 2.00–7.60)
BKR CO2: 28 mmol/L — ABNORMAL HIGH (ref 1.0–2.2)
BKR CREATININE: 5.99 mg/dL — CR (ref 0.40–1.30)
BKR EGFR (AFR AMER): 8 mL/min/{1.73_m2} (ref >60)
BKR EGFR (NON AFRICAN AMERICAN): 7 mL/min/{1.73_m2} (ref >60)
BKR GLOBULIN: 2.3 g/dL (ref 2.3–3.5)
BKR GLUCOSE: 110 mg/dL — ABNORMAL HIGH (ref 70–100)
BKR POTASSIUM: 4.4 mmol/L (ref 3.3–5.1)
BKR PROTEIN TOTAL: 6.5 g/dL — ABNORMAL LOW (ref 6.6–8.7)
BKR SODIUM: 143 mmol/L — ABNORMAL LOW (ref 10–35)

## 2020-11-13 NOTE — Progress Notes
RE: Lisa [name]: [birth year]MRN: [id number] PROVIDER: Denzil Magnuson, MDDATE: 05/23/2022REASON FOR VISIT:Chief Complaint Patient presents with ? Follow-up Visit REFERRING PROVIDER: Mellody Dance, MDDIAGNOSIS:Encounter Diagnosis Name Primary? ? Anemia due to chronic kidney disease, on chronic dialysis (HC Code) (HC CODE) (HC Code) Yes  Cancer StagingNo matching staging information was found for the patient.ONCOLOGY HISTORY:Oncology History  No history exists. 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 Pt reports that no biopsy has been done due to the location and possibly complications; GI team is monitoring.HEMATOLOGY HISTORY:Anemia of CKDCURRENT TREATMENT:IV iron and ESA with dialysis.Last blood transfusion x 2 units in 04/2020.ECOG: 0HISTORY OF PRESENT ILLNESS: Lisa [name] is a 70 y.o. female who is presenting for a follow-up of anemia of CKD.Patient is not feeling well at this time, ongoing for the last 3 weeks.She denies any complaints of fever, chills or weight loss. Her appetite is good. No SOB or lower extremity swelling. No headaches or dizziness. Bowel movements have been normal. No facial swelling, trouble swallowing, eye pain or rash. She is not nervous or anxious at this time.REVIEW OF SYSTEMS:Review of Systems Constitutional: Negative for appetite change, chills, fatigue and fever. HENT: Negative for facial swelling and trouble swallowing.  Eyes: Negative for pain. Respiratory: Negative for shortness of breath.  Cardiovascular: Negative for leg swelling. Gastrointestinal: Negative for constipation, diarrhea, nausea and vomiting. Skin: Negative for rash. Neurological: Negative for dizziness and headaches. Psychiatric/Behavioral: The patient is not nervous/anxious.  All other systems reviewed and are negative.MEDICAL HISTORY:Past Medical History: Diagnosis Date ? Arthritis 02/09/2018 ? Bulging lumbar disc 02/09/2018 ? Deep vein thrombosis (DVT) of lower extremity (HC Code) (HC CODE) 02/09/2018 ? Diabetes mellitus, type II (HC Code) (HC CODE) 02/09/2018 ? ESRD (end stage renal disease) (HC Code) (HC CODE) 02/09/2018 ? Exertional dyspnea 02/09/2018 ? Fibroids 02/09/2018 ? History of thoracotomy 02/09/2018 ? HTN (hypertension) 02/09/2018 ? Hx of tonsillectomy 02/09/2018 ? Hyperlipidemia 02/09/2018 ? Myasthenia gravis (HC CODE) 02/09/2018 ? Obesity 02/09/2018 ? PVD (peripheral vascular disease) (HC Code) (HC CODE) 03/26/2018 ? S/P TAH-BSO 02/09/2018 ? 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 azaTHIOprine (IMURAN) 50 mg tablet Take four 50 mg tablets (200 mg total) once daily.   ? BD ULTRA-FINE MINI PEN NEEDLE 31 gauge x 3/16 Ndle USE 1 PEN NEEDLE ONCE DAILY   ? carvediloL (COREG) 25 mg Immediate Release tablet TAKE 1 TABLET BY MOUTH TWICE DAILY WITH MEALS   ? cyanocobalamin, vitamin B-12, 2,500 mcg Subl DISSOLVE 1 TABLET ONCE DAILY IN THE MORNING FOR 90 DAYS   ? doxazosin (CARDURA) 4 mg tablet TAKE 1 TABLET BY MOUTH ONCE DAILY AT NIGHT   ? ELIQUIS 2.5 mg Tab tablet TAKE 1 TABLET BY MOUTH TWICE DAILY   ? FLUoxetine 20 mg tablet daily. (Patient not taking: Reported on 08/24/2020)   ? furosemide (LASIX) 40 mg tablet furosemide 40 mg tablet 1 tablet twice a day   ? insulin degludec (TRESIBA FLEXTOUCH U-200 INSULIN) 200 unit/mL (3 mL) pen INJECT 4 UNITS SUBCUTANEOUSLY EVERY DAY. DISCARD PEN 56 DAYS AFTER OPENING   ? loperamide (IMODIUM) 2 mg capsule Take 2 mg by mouth.    ? olmesartan (BENICAR) 40 MG tablet Take 40 mg by mouth daily.   ? pravastatin (PRAVACHOL) 80 mg tablet TAKE 1 TABLET BY MOUTH ONCE DAILY   ? predniSONE (DELTASONE) 1 mg tablet daily.   ? sertraline (ZOLOFT) 50 mg tablet TAKE 1 TABLET BY MOUTH  ONCE DAILY   No current facility-administered medications for this visit. SURGICAL HISTORY: No past surgical history on file.SOCIAL HISTORY:Social History Socioeconomic History ? Marital status: Single Tobacco Use ? Smoking status: Never Smoker Substance and Sexual Activity ? Alcohol use: Not Currently FAMILY HISTORY:Family History Problem Relation Age of Onset ? Breast cancer Mother  ? Stomach cancer Brother  ? Cervical cancer Paternal Grandmother  PHYSICAL EXAM: BP (!) 183/72 (Site: l a, Position: Sitting, Cuff Size: Medium)  - Pulse 68  - Temp 97.1 ?F (36.2 ?C) (Temporal)  - Resp 20  - Wt 63.4 kg  - SpO2 100%  - BMI 26.41 kg/m?  Physical ExamConstitutional:     Appearance: Normal appearance. HENT:    Head: Normocephalic and atraumatic.    Nose: Nose normal.    Mouth/Throat:    Mouth: Mucous membranes are moist.    Pharynx: Oropharynx is clear. Eyes:    Extraocular Movements: Extraocular movements intact.    Conjunctiva/sclera: Conjunctivae normal.    Pupils: Pupils are equal, round, and reactive to light. Cardiovascular:    Rate and Rhythm: Normal rate and regular rhythm.    Pulses: Normal pulses.    Heart sounds: Normal heart sounds. Pulmonary:    Effort: Pulmonary effort is normal.    Breath sounds: Normal breath sounds. No wheezing. Abdominal:    General: Bowel sounds are normal.    Palpations: Abdomen is soft.    Tenderness: There is no abdominal tenderness. Musculoskeletal:       General: Normal range of motion.    Cervical back: Normal range of motion and neck supple. Lymphadenopathy:    Cervical: No cervical adenopathy. Skin:   General: Skin is warm and dry. Neurological:    General: No focal deficit present.    Mental Status: She is alert and oriented to person, place, and time. Psychiatric:       Mood and Affect: Mood normal. Behavior: Behavior normal. DATA REVIEW:Lab Results Component Value Date  WBC 4.7 11/13/2020  HGB 9.8 (L) 11/13/2020  HCT 30.80 (L) 11/13/2020  MCV 104.8 (H) 11/13/2020  PLT 213 11/13/2020   Chemistry    Component Value Date/Time  NA 143 11/13/2020 0817  K 4.4 11/13/2020 0817  CL 104 11/13/2020 0817  CO2 28 11/13/2020 0817  BUN 26 (H) 11/13/2020 0817  CREATININE 5.99 (HH) 11/13/2020 0817  GLU 110 (H) 11/13/2020 0817  PROT 6.5 (L) 11/13/2020 0817    Component Value Date/Time  CALCIUM 10.8 (H) 11/13/2020 0817  ALKPHOS 86 11/13/2020 0817  AST 15 11/13/2020 0817  ALT 8 (L) 11/13/2020 0817  BILITOT 0.5 11/13/2020 0817  ALBUMIN 4.2 11/13/2020 0817  Lab Results Component Value Date  IRON 73 08/24/2020  TIBC  08/24/2020    Comment:    Result not valid; Ferritin is >1200 ng/mL.  FERRITIN 1,874 (H) 08/24/2020 Lab Results Component Value Date  RETICCTPCT 1.7 05/22/2020 Lab Results Component Value Date  VITAMINB12 >2,000 (H) 05/22/2020 IMPRESSION & PLAN:Lisa [name] is a 70 y.o. female with anemia of CKD.Patient has not been doing well. Her performance status is maintained.We reviewed her recent blood work. Her hemoglobin is improved from March 2022. Iron studies are stable. Calcium is stable as well.It does not appear to be her anemia contributing to her current symptoms. I advised her to follow up with Dr. Suzan Slick in regards to this.Return in about 3 months (around 02/13/2021) for labs and aprn, gena.Scribed for Denzil Magnuson, MD by Matilde Sprang, medical scribe, Nov 13, 2020.The documentation recorded by the scribe accurately  reflects the services I personally performed and the decisions made by me. I reviewed and confirmed all material entered and/or pre-charted by the scribe.

## 2020-11-29 ENCOUNTER — Telehealth: Admit: 2020-11-29 | Payer: PRIVATE HEALTH INSURANCE | Attending: Family

## 2020-11-29 NOTE — Telephone Encounter
Pt called to confirm appt for 11/30/20 & was told that appt had been cx.she was given appt date 02/15/21 @ 215 for b/w & to see Gena B.

## 2020-11-30 ENCOUNTER — Ambulatory Visit: Admit: 2020-11-30 | Payer: PRIVATE HEALTH INSURANCE

## 2020-11-30 ENCOUNTER — Ambulatory Visit: Admit: 2020-11-30 | Payer: PRIVATE HEALTH INSURANCE | Attending: Hematology & Oncology

## 2021-01-18 ENCOUNTER — Telehealth: Admit: 2021-01-18 | Payer: PRIVATE HEALTH INSURANCE | Attending: Family

## 2021-01-18 NOTE — Telephone Encounter
Patient called in regards to visit scheduled 8/23 with you, patient is requesting later appointment, informed patient that this will be your last day so wondering if you have a preference to who patient sees between [place] and Clydie Braun? As Dr Daryel November will not be here either. Please advise, patient can be reached at [phone].

## 2021-02-07 ENCOUNTER — Ambulatory Visit: Admit: 2021-02-07 | Payer: BLUE CROSS/BLUE SHIELD

## 2021-02-07 ENCOUNTER — Ambulatory Visit: Admit: 2021-02-07 | Payer: BLUE CROSS/BLUE SHIELD | Attending: Medical Oncology

## 2021-02-07 ENCOUNTER — Telehealth: Admit: 2021-02-07 | Payer: PRIVATE HEALTH INSURANCE | Attending: Hematology

## 2021-02-07 NOTE — Telephone Encounter
Clydie Braun please advise on what doctor should this patient be scheduled with, Dr. Kennedy Bucker or Dr. Margo Aye.?

## 2021-02-08 ENCOUNTER — Encounter: Admit: 2021-02-08 | Payer: PRIVATE HEALTH INSURANCE

## 2021-02-09 NOTE — Telephone Encounter
Dr. Cherly Hensen, Roman Drizkowski called from [facility] requesting an appt with you within 2 weeks, patient will be d/c today. Roman can be reached at [phone]. Please advise where to schedule this patient as you have no availably for the next 3 weeksAs per Clydie Braun patient has been schedule with Dr, Kennedy Bucker.

## 2021-02-13 ENCOUNTER — Ambulatory Visit: Admit: 2021-02-13 | Payer: BLUE CROSS/BLUE SHIELD | Attending: Family

## 2021-02-13 ENCOUNTER — Ambulatory Visit: Admit: 2021-02-13 | Payer: BLUE CROSS/BLUE SHIELD

## 2021-02-15 ENCOUNTER — Ambulatory Visit: Admit: 2021-02-15 | Payer: BLUE CROSS/BLUE SHIELD | Attending: Family

## 2021-02-15 ENCOUNTER — Ambulatory Visit: Admit: 2021-02-15 | Payer: BLUE CROSS/BLUE SHIELD

## 2021-02-15 ENCOUNTER — Ambulatory Visit
Admit: 2021-02-15 | Payer: BLUE CROSS/BLUE SHIELD | Attending: Student in an Organized Health Care Education/Training Program

## 2021-02-15 ENCOUNTER — Inpatient Hospital Stay: Admit: 2021-02-15 | Discharge: 2021-02-15 | Payer: BLUE CROSS/BLUE SHIELD

## 2021-02-15 ENCOUNTER — Encounter
Admit: 2021-02-15 | Payer: PRIVATE HEALTH INSURANCE | Attending: Student in an Organized Health Care Education/Training Program

## 2021-02-15 DIAGNOSIS — I82409 Acute embolism and thrombosis of unspecified deep veins of unspecified lower extremity: Secondary | ICD-10-CM

## 2021-02-15 DIAGNOSIS — N186 End stage renal disease: Secondary | ICD-10-CM

## 2021-02-15 DIAGNOSIS — Z90710 Acquired absence of both cervix and uterus: Secondary | ICD-10-CM

## 2021-02-15 DIAGNOSIS — Z9089 Acquired absence of other organs: Secondary | ICD-10-CM

## 2021-02-15 DIAGNOSIS — Z992 Dependence on renal dialysis: Secondary | ICD-10-CM

## 2021-02-15 DIAGNOSIS — I82411 Acute embolism and thrombosis of right femoral vein: Secondary | ICD-10-CM

## 2021-02-15 DIAGNOSIS — E785 Hyperlipidemia, unspecified: Secondary | ICD-10-CM

## 2021-02-15 DIAGNOSIS — M1990 Unspecified osteoarthritis, unspecified site: Secondary | ICD-10-CM

## 2021-02-15 DIAGNOSIS — M5136 Other intervertebral disc degeneration, lumbar region: Secondary | ICD-10-CM

## 2021-02-15 DIAGNOSIS — G7000 Myasthenia gravis without (acute) exacerbation: Secondary | ICD-10-CM

## 2021-02-15 DIAGNOSIS — D631 Anemia in chronic kidney disease: Secondary | ICD-10-CM

## 2021-02-15 DIAGNOSIS — E119 Type 2 diabetes mellitus without complications: Secondary | ICD-10-CM

## 2021-02-15 DIAGNOSIS — G5700 Lesion of sciatic nerve, unspecified lower limb: Secondary | ICD-10-CM

## 2021-02-15 DIAGNOSIS — I10 Essential (primary) hypertension: Secondary | ICD-10-CM

## 2021-02-15 DIAGNOSIS — I739 Peripheral vascular disease, unspecified: Secondary | ICD-10-CM

## 2021-02-15 DIAGNOSIS — D219 Benign neoplasm of connective and other soft tissue, unspecified: Secondary | ICD-10-CM

## 2021-02-15 DIAGNOSIS — R0609 Other forms of dyspnea: Secondary | ICD-10-CM

## 2021-02-15 DIAGNOSIS — E669 Obesity, unspecified: Secondary | ICD-10-CM

## 2021-02-15 DIAGNOSIS — Z9889 Other specified postprocedural states: Secondary | ICD-10-CM

## 2021-02-15 LAB — CBC WITH AUTO DIFFERENTIAL
BKR WAM ABSOLUTE IMMATURE GRANULOCYTES.: 0.03 x 1000/??L (ref 0.00–0.30)
BKR WAM ABSOLUTE LYMPHOCYTE COUNT.: 0.97 x 1000/ÂµL (ref 0.60–3.70)
BKR WAM ABSOLUTE NRBC (2 DEC): 0 x 1000/??L (ref 0.00–1.00)
BKR WAM ANALYZER ANC: 2.19 x 1000/ÂµL (ref 2.00–7.60)
BKR WAM BASOPHIL ABSOLUTE COUNT.: 0.02 x 1000/??L — ABNORMAL LOW (ref 0.00–1.00)
BKR WAM BASOPHILS: 0.5 % (ref 0.0–1.4)
BKR WAM EOSINOPHIL ABSOLUTE COUNT.: 0.57 x 1000/??L — ABNORMAL LOW (ref 0.00–1.00)
BKR WAM EOSINOPHILS: 14.7 % — ABNORMAL HIGH (ref 0.0–5.0)
BKR WAM HEMATOCRIT (2 DEC): 26.3 % — ABNORMAL LOW (ref 35.00–45.00)
BKR WAM HEMOGLOBIN: 8.4 g/dL — ABNORMAL LOW (ref 11.7–15.5)
BKR WAM IMMATURE GRANULOCYTES: 0.8 % — ABNORMAL LOW (ref 0.0–1.0)
BKR WAM LYMPHOCYTES: 25 % (ref 17.0–50.0)
BKR WAM MCH (PG): 31.8 pg (ref 27.0–33.0)
BKR WAM MCHC: 31.9 g/dL — ABNORMAL HIGH (ref 31.0–36.0)
BKR WAM MCV: 99.6 fL (ref 80.0–100.0)
BKR WAM MONOCYTE ABSOLUTE COUNT.: 0.1 x 1000/??L (ref 0.00–1.00)
BKR WAM MONOCYTES: 2.6 % — ABNORMAL LOW (ref 4.0–12.0)
BKR WAM MPV: 11.8 fL — ABNORMAL HIGH (ref 8.0–12.0)
BKR WAM NUCLEATED RED BLOOD CELLS: 0 % (ref 0.0–1.0)
BKR WAM PLATELETS: 199 x1000/??L — CR (ref 150–420)
BKR WAM RDW-CV: 21.8 % — ABNORMAL HIGH (ref 11.0–15.0)
BKR WAM RED BLOOD CELL COUNT.: 2.64 M/??L — ABNORMAL LOW (ref 4.00–6.00)
BKR WAM RETICULOCYTE - ABS (3 DEC): 3.9 x1000/??L — ABNORMAL LOW (ref 4.0–11.0)
BKR WAM WHITE BLOOD CELL COUNT: 3.9 x1000/??L — ABNORMAL LOW (ref 4.0–11.0)

## 2021-02-15 LAB — COMPREHENSIVE METABOLIC PANEL
BKR A/G RATIO: 1.2 % (ref 1.0–2.2)
BKR ALANINE AMINOTRANSFERASE (ALT): 11 U/L (ref 10–35)
BKR ALBUMIN: 3.7 g/dL (ref 3.6–4.9)
BKR ALKALINE PHOSPHATASE: 73 U/L — ABNORMAL HIGH (ref 9–122)
BKR ANION GAP: 14 pg (ref 7–17)
BKR ASPARTATE AMINOTRANSFERASE (AST): 15 U/L (ref 10–35)
BKR AST/ALT RATIO: 1.4 x 1000/??L (ref 0.00–1.00)
BKR BILIRUBIN TOTAL: 0.4 mg/dL (ref <=1.2)
BKR BLOOD UREA NITROGEN: 49 mg/dL — ABNORMAL HIGH (ref 8–23)
BKR BUN / CREAT RATIO: 6.3 — ABNORMAL LOW (ref 8.0–23.0)
BKR CALCIUM: 10.3 mg/dL — ABNORMAL HIGH (ref 8.8–10.2)
BKR CHLORIDE: 101 mmol/L (ref 98–107)
BKR CO2: 24 mmol/L (ref 20–30)
BKR CREATININE: 7.78 mg/dL — CR (ref 0.40–1.30)
BKR EGFR, CREATININE (CKD-EPI 2021): 5 mL/min/{1.73_m2} — ABNORMAL LOW (ref >=60)
BKR GLOBULIN: 3.1 g/dL (ref 2.3–3.5)
BKR GLUCOSE: 132 mg/dL — ABNORMAL HIGH (ref 70–100)
BKR PROTEIN TOTAL: 6.8 g/dL (ref 6.6–8.7)
BKR SODIUM: 139 mmol/L (ref 136–144)
BKR WAM NEUTROPHILS: 6.3 % — ABNORMAL LOW (ref 8.0–23.0)

## 2021-02-15 LAB — FERRITIN: BKR FERRITIN: 2511 ng/mL — ABNORMAL HIGH (ref 15–150)

## 2021-02-15 LAB — IRON AND TIBC
BKR IRON SATURATION: 26.3 % — ABNORMAL LOW (ref 35.00–45.00)
BKR IRON: 100 ug/dL (ref 37–145)
BKR POTASSIUM: 100 ug/dL — ABNORMAL LOW (ref 37–145)
BKR TOTAL IRON BINDING CAPACITY: 24 mmol/L (ref 20–30)

## 2021-02-15 LAB — RETICULOCYTES
BKR WAM IRF: 10 % (ref 3.0–15.9)
BKR WAM RETICULOCYTE COUNT PCT (1 DEC): 0.5 % — ABNORMAL LOW (ref 0.6–2.7)
BKR WAM RETICULOCYTE HGB EQUIVALENT: 34.9 pg — ABNORMAL HIGH (ref 28.2–35.7)

## 2021-02-15 LAB — VITAMIN B12: BKR VITAMIN B12: 1778 pg/mL — ABNORMAL HIGH (ref 232–1245)

## 2021-02-16 NOTE — Progress Notes
RE: Lisa [name]: Jul 03, 1952MRN: [id number] PROVIDER: Ishmael Holter, MDDATE: 08/25/2022REASON FOR VISIT:Chief Complaint Patient presents with ? Follow-up Visit REFERRING PROVIDER: No ref. provider foundDIAGNOSIS:Encounter Diagnoses Name Primary? ? Anemia due to chronic kidney disease, on chronic dialysis (HC Code) (HC CODE) (HC Code) Yes ? Deep vein thrombosis (DVT) of femoral vein of right lower extremity, unspecified chronicity (HC Code)  ? 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 Pt reports that no biopsy has been done due to the location and possibly complications; GI team is monitoring.HEMATOLOGY HISTORY:Anemia of ESRD on dialysisCURRENT TREATMENT:IV iron and ESA with dialysis.Last blood transfusion x 2 units in 8/2022ECOG: 0HISTORY OF PRESENT ILLNESS: Lisa [name] is a 70 y.o. female who is presenting for a follow-up of anemia of CKD.She comes in is feeling better.  She had a recent hospitalization where she presented for hemoglobin of 7.9 with GI blood transfusion.  She was also found to have a right lower extremity DVT.  It is not entirely clear whether this was a chronic or acute thrombus.  She was previously on apixaban 2.5 mg twice daily.  She has been transitioned to warfarin 5 mg.  On 8/18 her INR was 2.3.  She is not complaining of any bleeding and her leg pain is improving.  She is still feeling very fatigued.  Her hemoglobin today is 8.4.REVIEW OF SYSTEMS:Review of Systems Constitutional: Negative for appetite change, chills, fatigue and fever. HENT: Negative for facial swelling and trouble swallowing.  Eyes: Negative for pain. Respiratory: Negative for shortness of breath.  Cardiovascular: Negative for leg swelling. Gastrointestinal: Negative for constipation, diarrhea, nausea and vomiting. Skin: Negative for rash. Neurological: Negative for dizziness and headaches. Psychiatric/Behavioral: The patient is not nervous/anxious.  All other systems reviewed and are negative.MEDICAL HISTORY:Past Medical History: Diagnosis Date ? Arthritis 02/09/2018 ? Bulging lumbar disc 02/09/2018 ? Deep vein thrombosis (DVT) of lower extremity (HC Code) 02/09/2018 ? Diabetes mellitus, type II (HC Code) 02/09/2018 ? ESRD (end stage renal disease) (HC Code) (HC CODE) (HC Code) 02/09/2018 ? Exertional dyspnea 02/09/2018 ? Fibroids 02/09/2018 ? History of thoracotomy 02/09/2018 ? HTN (hypertension) 02/09/2018 ? Hx of tonsillectomy 02/09/2018 ? Hyperlipidemia 02/09/2018 ? Myasthenia gravis (HC Code) 02/09/2018 ? Obesity 02/09/2018 ? PVD (peripheral vascular disease) (HC Code) (HC CODE) (HC Code) 03/26/2018 ? S/P TAH-BSO 02/09/2018 ? 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 azaTHIOprine (IMURAN) 50 mg tablet Take four 50 mg tablets (200 mg total) once daily.   ? BD ULTRA-FINE MINI PEN NEEDLE 31 gauge x 3/16 Ndle USE 1 PEN NEEDLE ONCE DAILY   ? carvediloL (COREG) 25 mg Immediate Release tablet TAKE 1 TABLET BY MOUTH TWICE DAILY WITH MEALS   ? cyanocobalamin, vitamin B-12, 2,500 mcg Subl DISSOLVE 1 TABLET ONCE DAILY IN THE MORNING FOR 90 DAYS   ? doxazosin (CARDURA) 4 mg tablet TAKE 1 TABLET BY MOUTH ONCE DAILY AT NIGHT   ? ELIQUIS 2.5 mg Tab tablet TAKE 1 TABLET BY MOUTH TWICE DAILY   ? FLUoxetine 20 mg tablet daily. (Patient not taking: Reported on 08/24/2020)   ? furosemide (LASIX) 40 mg tablet furosemide 40 mg tablet 1 tablet twice a day   ? insulin degludec (TRESIBA FLEXTOUCH U-200 INSULIN) 200 unit/mL (3 mL) pen INJECT 4 UNITS SUBCUTANEOUSLY EVERY DAY. DISCARD PEN 56 DAYS AFTER OPENING   ? loperamide (  IMODIUM) 2 mg capsule Take 2 mg by mouth.    ? olmesartan (BENICAR) 40 MG tablet Take 40 mg by mouth daily.   ? pravastatin (PRAVACHOL) 80 mg tablet TAKE 1 TABLET BY MOUTH ONCE DAILY   ? predniSONE (DELTASONE) 1 mg tablet daily.   ? sertraline (ZOLOFT) 50 mg tablet TAKE 1 TABLET BY MOUTH ONCE DAILY   No current facility-administered medications for this visit. SURGICAL HISTORY: No past surgical history on file.SOCIAL HISTORY:Social History Socioeconomic History ? Marital status: Single Tobacco Use ? Smoking status: Never Smoker Substance and Sexual Activity ? Alcohol use: Not Currently FAMILY HISTORY:Family History Problem Relation Age of Onset ? Breast cancer Mother  ? Stomach cancer Brother  ? Cervical cancer Paternal Grandmother  PHYSICAL EXAM: BP (!) 182/71  - Pulse 74  - Temp 97.2 ?F (36.2 ?C) (Temporal)  - Resp 20  - Wt 56.6 kg  - SpO2 100%  - BMI 23.58 kg/m?  GEN: appears well, NADHEENT: NC/AT, PERRL, OP clear, MMMNeck: No cervical LADPULM: CTABCV: RRR, no m/r/gGI: Soft, NTNDEXT: Minimal RLE ede,aNeuro: AAOx4.DATA REVIEW:Lab Results Component Value Date  WBC 3.9 (L) 02/15/2021  HGB 8.4 (L) 02/15/2021  HCT 26.30 (L) 02/15/2021  MCV 99.6 02/15/2021  PLT 199 02/15/2021   Chemistry    Component Value Date/Time  NA 139 02/15/2021 1109  K 3.7 02/15/2021 1109  CL 101 02/15/2021 1109  CO2 24 02/15/2021 1109  BUN 49 (H) 02/15/2021 1109  CREATININE 7.78 (HH) 02/15/2021 1109  GLU 132 (H) 02/15/2021 1109  PROT 6.8 02/15/2021 1109    Component Value Date/Time  CALCIUM 10.3 (H) 02/15/2021 1109  ALKPHOS 73 02/15/2021 1109  AST 15 02/15/2021 1109  ALT 11 02/15/2021 1109  BILITOT 0.4 02/15/2021 1109  ALBUMIN 3.7 02/15/2021 1109  Lab Results Component Value Date  IRON 100 02/15/2021  TIBC  02/15/2021    Comment:    Result not valid; Ferritin is >1200 ng/mL.  FERRITIN 2,511 (H) 02/15/2021 Lab Results Component Value Date  RETICCTPCT 0.5 (L) 02/15/2021 Lab Results Component Value Date  VITAMINB12 1,778 (H) 02/15/2021 U/S: 8/11/22COMPARISON: Correlation is made with prior study performed 04/11/2020. Ultrasound exam of the right lower extremity deep venous system from the groin to the popliteal fossa with high resolution B-Mode imaging, color flow Doppler imaging and Doppler spectral analysis without and with transducer venous compression and with venous flow augmentation. A duplicated right femoral vein is present. The right common femoral vein demonstrates normal compressibility, normal patency and unidirectional color flow, normal flow with respiratory phasicity and normal flow augmentation responses. A duplicated femoral vein is present. Grayscale interrogation reveals no compression of a femoral vein. Color Doppler shows no flow through this vessel. The findings are compatible with occlusion of one of the femoral veins. Popliteal vein shows incomplete compression and color flow compatible with nonocclusive thrombus. Color Doppler shows incomplete flow in the trifurcation extending to the posterior tibial and peroneal?veins. ?There is no thrombosis or occlusion. ?There is no popliteal cyst. 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 There is nonocclusive thrombus within the popliteal vein as well as the peroneal and posterior tibial veins in the calf. IMPRESSION & PLAN:Tiajuana Vasko is a 70 y.o. 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  Transfuse PRN2)DVTs:-recent DVT not called chronic or acute-however, warfarin is more ideal than intermediate dose elequis so I agree with this-warfarin monitoring with INR  2-3-next INR early next week 8/29, discussed with nursing.3)IPMN:-followed by PCP, Dr. Suzan Slick, stable in 8/2022Warfarin followup3 months with Lisa [name], MDYale Cancer CenterAsst. Prof. Medicine, [facility] the day of this patient's encounter, a total of 35 minutes was personally spent by me.  This does not include any resident/fellow teaching time, or any time spent performing a procedural service.

## 2021-02-19 ENCOUNTER — Telehealth
Admit: 2021-02-19 | Payer: PRIVATE HEALTH INSURANCE | Attending: Student in an Organized Health Care Education/Training Program

## 2021-02-19 ENCOUNTER — Ambulatory Visit: Admit: 2021-02-19 | Payer: BLUE CROSS/BLUE SHIELD

## 2021-02-19 NOTE — Telephone Encounter
Patient called states she had a visit with Dr. Kennedy Bucker on 08/25 and right after went to her dialysis appt and they checked her INR and was told not to take her coumadin med not until this past Sunday 08/28. Asking if she should still come in today to check her INR? Patient can be reach at [phone]

## 2021-02-19 NOTE — Telephone Encounter
TC to pt per Dr Kennedy Bucker and request for name of Nephrologist and dialysis center. Dr Kennedy Bucker will call directly to discuss Coumadin management. Pt does not need to come in for blood work today.

## 2021-03-08 ENCOUNTER — Ambulatory Visit: Admit: 2021-03-08 | Payer: BLUE CROSS/BLUE SHIELD | Attending: Medical Oncology

## 2021-03-08 ENCOUNTER — Telehealth
Admit: 2021-03-08 | Payer: PRIVATE HEALTH INSURANCE | Attending: Student in an Organized Health Care Education/Training Program

## 2021-03-08 ENCOUNTER — Ambulatory Visit: Admit: 2021-03-08 | Payer: BLUE CROSS/BLUE SHIELD

## 2021-03-08 NOTE — Telephone Encounter
Received call from [facility] in regards to patient being seen by Dr Cherly Hensen, requesting for f/u, mentioned that your next available is Monday 10/3, states that they will let patient know and should be discharged today. FYI.

## 2021-03-12 ENCOUNTER — Telehealth
Admit: 2021-03-12 | Payer: PRIVATE HEALTH INSURANCE | Attending: Student in an Organized Health Care Education/Training Program

## 2021-03-12 NOTE — Telephone Encounter
Patient called in regards to trying to get an appointment before October 3rd. Patient just got out of the hospital and is still not feeling great. While in the hospital, the patients blood went down to 6 and they needed a blood transfusion and even thought they are now out of the hospital, they still feel off and would just like to have their labs checked again and get some advice from Dr. Kennedy Bucker on where they go from there. Patient can be reached at [phone]

## 2021-03-12 NOTE — Telephone Encounter
Discussed with MD. Yehuda Mao office was able to re-schedule on 03/22/2021.

## 2021-03-13 ENCOUNTER — Telehealth
Admit: 2021-03-13 | Payer: PRIVATE HEALTH INSURANCE | Attending: Student in an Organized Health Care Education/Training Program

## 2021-03-13 NOTE — Telephone Encounter
Patient calling states she had an appt on 9/29 and was rescheduled for 11/17. States she was discharged from [facility] on  09/16 and needs something sooner than 11/17. Patient can be reach at [phone]

## 2021-03-22 ENCOUNTER — Ambulatory Visit
Admit: 2021-03-22 | Payer: BLUE CROSS/BLUE SHIELD | Attending: Student in an Organized Health Care Education/Training Program

## 2021-03-22 ENCOUNTER — Ambulatory Visit: Admit: 2021-03-22 | Payer: BLUE CROSS/BLUE SHIELD

## 2021-03-26 ENCOUNTER — Ambulatory Visit
Admit: 2021-03-26 | Payer: BLUE CROSS/BLUE SHIELD | Attending: Student in an Organized Health Care Education/Training Program

## 2021-03-26 ENCOUNTER — Ambulatory Visit: Admit: 2021-03-26 | Payer: BLUE CROSS/BLUE SHIELD

## 2021-04-03 ENCOUNTER — Encounter: Admit: 2021-04-03 | Payer: PRIVATE HEALTH INSURANCE

## 2021-04-05 ENCOUNTER — Encounter
Admit: 2021-04-05 | Payer: PRIVATE HEALTH INSURANCE | Attending: Student in an Organized Health Care Education/Training Program

## 2021-04-05 ENCOUNTER — Inpatient Hospital Stay: Admit: 2021-04-05 | Discharge: 2021-04-05 | Payer: BLUE CROSS/BLUE SHIELD

## 2021-04-05 ENCOUNTER — Ambulatory Visit
Admit: 2021-04-05 | Payer: BLUE CROSS/BLUE SHIELD | Attending: Student in an Organized Health Care Education/Training Program

## 2021-04-05 DIAGNOSIS — N186 End stage renal disease: Secondary | ICD-10-CM

## 2021-04-05 DIAGNOSIS — R0609 Other forms of dyspnea: Secondary | ICD-10-CM

## 2021-04-05 DIAGNOSIS — Z992 Dependence on renal dialysis: Secondary | ICD-10-CM

## 2021-04-05 DIAGNOSIS — M5136 Other intervertebral disc degeneration, lumbar region: Secondary | ICD-10-CM

## 2021-04-05 DIAGNOSIS — I10 Essential (primary) hypertension: Secondary | ICD-10-CM

## 2021-04-05 DIAGNOSIS — D631 Anemia in chronic kidney disease: Secondary | ICD-10-CM

## 2021-04-05 DIAGNOSIS — I82409 Acute embolism and thrombosis of unspecified deep veins of unspecified lower extremity: Secondary | ICD-10-CM

## 2021-04-05 DIAGNOSIS — Z9089 Acquired absence of other organs: Secondary | ICD-10-CM

## 2021-04-05 DIAGNOSIS — Z90710 Acquired absence of both cervix and uterus: Secondary | ICD-10-CM

## 2021-04-05 DIAGNOSIS — Z9889 Other specified postprocedural states: Secondary | ICD-10-CM

## 2021-04-05 DIAGNOSIS — G7000 Myasthenia gravis without (acute) exacerbation: Secondary | ICD-10-CM

## 2021-04-05 DIAGNOSIS — M1990 Unspecified osteoarthritis, unspecified site: Secondary | ICD-10-CM

## 2021-04-05 DIAGNOSIS — G5700 Lesion of sciatic nerve, unspecified lower limb: Secondary | ICD-10-CM

## 2021-04-05 DIAGNOSIS — E119 Type 2 diabetes mellitus without complications: Secondary | ICD-10-CM

## 2021-04-05 DIAGNOSIS — I739 Peripheral vascular disease, unspecified: Secondary | ICD-10-CM

## 2021-04-05 DIAGNOSIS — E785 Hyperlipidemia, unspecified: Secondary | ICD-10-CM

## 2021-04-05 DIAGNOSIS — E669 Obesity, unspecified: Secondary | ICD-10-CM

## 2021-04-05 DIAGNOSIS — D219 Benign neoplasm of connective and other soft tissue, unspecified: Secondary | ICD-10-CM

## 2021-04-05 LAB — COMPREHENSIVE METABOLIC PANEL
BKR A/G RATIO: 1.2 (ref 1.0–2.2)
BKR ALANINE AMINOTRANSFERASE (ALT): 9 U/L — ABNORMAL LOW (ref 10–35)
BKR ALBUMIN: 3.4 g/dL — ABNORMAL LOW (ref 3.6–4.9)
BKR ALKALINE PHOSPHATASE: 59 U/L (ref 9–122)
BKR ANION GAP: 13 (ref 7–17)
BKR ASPARTATE AMINOTRANSFERASE (AST): 18 U/L (ref 10–35)
BKR AST/ALT RATIO: 2
BKR BILIRUBIN TOTAL: 0.6 mg/dL (ref <=1.2)
BKR BLOOD UREA NITROGEN: 22 mg/dL (ref 8–23)
BKR BUN / CREAT RATIO: 4.2 — ABNORMAL LOW (ref 8.0–23.0)
BKR CALCIUM: 9.6 mg/dL (ref 8.8–10.2)
BKR CHLORIDE: 101 mmol/L (ref 98–107)
BKR CO2: 27 mmol/L (ref 20–30)
BKR CREATININE: 5.21 mg/dL — CR (ref 0.40–1.30)
BKR EGFR, CREATININE (CKD-EPI 2021): 8 mL/min/1.73m2 — ABNORMAL LOW (ref >=60–1.00)
BKR GLOBULIN: 2.8 g/dL (ref 2.3–3.5)
BKR GLUCOSE: 106 mg/dL — ABNORMAL HIGH (ref 70–100)
BKR POTASSIUM: 4.1 mmol/L (ref 3.3–5.1)
BKR SODIUM: 141 mmol/L — ABNORMAL LOW (ref 136–144)

## 2021-04-05 LAB — CBC WITH AUTO DIFFERENTIAL
BKR WAM ABSOLUTE IMMATURE GRANULOCYTES.: 0.04 x 1000/??L (ref 0.00–0.30)
BKR WAM ABSOLUTE LYMPHOCYTE COUNT.: 0.7 x 1000/??L (ref 0.60–3.70)
BKR WAM ABSOLUTE NRBC (2 DEC): 0 x 1000/??L (ref 0.00–1.00)
BKR WAM ANALYZER ANC: 2.14 x 1000/??L (ref 2.00–7.60)
BKR WAM BASOPHIL ABSOLUTE COUNT.: 0.03 x 1000/??L (ref 0.00–1.00)
BKR WAM BASOPHILS: 0.9 % (ref 0.0–1.4)
BKR WAM EOSINOPHIL ABSOLUTE COUNT.: 0.23 x 1000/??L (ref 0.00–1.00)
BKR WAM EOSINOPHILS: 6.8 % — ABNORMAL HIGH (ref 0.0–5.0)
BKR WAM HEMATOCRIT (2 DEC): 28.3 % — ABNORMAL LOW (ref 35.00–45.00)
BKR WAM HEMOGLOBIN: 9.5 g/dL — ABNORMAL LOW (ref 11.7–15.5)
BKR WAM IMMATURE GRANULOCYTES: 1.2 % — ABNORMAL HIGH (ref 0.0–1.0)
BKR WAM LYMPHOCYTES: 20.7 % (ref 17.0–50.0)
BKR WAM MCH (PG): 32 pg (ref 27.0–33.0)
BKR WAM MCHC: 33.6 g/dL — ABNORMAL HIGH (ref 31.0–36.0)
BKR WAM MCV: 95.3 fL (ref 80.0–100.0)
BKR WAM MONOCYTE ABSOLUTE COUNT.: 0.24 x 1000/??L (ref 0.00–1.00)
BKR WAM MONOCYTES: 7.1 % (ref 4.0–12.0)
BKR WAM MPV: 11 fL — CR (ref 8.0–12.0)
BKR WAM NEUTROPHILS: 63.3 % (ref 39.0–72.0)
BKR WAM NUCLEATED RED BLOOD CELLS: 0 % — ABNORMAL LOW (ref 0.0–1.0)
BKR WAM PLATELETS: 0.04 x 1000/??L (ref 0.00–0.30)
BKR WAM RDW-CV: 19.5 % — ABNORMAL HIGH (ref 11.0–15.0)
BKR WAM RED BLOOD CELL COUNT.: 2.97 M/??L — ABNORMAL LOW (ref 4.00–6.00)
BKR WAM WHITE BLOOD CELL COUNT: 3.4 x1000/??L — ABNORMAL LOW (ref 4.0–11.0)

## 2021-04-05 LAB — RETICULOCYTES
BKR WAM IRF: 22.3 % — ABNORMAL HIGH (ref 3.0–15.9)
BKR WAM RETICULOCYTE - ABS (3 DEC): 0.033 10??6 cells/uL (ref 0.023–0.140)
BKR WAM RETICULOCYTE COUNT PCT (1 DEC): 1.1 % (ref 0.6–2.7)
BKR WAM RETICULOCYTE HGB EQUIVALENT: 35.7 pg (ref 28.2–35.7)

## 2021-04-05 LAB — IRON AND TIBC: BKR IRON: 50 ug/dL (ref 37–145)

## 2021-04-05 LAB — FERRITIN: BKR FERRITIN: 4020 ng/mL — ABNORMAL HIGH (ref 15–150)

## 2021-04-05 LAB — HAPTOGLOBIN: BKR HAPTOGLOBIN: 88 mg/dL (ref 30–200)

## 2021-04-05 LAB — LACTATE DEHYDROGENASE: BKR LACTATE DEHYDROGENASE: 341 U/L — ABNORMAL HIGH (ref 122–241)

## 2021-04-05 NOTE — Progress Notes
RE: Lisa [name]: 11/05/1952MRN: [id number] PROVIDER: Ishmael Holter, MDDATE: 10/13/2022REASON FOR VISIT:Chief Complaint Patient presents with ? Follow-up Visit REFERRING PROVIDER: No ref. provider foundDIAGNOSIS:Encounter Diagnosis Name Primary? ? 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 Pt reports that no biopsy has been done due to the location and possibly complications; GI team is monitoring.HEMATOLOGY HISTORY:Anemia of ESRD on dialysisCURRENT TREATMENT:IV iron and ESA with dialysis.Last blood transfusion x 2 units in 8/2022ECOG: 0HISTORY OF PRESENT ILLNESS: Lisa [name] is a 70 y.o. female who is presenting for a follow-up of anemia of CKD.She has been feeling poorly.  She was recently admitted in the beginning of the week and needed blood transfusion.  Her hemoglobin dropped into the 60s.  She is very frustrated about her blood counts continuing to drop.  She receives iron and EPO with dialysis.REVIEW OF SYSTEMS:Review of Systems Constitutional: Negative for appetite change, chills, fatigue and fever. HENT: Negative for facial swelling and trouble swallowing.  Eyes: Negative for pain. Respiratory: Negative for shortness of breath.  Cardiovascular: Negative for leg swelling. Gastrointestinal: Negative for constipation, diarrhea, nausea and vomiting. Skin: Negative for rash. Neurological: Negative for dizziness and headaches. Psychiatric/Behavioral: The patient is not nervous/anxious.  All other systems reviewed and are negative.MEDICAL HISTORY:Past Medical History: Diagnosis Date ? Arthritis 02/09/2018 ? Bulging lumbar disc 02/09/2018 ? Deep vein thrombosis (DVT) of lower extremity (HC Code) 02/09/2018 ? Diabetes mellitus, type II (HC Code) 02/09/2018 ? ESRD (end stage renal disease) (HC Code) (HC CODE) (HC Code) 02/09/2018 ? Exertional dyspnea 02/09/2018 ? Fibroids 02/09/2018 ? History of thoracotomy 02/09/2018 ? HTN (hypertension) 02/09/2018 ? Hx of tonsillectomy 02/09/2018 ? Hyperlipidemia 02/09/2018 ? Myasthenia gravis (HC Code) 02/09/2018 ? Obesity 02/09/2018 ? PVD (peripheral vascular disease) (HC Code) (HC CODE) (HC Code) 03/26/2018 ? S/P TAH-BSO 02/09/2018 ? 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 azaTHIOprine (IMURAN) 50 mg tablet Take four 50 mg tablets (200 mg total) once daily.   ? BD ULTRA-FINE MINI PEN NEEDLE 31 gauge x 3/16 Ndle USE 1 PEN NEEDLE ONCE DAILY   ? carvediloL (COREG) 25 mg Immediate Release tablet TAKE 1 TABLET BY MOUTH TWICE DAILY WITH MEALS   ? cyanocobalamin, vitamin B-12, 2,500 mcg Subl DISSOLVE 1 TABLET ONCE DAILY IN THE MORNING FOR 90 DAYS   ? doxazosin (CARDURA) 4 mg tablet TAKE 1 TABLET BY MOUTH ONCE DAILY AT NIGHT   ? ELIQUIS 2.5 mg Tab tablet TAKE 1 TABLET BY MOUTH TWICE DAILY   ? FLUoxetine 20 mg tablet daily. (Patient not taking: Reported on 08/24/2020)   ? furosemide (LASIX) 40 mg tablet furosemide 40 mg tablet 1 tablet twice a day   ? insulin degludec (TRESIBA FLEXTOUCH U-200 INSULIN) 200 unit/mL (3 mL) pen INJECT 4 UNITS SUBCUTANEOUSLY EVERY DAY. DISCARD PEN 56 DAYS AFTER OPENING   ? loperamide (IMODIUM) 2 mg capsule Take 2 mg by mouth.    ? olmesartan (BENICAR) 40 MG tablet Take 40 mg by mouth daily.   ? pravastatin (PRAVACHOL) 80 mg tablet TAKE 1 TABLET BY MOUTH ONCE DAILY   ? predniSONE (DELTASONE) 1 mg tablet daily.   ? sertraline (ZOLOFT) 50 mg tablet TAKE 1 TABLET BY MOUTH ONCE DAILY   No current facility-administered medications for this visit. SURGICAL HISTORY: No past surgical history on file.SOCIAL HISTORY:Social History Socioeconomic History ? Marital status:  Single Tobacco Use ? Smoking status: Never Smoker Substance and Sexual Activity ? Alcohol use: Not Currently FAMILY HISTORY:Family History Problem Relation Age of Onset ? Breast cancer Mother  ? Stomach cancer Brother  ? Cervical cancer Paternal Grandmother  PHYSICAL EXAM: BP (!) 164/74 (Site: l a, Position: Sitting)  - Pulse (!) 96  - Temp 97.2 ?F (36.2 ?C) (Temporal)  - Resp 18  - Wt 58.5 kg  - SpO2 96%  - BMI 24.37 kg/m?  GEN: appears well, NAD, looks fatiguedHEENT: NC/AT, PERRL, OP clear, MMMNeck: No cervical LADPULM: CTABCV: RRR, no m/r/gGI: Soft, NTNDEXT: Minimal RLE edemaNeuro: AAOx4DATA REVIEW:Lab Results Component Value Date  WBC 3.4 (L) 04/05/2021  HGB 9.5 (L) 04/05/2021  HCT 28.30 (L) 04/05/2021  MCV 95.3 04/05/2021  PLT 256 04/05/2021   Chemistry    Component Value Date/Time  NA 141 04/05/2021 0928  K 4.1 04/05/2021 0928  CL 101 04/05/2021 0928  CO2 27 04/05/2021 0928  BUN 22 04/05/2021 0928  CREATININE 5.21 (HH) 04/05/2021 0928  GLU 106 (H) 04/05/2021 0928  PROT 6.2 (L) 04/05/2021 0928    Component Value Date/Time  CALCIUM 9.6 04/05/2021 0928  ALKPHOS 59 04/05/2021 0928  AST 18 04/05/2021 0928  ALT 9 (L) 04/05/2021 0928  BILITOT 0.6 04/05/2021 0928  ALBUMIN 3.4 (L) 04/05/2021 0928  Lab Results Component Value Date  IRON 50 04/05/2021  TIBC  04/05/2021    Comment:    Result not valid; Ferritin is >1200 ng/mL.  FERRITIN 4,020 (H) 04/05/2021 Lab Results Component Value Date  RETICCTPCT 0.5 (L) 02/15/2021 Lab Results Component Value Date  VITAMINB12 1,778 (H) 02/15/2021 U/S: 8/11/22COMPARISON: Correlation is made with prior study performed 04/11/2020. Ultrasound exam of the right lower extremity deep venous system from the groin to the popliteal fossa with high resolution B-Mode imaging, color flow Doppler imaging and Doppler spectral analysis without and with transducer venous compression and with venous flow augmentation. A duplicated right femoral vein is present. The right common femoral vein demonstrates normal compressibility, normal patency and unidirectional color flow, normal flow with respiratory phasicity and normal flow augmentation responses. A duplicated femoral vein is present. Grayscale interrogation reveals no compression of a femoral vein. Color Doppler shows no flow through this vessel. The findings are compatible with occlusion of one of the femoral veins. Popliteal vein shows incomplete compression and color flow compatible with nonocclusive thrombus. Color Doppler shows incomplete flow in the trifurcation extending to the posterior tibial and peroneal?veins. ?There is no thrombosis or occlusion. ?There is no popliteal cyst. 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 There is nonocclusive thrombus within the popliteal vein as well as the peroneal and posterior tibial veins in the calf. IMPRESSION & PLAN:Lisa [name] is a 70 y.o. 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.-We will send additional workup for anemia including LDH, haptoglobin, blood smear, flow cytometry, reticulocyte-continue ESA with dialysis, confirmed she is receiving this-I would stop IV iron with dialysis since her ferritin is in the 4000 range especially if she is going to be receiving fairly frequent transfusions-I did mention the prospect of a bone marrow biopsy which may help to clarify her anemia  and leukopenia at this point, she states she had a bone marrow biopsy when she was in her 38s and her understanding was this was related somehow to her myasthenia workup but she is not totally sure.2)DVTs:-recent DVT not called chronic or acute-on warfarin-warfarin monitoring with INR 2-3, she has INR titration in dialysis facility3)IPMN:-followed by PCP, Dr. Suzan Slick, stable in 8/2022Visit in 1 monthMichael Huston Foley, MDYale Cancer CenterAsst. Prof. Medicine, [facility] UniversitySm[facility] the day of this patient's encounter, a total of 36 minutes was personally spent by me.  This does not include any resident/fellow teaching time, or any time spent performing a procedural service.

## 2021-04-06 LAB — MDS PLUS PANEL
BKR CD10+CD33+: 0.5 %
BKR CD10+CD34+: 0.6 %
BKR CD11B+CD16+: 63.4 %
BKR CD13+CD11B+: 80 %
BKR CD14+CD33+: 53 %
BKR CD14-CD64+: 28.5 %
BKR CD19+: 2.7 % — ABNORMAL LOW (ref 3.17–26.01)
BKR CD19+CD38+: 0.8 %
BKR CD19+KAPPA+ MONO GATE: 0 %
BKR CD19+KAPPA+ MYELOID GATE: 0.3 %
BKR CD19+KAPPA+: 26.7 %
BKR CD19+LAMBDA+ MONO GATE: 0 %
BKR CD19+LAMBDA+ MYELOID GATE: 0.5 %
BKR CD19+LAMBDA+: 28.3 %
BKR CD3+CD16/56+: 2 %
BKR CD3+CD16/56-: 84.9 %
BKR CD3+CD4+ MONO GATE: 1.5 %
BKR CD3+CD4+: 76.3 % — ABNORMAL HIGH (ref 26.95–64.80)
BKR CD3+CD4+CD8+: 1.2 %
BKR CD3+CD5+ MONO GATE: 1.3 %
BKR CD3+CD5+ MYELOID GATE: 0.9 %
BKR CD3+CD5+: 86.7 %
BKR CD3+CD8+ MONO GATE: 1.1 %
BKR CD3+CD8+: 12.9 % (ref 7.52–43.41)
BKR CD3-CD16/56+: 7.6 % (ref 5.56–33.68)
BKR CD3-CD4+: 46.8 %
BKR CD33+ MONO GATE: 61.6 %
BKR CD33+: 4.6 %
BKR CD33+CD10+: 0.1 %
BKR CD33+CD34+: 2.1 %
BKR CD33+HLADR-: 0.1 %
BKR CD34+: 2.6 %
BKR CD34+CD10+: 0.5 %
BKR CD34+CD117+ MONO GATE: 1.1 %
BKR CD34+CD117+: 0.3 %
BKR CD34+CD33+ MONO GATE: 3 %
BKR CD34+CD64+ MONO GATE: 3.4 %
BKR CD34+CD64+: 1.5 %
BKR CD34+HLADR+: 0.7 %
BKR CD4/CD8 RATIO: 5.91 (ref 0.62–7.79)
BKR CD45+CD10+CD33-: 20 %
BKR CD45+CD10-CD33+: 2.5 %
BKR CD45+CD11B+CD13+: 92.6 %
BKR CD45+CD14+CD64+: 2.7 %
BKR CD45+CD16+CD11B+: 91.8 %
BKR CD45+CD16.56+CD3-: 1.9 %
BKR CD45+CD33+HLADR+: 2.2 %
BKR CD45+CD34+CD10+: 0.9 %
BKR CD45+CD34+CD117+: 1.3 %
BKR CD45+CD34+CD33+: 1 %
BKR CD45+CD34+CD7+: 0.8 %
BKR CD45+CD56+CD38-: 0 %
BKR CD56+: 0 %
BKR CD56+CD38+: 1.4 %
BKR CD64+: 78.7 %
BKR CD64+CD14+ MONO GATE: 54.8 %
BKR CD64+CD14+: 0.8 %
BKR CD7+CD117+: 0.3 %
BKR CD7+CD117- MONO GATE: 0.1 %
BKR CD7+CD117-: 48.1 %
BKR HLADR+CD33+: 50.8 %

## 2021-04-06 LAB — FLOW CYTOMETRY - LEUKEMIA/LYMPHOMA/NEOPLASIA, BLOOD

## 2021-04-06 LAB — BLOOD SMEAR MD INTERPRETATION     (YH)

## 2021-04-12 ENCOUNTER — Ambulatory Visit
Admit: 2021-04-12 | Payer: BLUE CROSS/BLUE SHIELD | Attending: Student in an Organized Health Care Education/Training Program

## 2021-04-12 ENCOUNTER — Ambulatory Visit: Admit: 2021-04-12 | Payer: BLUE CROSS/BLUE SHIELD

## 2021-04-12 ENCOUNTER — Encounter
Admit: 2021-04-12 | Payer: PRIVATE HEALTH INSURANCE | Attending: Student in an Organized Health Care Education/Training Program

## 2021-04-12 ENCOUNTER — Encounter: Admit: 2021-04-12 | Payer: PRIVATE HEALTH INSURANCE

## 2021-04-12 ENCOUNTER — Inpatient Hospital Stay: Admit: 2021-04-12 | Discharge: 2021-04-12 | Payer: BLUE CROSS/BLUE SHIELD

## 2021-04-12 DIAGNOSIS — R0609 Other forms of dyspnea: Secondary | ICD-10-CM

## 2021-04-12 DIAGNOSIS — E119 Type 2 diabetes mellitus without complications: Secondary | ICD-10-CM

## 2021-04-12 DIAGNOSIS — N186 End stage renal disease: Secondary | ICD-10-CM

## 2021-04-12 DIAGNOSIS — G5700 Lesion of sciatic nerve, unspecified lower limb: Secondary | ICD-10-CM

## 2021-04-12 DIAGNOSIS — I82409 Acute embolism and thrombosis of unspecified deep veins of unspecified lower extremity: Secondary | ICD-10-CM

## 2021-04-12 DIAGNOSIS — M5136 Other intervertebral disc degeneration, lumbar region: Secondary | ICD-10-CM

## 2021-04-12 DIAGNOSIS — G7000 Myasthenia gravis without (acute) exacerbation: Secondary | ICD-10-CM

## 2021-04-12 DIAGNOSIS — D631 Anemia in chronic kidney disease: Secondary | ICD-10-CM

## 2021-04-12 DIAGNOSIS — Z9889 Other specified postprocedural states: Secondary | ICD-10-CM

## 2021-04-12 DIAGNOSIS — M1990 Unspecified osteoarthritis, unspecified site: Secondary | ICD-10-CM

## 2021-04-12 DIAGNOSIS — I739 Peripheral vascular disease, unspecified: Secondary | ICD-10-CM

## 2021-04-12 DIAGNOSIS — D219 Benign neoplasm of connective and other soft tissue, unspecified: Secondary | ICD-10-CM

## 2021-04-12 DIAGNOSIS — E669 Obesity, unspecified: Secondary | ICD-10-CM

## 2021-04-12 DIAGNOSIS — Z9089 Acquired absence of other organs: Secondary | ICD-10-CM

## 2021-04-12 DIAGNOSIS — Z992 Dependence on renal dialysis: Secondary | ICD-10-CM

## 2021-04-12 DIAGNOSIS — Z90710 Acquired absence of both cervix and uterus: Secondary | ICD-10-CM

## 2021-04-12 DIAGNOSIS — E785 Hyperlipidemia, unspecified: Secondary | ICD-10-CM

## 2021-04-12 DIAGNOSIS — I10 Essential (primary) hypertension: Secondary | ICD-10-CM

## 2021-04-12 LAB — CBC WITH AUTO DIFFERENTIAL
BKR WAM ABSOLUTE IMMATURE GRANULOCYTES.: 0.07 x 1000/??L (ref 0.00–0.30)
BKR WAM ABSOLUTE LYMPHOCYTE COUNT.: 0.63 x 1000/??L (ref 0.60–3.70)
BKR WAM ABSOLUTE NRBC (2 DEC): 0 x 1000/??L (ref 0.00–1.00)
BKR WAM ANALYZER ANC: 2.63 x 1000/??L (ref 2.00–7.60)
BKR WAM BASOPHIL ABSOLUTE COUNT.: 0.03 x 1000/??L (ref 0.00–1.00)
BKR WAM BASOPHILS: 0.8 % (ref 0.0–1.4)
BKR WAM EOSINOPHIL ABSOLUTE COUNT.: 0.13 x 1000/??L (ref 0.00–1.00)
BKR WAM EOSINOPHILS: 3.6 % (ref 0.0–5.0)
BKR WAM HEMATOCRIT (2 DEC): 30.2 % — ABNORMAL LOW (ref 35.00–45.00)
BKR WAM HEMOGLOBIN: 9.7 g/dL — ABNORMAL LOW (ref 11.7–15.5)
BKR WAM IMMATURE GRANULOCYTES: 1.9 % — ABNORMAL HIGH (ref 0.0–1.0)
BKR WAM LYMPHOCYTES: 17.3 % (ref 17.0–50.0)
BKR WAM MCH (PG): 31.9 pg — ABNORMAL HIGH (ref 27.0–33.0)
BKR WAM MCHC: 32.1 g/dL — ABNORMAL HIGH (ref 31.0–36.0)
BKR WAM MCV: 99.3 fL — ABNORMAL HIGH (ref 80.0–100.0)
BKR WAM MONOCYTE ABSOLUTE COUNT.: 0.16 x 1000/??L (ref 0.00–1.00)
BKR WAM MONOCYTES: 4.4 % (ref 4.0–12.0)
BKR WAM MPV: 11.5 fL (ref 8.0–12.0)
BKR WAM NEUTROPHILS: 72 % (ref 39.0–72.0)
BKR WAM NUCLEATED RED BLOOD CELLS: 0 % (ref 0.0–1.0)
BKR WAM PLATELETS: 217 x1000/??L (ref 150–420)
BKR WAM RDW-CV: 19.4 % — ABNORMAL HIGH (ref 11.0–15.0)
BKR WAM RED BLOOD CELL COUNT.: 3.04 M/??L — ABNORMAL LOW (ref 4.00–6.00)
BKR WAM WHITE BLOOD CELL COUNT: 3.7 x1000/??L — ABNORMAL LOW (ref 4.0–11.0)

## 2021-04-12 NOTE — Progress Notes
Pt arrived for a CBC check and HGB 9.7. No need for a transfusion. Pt will return weekly for CBC check. Reinforced to call office with any issues or symptom concerns.

## 2021-04-19 ENCOUNTER — Telehealth
Admit: 2021-04-19 | Payer: PRIVATE HEALTH INSURANCE | Attending: Student in an Organized Health Care Education/Training Program

## 2021-04-19 ENCOUNTER — Inpatient Hospital Stay: Admit: 2021-04-19 | Discharge: 2021-04-19 | Payer: BLUE CROSS/BLUE SHIELD

## 2021-04-19 ENCOUNTER — Ambulatory Visit: Admit: 2021-04-19 | Payer: BLUE CROSS/BLUE SHIELD

## 2021-04-19 ENCOUNTER — Encounter
Admit: 2021-04-19 | Payer: PRIVATE HEALTH INSURANCE | Attending: Student in an Organized Health Care Education/Training Program

## 2021-04-19 DIAGNOSIS — N186 End stage renal disease: Secondary | ICD-10-CM

## 2021-04-19 DIAGNOSIS — D631 Anemia in chronic kidney disease: Secondary | ICD-10-CM

## 2021-04-19 DIAGNOSIS — Z992 Dependence on renal dialysis: Secondary | ICD-10-CM

## 2021-04-19 DIAGNOSIS — D696 Thrombocytopenia, unspecified: Secondary | ICD-10-CM

## 2021-04-19 LAB — CBC WITH AUTO DIFFERENTIAL
BKR WAM ABSOLUTE IMMATURE GRANULOCYTES.: 0.04 x 1000/??L (ref 0.00–0.30)
BKR WAM ABSOLUTE LYMPHOCYTE COUNT.: 0.72 x 1000/ÂµL (ref 0.60–3.70)
BKR WAM ABSOLUTE NRBC (2 DEC): 0.02 x 1000/ÂµL (ref 0.00–1.00)
BKR WAM ANALYZER ANC: 3.32 x 1000/??L — ABNORMAL LOW (ref 2.00–7.60)
BKR WAM BASOPHIL ABSOLUTE COUNT.: 0.05 x 1000/??L (ref 0.00–1.00)
BKR WAM BASOPHILS: 1.1 % (ref 0.0–1.4)
BKR WAM EOSINOPHIL ABSOLUTE COUNT.: 0.22 x 1000/ÂµL (ref 0.00–1.00)
BKR WAM HEMOGLOBIN: 9.1 g/dL — ABNORMAL LOW (ref 11.7–15.5)
BKR WAM IMMATURE GRANULOCYTES: 0.9 % (ref 0.0–1.0)
BKR WAM LYMPHOCYTES: 16.2 % — ABNORMAL LOW (ref 17.0–50.0)
BKR WAM MCH (PG): 31.7 pg (ref 27.0–33.0)
BKR WAM MCHC: 32.2 g/dL (ref 31.0–36.0)
BKR WAM MCV: 98.6 fL (ref 80.0–100.0)
BKR WAM MONOCYTE ABSOLUTE COUNT.: 0.1 x 1000/ÂµL (ref 0.00–1.00)
BKR WAM NEUTROPHILS: 74.7 % — ABNORMAL HIGH (ref 39.0–72.0)
BKR WAM NUCLEATED RED BLOOD CELLS: 0.4 % (ref 0.0–1.0)
BKR WAM PLATELETS: 68 x1000/??L — ABNORMAL LOW (ref 150–420)
BKR WAM RDW-CV: 19.9 % — ABNORMAL HIGH (ref 11.0–15.0)
BKR WAM RED BLOOD CELL COUNT.: 2.87 M/??L — ABNORMAL LOW (ref 4.00–6.00)
BKR WAM WHITE BLOOD CELL COUNT: 4.5 x1000/??L (ref 4.0–11.0)

## 2021-04-19 LAB — PT/INR AND PTT (BH GH L LMW YH)
BKR INR: 2.22 — ABNORMAL HIGH (ref 0.92–1.19)
BKR PARTIAL THROMBOPLASTIN TIME: 35 s — ABNORMAL HIGH (ref 23.0–31.4)
BKR PROTHROMBIN TIME: 22.1 s — ABNORMAL HIGH (ref 9.6–12.3)

## 2021-04-19 LAB — IMMATURE PLATELET FRACTION (BH GH YH)
BKR WAM HEMATOCRIT (2 DEC): 3.5 x1000/??L — ABNORMAL LOW (ref 35.00–<20.0)
BKR WAM IPF, ABSOLUTE: 3.5 x1000/ÂµL (ref <20.0)
BKR WAM IPF: 5.1 % (ref 1.2–8.6)

## 2021-04-19 NOTE — Telephone Encounter
Spoke to Urich.  She is not started any new medications.  Platelet count came back at 68 which is new.  I feel this may be spurious.  She will come in again on Monday for repeat and I will send a blood smear with that.  She is not experiencing bleeding.

## 2021-04-19 NOTE — Progress Notes
Reports for cbc check. Denies problems. Hgb= 9.1 platelet count has dropped to 68. Dr. Kennedy Bucker notified.Electronically Signed by Alfred Levins, RN, April 19, 2021

## 2021-04-23 ENCOUNTER — Ambulatory Visit: Admit: 2021-04-23 | Payer: BLUE CROSS/BLUE SHIELD

## 2021-04-23 ENCOUNTER — Inpatient Hospital Stay: Admit: 2021-04-23 | Discharge: 2021-04-23 | Payer: BLUE CROSS/BLUE SHIELD

## 2021-04-23 ENCOUNTER — Encounter
Admit: 2021-04-23 | Payer: PRIVATE HEALTH INSURANCE | Attending: Student in an Organized Health Care Education/Training Program

## 2021-04-23 DIAGNOSIS — D696 Thrombocytopenia, unspecified: Secondary | ICD-10-CM

## 2021-04-23 LAB — COMPREHENSIVE METABOLIC PANEL
BKR A/G RATIO: 1.4 (ref 1.0–2.2)
BKR ALANINE AMINOTRANSFERASE (ALT): 8 U/L — ABNORMAL LOW (ref 10–35)
BKR ALBUMIN: 3.4 g/dL — ABNORMAL LOW (ref 3.6–4.9)
BKR ALKALINE PHOSPHATASE: 70 U/L (ref 9–122)
BKR ASPARTATE AMINOTRANSFERASE (AST): 12 U/L (ref 10–35)
BKR AST/ALT RATIO: 1.5
BKR BILIRUBIN TOTAL: 0.6 mg/dL (ref 0.00–<=1.2)
BKR BLOOD UREA NITROGEN: 39 mg/dL — ABNORMAL HIGH (ref 8–23)
BKR BUN / CREAT RATIO: 5.1 — ABNORMAL LOW (ref 8.0–23.0)
BKR CALCIUM: 9.4 mg/dL — ABNORMAL HIGH (ref 8.8–10.2)
BKR CHLORIDE: 101 mmol/L (ref 98–107)
BKR CO2: 26 mmol/L (ref 20–30)
BKR CREATININE: 7.7 mg/dL — CR (ref 0.40–1.30)
BKR EGFR, CREATININE (CKD-EPI 2021): 5 mL/min/{1.73_m2} — ABNORMAL LOW (ref >=60)
BKR GLOBULIN: 2.5 g/dL (ref 2.3–3.5)
BKR GLUCOSE: 115 mg/dL — ABNORMAL HIGH (ref 70–100)
BKR POTASSIUM: 4.3 mmol/L (ref 3.3–5.1)
BKR PROTEIN TOTAL: 5.9 g/dL — ABNORMAL LOW (ref 6.6–8.7)
BKR SODIUM: 140 mmol/L — ABNORMAL HIGH (ref 136–144)

## 2021-04-23 LAB — CBC WITH AUTO DIFFERENTIAL
BKR ANION GAP: 23.4 % (ref 17.0–50.0)
BKR WAM ABSOLUTE IMMATURE GRANULOCYTES.: 0.03 x 1000/??L (ref 0.00–0.30)
BKR WAM ABSOLUTE LYMPHOCYTE COUNT.: 0.66 x 1000/??L — ABNORMAL LOW (ref 0.60–3.70)
BKR WAM ABSOLUTE NRBC (2 DEC): 0 x 1000/??L — ABNORMAL LOW (ref 0.00–1.00)
BKR WAM ANALYZER ANC: 1.79 x 1000/??L — ABNORMAL LOW (ref 2.00–7.60)
BKR WAM BASOPHIL ABSOLUTE COUNT.: 0.03 x 1000/??L — ABNORMAL LOW (ref 0.00–1.00)
BKR WAM BASOPHILS: 1.1 % (ref 0.0–1.4)
BKR WAM EOSINOPHIL ABSOLUTE COUNT.: 0.26 x 1000/??L (ref 0.00–1.00)
BKR WAM EOSINOPHILS: 9.2 % — ABNORMAL HIGH (ref 0.0–5.0)
BKR WAM HEMATOCRIT (2 DEC): 24.9 % — ABNORMAL LOW (ref 35.00–45.00)
BKR WAM HEMOGLOBIN: 8.1 g/dL — ABNORMAL LOW (ref 11.7–15.5)
BKR WAM IMMATURE GRANULOCYTES: 1.1 % — ABNORMAL HIGH (ref 0.0–1.0)
BKR WAM LYMPHOCYTES: 23.4 % (ref 17.0–50.0)
BKR WAM MCH (PG): 32 pg (ref 27.0–33.0)
BKR WAM MCHC: 32.5 g/dL (ref 31.0–36.0)
BKR WAM MCV: 98.4 fL (ref 80.0–100.0)
BKR WAM MONOCYTE ABSOLUTE COUNT.: 0.05 x 1000/??L (ref 0.00–1.00)
BKR WAM MONOCYTES: 1.8 % — ABNORMAL LOW (ref 4.0–12.0)
BKR WAM MPV: 101 mmol/L (ref 98–107)
BKR WAM NEUTROPHILS: 63.4 % (ref 39.0–72.0)
BKR WAM NUCLEATED RED BLOOD CELLS: 0 % — ABNORMAL LOW (ref 0.0–1.0)
BKR WAM PLATELETS: 65 x1000/??L — ABNORMAL LOW (ref 150–420)
BKR WAM RDW-CV: 20.7 % — ABNORMAL HIGH (ref 11.0–15.0)
BKR WAM RED BLOOD CELL COUNT.: 2.53 M/??L — ABNORMAL LOW (ref 4.00–6.00)
BKR WAM WHITE BLOOD CELL COUNT: 2.8 x1000/??L — ABNORMAL LOW (ref 4.0–11.0)

## 2021-04-23 LAB — IMMATURE PLATELET FRACTION (BH GH YH)
BKR WAM IPF, ABSOLUTE: 3.4 x1000/??L (ref 80.0–<20.0)
BKR WAM IPF: 5.3 % — ABNORMAL LOW (ref 1.2–8.6)

## 2021-04-23 LAB — HAPTOGLOBIN: BKR HAPTOGLOBIN: <10 mg/dL — ABNORMAL LOW (ref 30–200)

## 2021-04-23 LAB — LACTATE DEHYDROGENASE: BKR LACTATE DEHYDROGENASE: 279 U/L — ABNORMAL HIGH (ref 122–241)

## 2021-04-24 ENCOUNTER — Encounter
Admit: 2021-04-24 | Payer: PRIVATE HEALTH INSURANCE | Attending: Student in an Organized Health Care Education/Training Program

## 2021-04-24 DIAGNOSIS — D594 Other nonautoimmune hemolytic anemias: Secondary | ICD-10-CM

## 2021-04-24 LAB — BLOOD SMEAR MD INTERPRETATION     (YH): BKR WAM MONOCYTES: 2.2 % — ABNORMAL LOW (ref 4.0–12.0)

## 2021-04-26 ENCOUNTER — Inpatient Hospital Stay: Admit: 2021-04-26 | Discharge: 2021-04-26 | Payer: BLUE CROSS/BLUE SHIELD

## 2021-04-26 ENCOUNTER — Encounter
Admit: 2021-04-26 | Payer: PRIVATE HEALTH INSURANCE | Attending: Student in an Organized Health Care Education/Training Program

## 2021-04-26 ENCOUNTER — Ambulatory Visit: Admit: 2021-04-26 | Payer: BLUE CROSS/BLUE SHIELD

## 2021-04-26 ENCOUNTER — Encounter: Admit: 2021-04-26 | Payer: PRIVATE HEALTH INSURANCE

## 2021-04-26 DIAGNOSIS — I82409 Acute embolism and thrombosis of unspecified deep veins of unspecified lower extremity: Secondary | ICD-10-CM

## 2021-04-26 DIAGNOSIS — G7000 Myasthenia gravis without (acute) exacerbation: Secondary | ICD-10-CM

## 2021-04-26 DIAGNOSIS — I10 Essential (primary) hypertension: Secondary | ICD-10-CM

## 2021-04-26 DIAGNOSIS — R0609 Other forms of dyspnea: Secondary | ICD-10-CM

## 2021-04-26 DIAGNOSIS — D219 Benign neoplasm of connective and other soft tissue, unspecified: Secondary | ICD-10-CM

## 2021-04-26 DIAGNOSIS — I739 Peripheral vascular disease, unspecified: Secondary | ICD-10-CM

## 2021-04-26 DIAGNOSIS — D594 Other nonautoimmune hemolytic anemias: Secondary | ICD-10-CM

## 2021-04-26 DIAGNOSIS — E669 Obesity, unspecified: Secondary | ICD-10-CM

## 2021-04-26 DIAGNOSIS — N186 End stage renal disease: Secondary | ICD-10-CM

## 2021-04-26 DIAGNOSIS — Z9089 Acquired absence of other organs: Secondary | ICD-10-CM

## 2021-04-26 DIAGNOSIS — E785 Hyperlipidemia, unspecified: Secondary | ICD-10-CM

## 2021-04-26 DIAGNOSIS — Z90710 Acquired absence of both cervix and uterus: Secondary | ICD-10-CM

## 2021-04-26 DIAGNOSIS — M1990 Unspecified osteoarthritis, unspecified site: Secondary | ICD-10-CM

## 2021-04-26 DIAGNOSIS — G5700 Lesion of sciatic nerve, unspecified lower limb: Secondary | ICD-10-CM

## 2021-04-26 DIAGNOSIS — E119 Type 2 diabetes mellitus without complications: Secondary | ICD-10-CM

## 2021-04-26 DIAGNOSIS — Z9889 Other specified postprocedural states: Secondary | ICD-10-CM

## 2021-04-26 DIAGNOSIS — M5136 Other intervertebral disc degeneration, lumbar region: Secondary | ICD-10-CM

## 2021-04-26 LAB — COMPREHENSIVE METABOLIC PANEL
BKR A/G RATIO: 1.8 (ref 1.0–2.2)
BKR ALANINE AMINOTRANSFERASE (ALT): 10 U/L (ref 10–35)
BKR ALBUMIN: 3.7 g/dL (ref 3.6–4.9)
BKR ALKALINE PHOSPHATASE: 62 U/L (ref 9–122)
BKR ANION GAP: 10 g/dL (ref 7–17)
BKR ASPARTATE AMINOTRANSFERASE (AST): 16 U/L — ABNORMAL LOW (ref 10–35)
BKR AST/ALT RATIO: 1.6
BKR BILIRUBIN TOTAL: 0.8 mg/dL (ref 0.0–<=1.2)
BKR BLOOD UREA NITROGEN: 21 mg/dL (ref 8–23)
BKR BUN / CREAT RATIO: 4.4 — ABNORMAL LOW (ref 8.0–23.0)
BKR CALCIUM: 9.3 mg/dL (ref 8.8–10.2)
BKR CHLORIDE: 104 mmol/L (ref 98–107)
BKR CO2: 28 mmol/L (ref 20–30)
BKR CREATININE: 4.8 mg/dL — ABNORMAL HIGH (ref 0.40–1.30)
BKR EGFR, CREATININE (CKD-EPI 2021): 9 mL/min/{1.73_m2} — ABNORMAL LOW (ref >=60)
BKR GLOBULIN: 2.1 g/dL — ABNORMAL LOW (ref 2.3–3.5)
BKR GLUCOSE: 147 mg/dL — ABNORMAL HIGH (ref 70–100)
BKR POTASSIUM: 4.2 mmol/L — ABNORMAL LOW (ref 3.3–5.1)
BKR PROTEIN TOTAL: 5.8 g/dL — ABNORMAL LOW (ref 6.6–8.7)
BKR SODIUM: 142 mmol/L (ref 136–144)
BKR WAM EOSINOPHILS: 3.7 g/dL — ABNORMAL HIGH (ref 3.6–4.9)
BKR WAM RDW-CV: 147 mg/dL — ABNORMAL HIGH (ref 70–100)

## 2021-04-26 LAB — CBC WITH AUTO DIFFERENTIAL
BKR WAM ABSOLUTE IMMATURE GRANULOCYTES.: 0.03 x 1000/??L (ref 0.00–0.30)
BKR WAM ABSOLUTE LYMPHOCYTE COUNT.: 0.63 x 1000/??L (ref 0.60–3.70)
BKR WAM ABSOLUTE NRBC (2 DEC): 0 x 1000/??L (ref 0.00–1.00)
BKR WAM ANALYZER ANC: 1.48 x 1000/??L — ABNORMAL LOW (ref 2.00–7.60)
BKR WAM BASOPHIL ABSOLUTE COUNT.: 0.02 x 1000/??L — ABNORMAL LOW (ref 0.00–1.00)
BKR WAM BASOPHILS: 0.8 % (ref 0.0–1.4)
BKR WAM EOSINOPHIL ABSOLUTE COUNT.: 0.3 x 1000/??L (ref 0.00–1.00)
BKR WAM HEMATOCRIT (2 DEC): 23.6 % — ABNORMAL LOW (ref 35.00–45.00)
BKR WAM HEMOGLOBIN: 7.6 g/dL — ABNORMAL LOW (ref 11.7–15.5)
BKR WAM IMMATURE GRANULOCYTES: 1.2 % — ABNORMAL HIGH (ref 0.0–1.0)
BKR WAM LYMPHOCYTES: 24.8 % — ABNORMAL LOW (ref 17.0–50.0)
BKR WAM MCH (PG): 32.2 pg (ref 27.0–33.0)
BKR WAM MCHC: 32.2 g/dL (ref 31.0–36.0)
BKR WAM MCV: 100 fL (ref 80.0–100.0)
BKR WAM MONOCYTE ABSOLUTE COUNT.: 0.08 x 1000/??L (ref 0.00–1.00)
BKR WAM MONOCYTES: 3.1 % — ABNORMAL LOW (ref 4.0–12.0)
BKR WAM MPV: 12.8 fL — ABNORMAL HIGH (ref 8.0–12.0)
BKR WAM NEUTROPHILS: 58.3 % (ref 39.0–72.0)
BKR WAM NUCLEATED RED BLOOD CELLS: 0 % (ref 0.0–1.0)
BKR WAM PLATELETS: 93 x1000/??L — ABNORMAL LOW (ref 150–420)
BKR WAM RED BLOOD CELL COUNT.: 2.36 M/??L — ABNORMAL LOW (ref 4.00–6.00)
BKR WAM WHITE BLOOD CELL COUNT: 2.5 x1000/??L — ABNORMAL LOW (ref 4.0–11.0)

## 2021-04-26 LAB — IMMATURE PLATELET FRACTION (BH GH YH)
BKR WAM IPF, ABSOLUTE: 6.2 x1000/??L (ref <20.0)
BKR WAM IPF: 6.7 % (ref 1.2–8.6)

## 2021-04-26 LAB — LACTATE DEHYDROGENASE: BKR LACTATE DEHYDROGENASE: 310 U/L — ABNORMAL HIGH (ref 122–241)

## 2021-04-26 NOTE — Progress Notes
Arrived for RN only visit for CBC check.  Hgb 7.6, pt reports increased fatigue.  Dr. Kennedy Bucker notified.  Pt set up to receive PRBCs x 2 units @ [facility] on Monday.  Paperwork faxed to [facility].  Returns next Thursday for provider visit and bloodwork.  Electronically Signed by Lottie Rater, RN, April 26, 2021

## 2021-04-27 LAB — C3 COMPLEMENT: BKR C3 COMPLEMENT: 81 mg/dL — ABNORMAL LOW (ref 90–180)

## 2021-04-27 LAB — BETA-2 GLYCOPROTEIN 1 ABS,IGG, IGA, IGM
BKR ACA IGA: <2.4 U/mL (ref <7.0)
BKR B2G IGA: 3.8 U/mL (ref <7.0)
BKR B2G IGG: 1.8 U/mL (ref <7.0)
BKR B2G IGM: <2.4 U/mL (ref <7.0)

## 2021-04-27 LAB — HAPTOGLOBIN: BKR HAPTOGLOBIN: <10 mg/dL — ABNORMAL LOW (ref 30–200)

## 2021-04-27 LAB — CARDIOLIPIN ABS, IGA, IGG, IGM     (BH GH LMW Q YH)
BKR ACA IGG: 1.7 GPL U/mL (ref <10.0)
BKR ACA IGM: <0.9 [MPL'U]/mL (ref <10.0)

## 2021-04-27 LAB — PROTIME AND INR
BKR INR: 1.15 (ref 0.92–1.19)
BKR PROTHROMBIN TIME: 11.9 s (ref 9.6–12.3)

## 2021-04-27 LAB — C4 COMPLEMENT     (BH GH L LMW YH): BKR C4 COMPLEMENT: 15 mg/dL (ref 10–40)

## 2021-04-27 LAB — PARTIAL THROMBOPLASTIN TIME     (BH GH LMW Q YH): BKR PARTIAL THROMBOPLASTIN TIME: 24.6 s (ref 23.0–31.4)

## 2021-04-30 LAB — LUPUS ANTICOAGULANT     (BH GH L LMW YH)
BKR DRV NORMALIZED RATIO: 1.01 MPL U/mL (ref <=1.20)
BKR SCT NORMALIZED RATIO: 0.97 (ref <=1.20)

## 2021-04-30 LAB — ADAMTS13 ACTIVITY W/REFLEX TO INHIBITOR (BH GH LMW Q YH): BKR ADAMTS13 ACTIVITY: 99 % Activity (ref >=67)

## 2021-04-30 LAB — SPECIAL COAGULATION MD INTERPRETATION (LAB ORDERABLE ONLY) (GH YH)

## 2021-05-03 ENCOUNTER — Inpatient Hospital Stay: Admit: 2021-05-03 | Discharge: 2021-05-03 | Payer: BLUE CROSS/BLUE SHIELD

## 2021-05-03 ENCOUNTER — Ambulatory Visit: Admit: 2021-05-03 | Payer: BLUE CROSS/BLUE SHIELD | Attending: Medical Oncology

## 2021-05-03 ENCOUNTER — Encounter: Admit: 2021-05-03 | Payer: PRIVATE HEALTH INSURANCE | Attending: Medical Oncology

## 2021-05-03 DIAGNOSIS — D594 Other nonautoimmune hemolytic anemias: Secondary | ICD-10-CM

## 2021-05-03 DIAGNOSIS — M5136 Other intervertebral disc degeneration, lumbar region: Secondary | ICD-10-CM

## 2021-05-03 DIAGNOSIS — Z9089 Acquired absence of other organs: Secondary | ICD-10-CM

## 2021-05-03 DIAGNOSIS — D631 Anemia in chronic kidney disease: Secondary | ICD-10-CM

## 2021-05-03 DIAGNOSIS — E119 Type 2 diabetes mellitus without complications: Secondary | ICD-10-CM

## 2021-05-03 DIAGNOSIS — D219 Benign neoplasm of connective and other soft tissue, unspecified: Secondary | ICD-10-CM

## 2021-05-03 DIAGNOSIS — I739 Peripheral vascular disease, unspecified: Secondary | ICD-10-CM

## 2021-05-03 DIAGNOSIS — I82409 Acute embolism and thrombosis of unspecified deep veins of unspecified lower extremity: Secondary | ICD-10-CM

## 2021-05-03 DIAGNOSIS — Z992 Dependence on renal dialysis: Secondary | ICD-10-CM

## 2021-05-03 DIAGNOSIS — E669 Obesity, unspecified: Secondary | ICD-10-CM

## 2021-05-03 DIAGNOSIS — N185 Chronic kidney disease, stage 5: Secondary | ICD-10-CM

## 2021-05-03 DIAGNOSIS — Z9889 Other specified postprocedural states: Secondary | ICD-10-CM

## 2021-05-03 DIAGNOSIS — I10 Essential (primary) hypertension: Secondary | ICD-10-CM

## 2021-05-03 DIAGNOSIS — G7000 Myasthenia gravis without (acute) exacerbation: Secondary | ICD-10-CM

## 2021-05-03 DIAGNOSIS — N186 End stage renal disease: Secondary | ICD-10-CM

## 2021-05-03 DIAGNOSIS — Z90710 Acquired absence of both cervix and uterus: Secondary | ICD-10-CM

## 2021-05-03 DIAGNOSIS — R0609 Other forms of dyspnea: Secondary | ICD-10-CM

## 2021-05-03 DIAGNOSIS — G5700 Lesion of sciatic nerve, unspecified lower limb: Secondary | ICD-10-CM

## 2021-05-03 DIAGNOSIS — E785 Hyperlipidemia, unspecified: Secondary | ICD-10-CM

## 2021-05-03 DIAGNOSIS — M1990 Unspecified osteoarthritis, unspecified site: Secondary | ICD-10-CM

## 2021-05-03 LAB — CBC WITH AUTO DIFFERENTIAL
BKR POTASSIUM: 0.6 % (ref 0.0–1.4)
BKR WAM ABSOLUTE IMMATURE GRANULOCYTES.: 0.04 x 1000/??L — ABNORMAL LOW (ref 0.00–0.30)
BKR WAM ABSOLUTE LYMPHOCYTE COUNT.: 0.45 x 1000/??L — ABNORMAL LOW (ref 0.60–3.70)
BKR WAM ABSOLUTE NRBC (2 DEC): 0 x 1000/??L — ABNORMAL LOW (ref 0.00–1.00)
BKR WAM ANALYZER ANC: 0.96 x 1000/??L — ABNORMAL LOW (ref 2.00–7.60)
BKR WAM BASOPHIL ABSOLUTE COUNT.: 0.01 x 1000/ÂµL (ref 0.00–1.00)
BKR WAM BASOPHILS: 0.6 % (ref 0.0–1.4)
BKR WAM EOSINOPHIL ABSOLUTE COUNT.: 0.12 x 1000/??L (ref 0.00–1.00)
BKR WAM EOSINOPHILS: 7 % — ABNORMAL HIGH (ref 0.0–5.0)
BKR WAM HEMATOCRIT (2 DEC): 31 % — ABNORMAL LOW (ref 35.00–45.00)
BKR WAM HEMOGLOBIN: 10.3 g/dL — ABNORMAL LOW (ref 11.7–15.5)
BKR WAM IMMATURE GRANULOCYTES: 2.3 % — ABNORMAL HIGH (ref 0.0–1.0)
BKR WAM LYMPHOCYTES: 26.3 % (ref 17.0–50.0)
BKR WAM MCH (PG): 32.4 pg (ref 27.0–33.0)
BKR WAM MCHC: 33.2 g/dL (ref 31.0–36.0)
BKR WAM MCV: 97.5 fL (ref 80.0–100.0)
BKR WAM MONOCYTE ABSOLUTE COUNT.: 0.13 x 1000/??L — ABNORMAL HIGH (ref 0.00–1.00)
BKR WAM MONOCYTES: 7.6 % (ref 4.0–12.0)
BKR WAM MPV: 12.3 fL — ABNORMAL HIGH (ref 8.0–12.0)
BKR WAM NEUTROPHILS: 56.2 % (ref 39.0–72.0)
BKR WAM NUCLEATED RED BLOOD CELLS: 0 % (ref 0.0–1.0)
BKR WAM PLATELETS: 119 x1000/ÂµL — ABNORMAL LOW (ref 150–420)
BKR WAM RDW-CV: 20.4 % — ABNORMAL HIGH (ref 11.0–15.0)
BKR WAM RED BLOOD CELL COUNT.: 3.18 M/ÂµL — ABNORMAL LOW (ref 4.00–6.00)
BKR WAM WHITE BLOOD CELL COUNT: 1.7 x1000/ÂµL — ABNORMAL LOW (ref 4.0–11.0)

## 2021-05-03 LAB — RETICULOCYTES
BKR WAM IRF: 11.7 % (ref 3.0–15.9)
BKR WAM RETICULOCYTE - ABS (3 DEC): 0.054 10??6 cells/uL (ref 0.023–0.140)
BKR WAM RETICULOCYTE COUNT PCT (1 DEC): 1.7 % (ref 0.6–2.7)
BKR WAM RETICULOCYTE HGB EQUIVALENT: 35.7 pg (ref 28.2–35.7)

## 2021-05-03 LAB — COMPREHENSIVE METABOLIC PANEL
BKR A/G RATIO: 1.5 (ref 1.0–2.2)
BKR ALANINE AMINOTRANSFERASE (ALT): 16 U/L (ref 10–35)
BKR ALBUMIN: 3.7 g/dL (ref 3.6–4.9)
BKR ALKALINE PHOSPHATASE: 66 U/L (ref 9–122)
BKR ANION GAP: 12 (ref 7–17)
BKR ASPARTATE AMINOTRANSFERASE (AST): 17 U/L (ref 10–35)
BKR AST/ALT RATIO: 1.1
BKR BILIRUBIN TOTAL: 0.8 mg/dL (ref <=1.2)
BKR BLOOD UREA NITROGEN: 28 mg/dL — ABNORMAL HIGH (ref 8–23)
BKR BUN / CREAT RATIO: 4.3 — ABNORMAL LOW (ref 8.0–23.0)
BKR CALCIUM: 9.3 mg/dL (ref 8.8–10.2)
BKR CHLORIDE: 103 mmol/L — ABNORMAL HIGH (ref 98–107)
BKR CO2: 24 mmol/L (ref 20–30)
BKR CREATININE: 6.53 mg/dL — CR (ref 0.40–1.30)
BKR EGFR, CREATININE (CKD-EPI 2021): 6 mL/min/{1.73_m2} — ABNORMAL LOW (ref >=60)
BKR GLOBULIN: 2.4 g/dL (ref 2.3–3.5)
BKR GLUCOSE: 179 mg/dL — ABNORMAL HIGH (ref 70–100)
BKR PROTEIN TOTAL: 6.1 g/dL — ABNORMAL LOW (ref 6.6–8.7)
BKR SODIUM: 139 mmol/L (ref 136–144)

## 2021-05-03 LAB — SEDIMENTATION RATE (ESR): BKR SEDIMENTATION RATE, ERYTHROCYTE: 17 mm/hr (ref 0–20)

## 2021-05-03 LAB — LACTATE DEHYDROGENASE: BKR LACTATE DEHYDROGENASE: 305 U/L — ABNORMAL HIGH (ref 122–241)

## 2021-05-03 NOTE — Progress Notes
RE: Lisa [name]: [birth year]MRN: [id number] PROVIDER: Manuela Schwartz, APRNDATE: 11/10/2022REASON FOR VISIT:Follow-up visitREFERRING PROVIDER: No ref. provider foundDIAGNOSIS:Encounter Diagnoses Name Primary? ? Anemia due to chronic kidney disease, on chronic dialysis (HC Code) (HC CODE) (HC Code) Yes ? 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 Pt reports that no biopsy has been done due to the location and possibly complications; GI team is monitoring.HEMATOLOGY HISTORY:Anemia of ESRD on dialysisCURRENT TREATMENT:IV iron and ESA with dialysis.Last blood transfusion x 2 units 10/2022HISTORY OF PRESENT ILLNESS: Lisa [name] is a 70 y.o. female who is presenting for a follow-up of anemia of CKD and new pancytopenia.  She is currently on dialysis receiving ESA.She generally feeling better since she received blood transfusion last month.  She denies any significant bleeding or bruising.  She has daily bouts of nausea and is scheduled for endoscopy next week.  She denies any melena or blood per rectum.  Stools vacillate between constipation and diarrhea.  She denies any abdominal pain or distension.  She denies recent fever, chills, sweats.  She remains on Imuran 200 milligrams each a.m. for treatment of her myasthenia gravis through her neurologist, Dr. Marlyne Beards.REVIEW OF SYSTEMS:Review of Systems Constitutional: Negative for appetite change, chills, fatigue and fever. HENT: Negative for facial swelling and trouble swallowing.  Eyes: Negative for pain. Respiratory: Negative for shortness of breath.  Cardiovascular: Negative for leg swelling. Gastrointestinal: Negative for constipation, diarrhea, nausea and vomiting. Skin: Negative for rash. Neurological: Negative for dizziness and headaches. Psychiatric/Behavioral: The patient is not nervous/anxious.  All other systems reviewed and are negative.MEDICAL HISTORY:Past Medical History: Diagnosis Date ? Arthritis 02/09/2018 ? Bulging lumbar disc 02/09/2018 ? Deep vein thrombosis (DVT) of lower extremity (HC Code) 02/09/2018 ? Diabetes mellitus, type II (HC Code) 02/09/2018 ? ESRD (end stage renal disease) (HC Code) (HC CODE) (HC Code) 02/09/2018 ? Exertional dyspnea 02/09/2018 ? Fibroids 02/09/2018 ? History of thoracotomy 02/09/2018 ? HTN (hypertension) 02/09/2018 ? Hx of tonsillectomy 02/09/2018 ? Hyperlipidemia 02/09/2018 ? Myasthenia gravis (HC Code) 02/09/2018 ? Obesity 02/09/2018 ? PVD (peripheral vascular disease) (HC Code) (HC CODE) (HC Code) 03/26/2018 ? S/P TAH-BSO 02/09/2018 ? 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 azaTHIOprine (IMURAN) 50 mg tablet Take four 50 mg tablets (200 mg total) once daily.   ? BD ULTRA-FINE MINI PEN NEEDLE 31 gauge x 3/16 Ndle USE 1 PEN NEEDLE ONCE DAILY   ? carvediloL (COREG) 25 mg Immediate Release tablet TAKE 1 TABLET BY MOUTH TWICE DAILY WITH MEALS   ? cyanocobalamin, vitamin B-12, 2,500 mcg Subl DISSOLVE 1 TABLET ONCE DAILY IN THE MORNING FOR 90 DAYS   ? doxazosin (CARDURA) 4 mg tablet TAKE 1 TABLET BY MOUTH ONCE DAILY AT NIGHT   ? ELIQUIS 2.5 mg Tab tablet TAKE 1 TABLET BY MOUTH TWICE DAILY   ? furosemide (LASIX) 40 mg tablet furosemide 40 mg tablet 1 tablet twice a day   ? insulin degludec (TRESIBA FLEXTOUCH U-200 INSULIN) 200 unit/mL (3 mL) pen INJECT 4 UNITS SUBCUTANEOUSLY EVERY DAY. DISCARD PEN 56 DAYS AFTER OPENING   ? loperamide (IMODIUM) 2 mg capsule Take 2 mg by mouth.    ? olmesartan (BENICAR) 40 MG tablet Take 40 mg by mouth daily.   ? pravastatin (PRAVACHOL) 80 mg tablet TAKE 1 TABLET BY MOUTH ONCE DAILY   ? predniSONE (DELTASONE) 1 mg tablet daily.   ?  sertraline (ZOLOFT) 50 mg tablet TAKE 1 TABLET BY MOUTH ONCE DAILY   No current facility-administered medications for this visit. SURGICAL HISTORY: No past surgical history on file.SOCIAL HISTORY:Social History Socioeconomic History ? Marital status: Single Tobacco Use ? Smoking status: Never Smoker Substance and Sexual Activity ? Alcohol use: Not Currently FAMILY HISTORY:Family History Problem Relation Age of Onset ? Breast cancer Mother  ? Stomach cancer Brother  ? Cervical cancer Paternal Grandmother  PHYSICAL EXAM: BP (!) 168/70 (Site: l a, Position: Sitting, Cuff Size: Medium)  - Pulse (!) 93  - Temp 97.5 ?F (36.4 ?C) (Temporal)  - Resp 20  - Wt 59.3 kg  - SpO2 96%  - BMI 24.70 kg/m?  GEN: appears well, NAD, looks fatiguedHEENT: NC/AT, PERRL, OP clear, MMMNeck: No cervical LADPULM: CTABCV: RRR, no m/r/gGI: Soft, NTNDEXT: Minimal RLE edemaNeuro: AAOx4DATA REVIEW:  05/03/2021 ANC 0.9Lab Results Component Value Date  WBC 1.7 (L) 05/03/2021  HGB 10.3 (L) 05/03/2021  HCT 31.00 (L) 05/03/2021  MCV 97.5 05/03/2021  PLT 119 (L) 05/03/2021   Chemistry    Component Value Date/Time  NA 139 05/03/2021 1407  K 4.3 05/03/2021 1407  CL 103 05/03/2021 1407  CO2 24 05/03/2021 1407  BUN 28 (H) 05/03/2021 1407  CREATININE 6.53 (HH) 05/03/2021 1407  GLU 179 (H) 05/03/2021 1407  PROT 6.1 (L) 05/03/2021 1407    Component Value Date/Time  CALCIUM 9.3 05/03/2021 1407  ALKPHOS 66 05/03/2021 1407  AST 17 05/03/2021 1407  ALT 16 05/03/2021 1407  BILITOT 0.8 05/03/2021 1407  ALBUMIN 3.7 05/03/2021 1407  Lab Results Component Value Date  IRON 50 04/05/2021  TIBC  04/05/2021    Comment:    Result not valid; Ferritin is >1200 ng/mL.  FERRITIN 4,020 (H) 04/05/2021 Lab Results Component Value Date  RETICCTPCT 1.7 05/03/2021 Lab Results Component Value Date  VITAMINB12 1,778 (H) 02/15/2021 U/S: 8/11/22COMPARISON: Correlation is made with prior study performed 04/11/2020. Ultrasound exam of the right lower extremity deep venous system from the groin to the popliteal fossa with high resolution B-Mode imaging, color flow Doppler imaging and Doppler spectral analysis without and with transducer venous compression and with venous flow augmentation. A duplicated right femoral vein is present. The right common femoral vein demonstrates normal compressibility, normal patency and unidirectional color flow, normal flow with respiratory phasicity and normal flow augmentation responses. A duplicated femoral vein is present. Grayscale interrogation reveals no compression of a femoral vein. Color Doppler shows no flow through this vessel. The findings are compatible with occlusion of one of the femoral veins. Popliteal vein shows incomplete compression and color flow compatible with nonocclusive thrombus. Color Doppler shows incomplete flow in the trifurcation extending to the posterior tibial and peroneal?veins. ?There is no thrombosis or occlusion. ?There is no popliteal cyst. 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 There is nonocclusive thrombus within the popliteal vein as well as the peroneal and posterior tibial veins in the calf. IMPRESSION & PLAN:Yamari Boyack is a 70 y.o. 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  She continues to receive ESA through the dialysis unit.  No further intervention on our part.Iron infusions have been discontinued per Dr. Kennedy Bucker given ferritin level of 4000. Neutropenia-unclear etiology.  No complicating infections.  ANC is lower than last week.  Imuran may be contributing.  Was able to discuss holding Imuran for 2 weeks with neurology.Thrombocytopenia-platelet count in the 60s end  of October.  119 today.  Patient denies starting any new medications in the last 2 months.DVTs:  Treated with Coumadin- INR titration in dialysis facility.  No bleeding events or recurrent thrombosis.  IPMN-followed by Dr. Suzan Slick, stable Plan:Return 1 week for follow-up blood countsHold ImuranCheck B12, folate, iron studiesI have spent 50 minutes in direct contact with patient, of which 45 minutes was spent on counseling and care coordination

## 2021-05-04 ENCOUNTER — Encounter: Admit: 2021-05-04 | Payer: PRIVATE HEALTH INSURANCE | Attending: Medical Oncology

## 2021-05-04 ENCOUNTER — Ambulatory Visit: Admit: 2021-05-04 | Payer: BLUE CROSS/BLUE SHIELD | Attending: Medical Oncology

## 2021-05-04 DIAGNOSIS — N186 End stage renal disease: Secondary | ICD-10-CM

## 2021-05-04 DIAGNOSIS — D631 Anemia in chronic kidney disease: Secondary | ICD-10-CM

## 2021-05-04 DIAGNOSIS — Z992 Dependence on renal dialysis: Secondary | ICD-10-CM

## 2021-05-04 LAB — IRON AND TIBC: BKR IRON: 77 ug/dL (ref 37–145)

## 2021-05-04 LAB — FERRITIN: BKR FERRITIN: 2739 ng/mL — ABNORMAL HIGH (ref 15–150)

## 2021-05-04 LAB — FOLATE: BKR FOLATE: 13.2 ng/mL

## 2021-05-04 LAB — BLOOD SMEAR MD INTERPRETATION     (YH)

## 2021-05-04 LAB — HAPTOGLOBIN: BKR HAPTOGLOBIN: 13 mg/dL — ABNORMAL LOW (ref 30–200)

## 2021-05-04 LAB — ANA BY IFA W/RFLX TO DSDNA, RNP, SM, SSA, AND SSB ABS     (YH): BKR ANTI-NUCLEAR ANTIBODY (ANA): <1:80 {titer}

## 2021-05-04 LAB — DSDNA ANTIBODY, IGG WITH IFA CONFIRMATION     (BH LMW YH): BKR DSDNA AB: 1.1 IU/mL (ref <10.0)

## 2021-05-04 LAB — C-REACTIVE PROTEIN     (CRP): BKR C-REACTIVE PROTEIN, HIGH SENSITIVITY: 8 mg/L — ABNORMAL HIGH

## 2021-05-04 LAB — VITAMIN B12: BKR VITAMIN B12: 1168 pg/mL (ref 232–1245)

## 2021-05-10 ENCOUNTER — Ambulatory Visit
Admit: 2021-05-10 | Payer: BLUE CROSS/BLUE SHIELD | Attending: Student in an Organized Health Care Education/Training Program

## 2021-05-10 ENCOUNTER — Inpatient Hospital Stay: Admit: 2021-05-10 | Discharge: 2021-05-10 | Payer: BLUE CROSS/BLUE SHIELD

## 2021-05-10 ENCOUNTER — Ambulatory Visit: Admit: 2021-05-10 | Payer: BLUE CROSS/BLUE SHIELD | Attending: Medical Oncology

## 2021-05-10 ENCOUNTER — Encounter: Admit: 2021-05-10 | Payer: PRIVATE HEALTH INSURANCE | Attending: Medical Oncology

## 2021-05-10 ENCOUNTER — Ambulatory Visit: Admit: 2021-05-10 | Payer: BLUE CROSS/BLUE SHIELD

## 2021-05-10 DIAGNOSIS — D61818 Other pancytopenia: Secondary | ICD-10-CM

## 2021-05-10 DIAGNOSIS — E785 Hyperlipidemia, unspecified: Secondary | ICD-10-CM

## 2021-05-10 DIAGNOSIS — R0609 Other forms of dyspnea: Secondary | ICD-10-CM

## 2021-05-10 DIAGNOSIS — I10 Essential (primary) hypertension: Secondary | ICD-10-CM

## 2021-05-10 DIAGNOSIS — Z9889 Other specified postprocedural states: Secondary | ICD-10-CM

## 2021-05-10 DIAGNOSIS — D219 Benign neoplasm of connective and other soft tissue, unspecified: Secondary | ICD-10-CM

## 2021-05-10 DIAGNOSIS — N185 Chronic kidney disease, stage 5: Secondary | ICD-10-CM

## 2021-05-10 DIAGNOSIS — N186 End stage renal disease: Secondary | ICD-10-CM

## 2021-05-10 DIAGNOSIS — M5136 Other intervertebral disc degeneration, lumbar region: Secondary | ICD-10-CM

## 2021-05-10 DIAGNOSIS — Z90710 Acquired absence of both cervix and uterus: Secondary | ICD-10-CM

## 2021-05-10 DIAGNOSIS — E669 Obesity, unspecified: Secondary | ICD-10-CM

## 2021-05-10 DIAGNOSIS — Z9089 Acquired absence of other organs: Secondary | ICD-10-CM

## 2021-05-10 DIAGNOSIS — I739 Peripheral vascular disease, unspecified: Secondary | ICD-10-CM

## 2021-05-10 DIAGNOSIS — G7000 Myasthenia gravis without (acute) exacerbation: Secondary | ICD-10-CM

## 2021-05-10 DIAGNOSIS — E119 Type 2 diabetes mellitus without complications: Secondary | ICD-10-CM

## 2021-05-10 DIAGNOSIS — M1990 Unspecified osteoarthritis, unspecified site: Secondary | ICD-10-CM

## 2021-05-10 DIAGNOSIS — D631 Anemia in chronic kidney disease: Secondary | ICD-10-CM

## 2021-05-10 DIAGNOSIS — I82409 Acute embolism and thrombosis of unspecified deep veins of unspecified lower extremity: Secondary | ICD-10-CM

## 2021-05-10 DIAGNOSIS — G5700 Lesion of sciatic nerve, unspecified lower limb: Secondary | ICD-10-CM

## 2021-05-10 LAB — CBC WITH AUTO DIFFERENTIAL
BKR WAM ABSOLUTE IMMATURE GRANULOCYTES.: 0.05 x 1000/??L (ref 0.00–0.30)
BKR WAM ABSOLUTE LYMPHOCYTE COUNT.: 0.45 x 1000/??L — ABNORMAL LOW (ref 0.60–3.70)
BKR WAM ABSOLUTE NRBC (2 DEC): 0 x 1000/??L (ref 0.00–1.00)
BKR WAM ANALYZER ANC: 2.37 x 1000/??L (ref 2.00–7.60)
BKR WAM BASOPHIL ABSOLUTE COUNT.: 0.03 x 1000/??L (ref 0.00–1.00)
BKR WAM BASOPHILS: 0.9 % (ref 0.0–1.4)
BKR WAM EOSINOPHIL ABSOLUTE COUNT.: 0.05 x 1000/??L (ref 0.00–1.00)
BKR WAM EOSINOPHILS: 1.6 % (ref 0.0–5.0)
BKR WAM HEMATOCRIT (2 DEC): 30.1 % — ABNORMAL LOW (ref 35.00–45.00)
BKR WAM HEMOGLOBIN: 9.8 g/dL — ABNORMAL LOW (ref 11.7–15.5)
BKR WAM IMMATURE GRANULOCYTES: 1.6 % — ABNORMAL HIGH (ref 0.0–1.0)
BKR WAM LYMPHOCYTES: 14 % — ABNORMAL LOW (ref 17.0–50.0)
BKR WAM MCH (PG): 31.9 pg (ref 27.0–33.0)
BKR WAM MCHC: 32.6 g/dL (ref 31.0–36.0)
BKR WAM MCV: 98 fL (ref 80.0–100.0)
BKR WAM MONOCYTE ABSOLUTE COUNT.: 0.26 x 1000/??L (ref 0.00–1.00)
BKR WAM MONOCYTES: 8.1 % (ref 4.0–12.0)
BKR WAM MPV: 13.2 fL — ABNORMAL HIGH (ref 8.0–12.0)
BKR WAM NEUTROPHILS: 73.8 % — ABNORMAL HIGH (ref 39.0–72.0)
BKR WAM NUCLEATED RED BLOOD CELLS: 0 % (ref 0.0–1.0)
BKR WAM PLATELETS: 108 x1000/??L — ABNORMAL LOW (ref 150–420)
BKR WAM RDW-CV: 22.4 % — ABNORMAL HIGH (ref 11.0–15.0)
BKR WAM RED BLOOD CELL COUNT.: 3.07 M/??L — ABNORMAL LOW (ref 4.00–6.00)
BKR WAM WHITE BLOOD CELL COUNT: 3.2 x1000/??L — ABNORMAL LOW (ref 4.0–11.0)

## 2021-05-10 LAB — IRON AND TIBC: BKR IRON: 66 ug/dL (ref 37–145)

## 2021-05-10 LAB — FERRITIN: BKR FERRITIN: 2531 ng/mL — ABNORMAL HIGH (ref 15–150)

## 2021-05-10 NOTE — Progress Notes
RE: Lisa [name]: 1952/09/06MRN: [id number] PROVIDER: Manuela Schwartz, APRNDATE: 11/17/2022REASON FOR VISIT:Follow-up visitREFERRING PROVIDER: No ref. provider foundDIAGNOSIS:Encounter Diagnoses Name Primary? ? Anemia of chronic kidney failure, stage 5 (HC Code) Yes ? 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 Pt reports that no biopsy has been done due to the location and possibly complications; GI team is monitoring.HEMATOLOGY HISTORY:Anemia of ESRD on dialysisCURRENT TREATMENT:IV iron and ESA with dialysis.Last blood transfusion x 2 units 10/2022HISTORY OF PRESENT ILLNESS: Lisa [name] is a 70 y.o. female who is presenting for a follow-up of anemia of CKD and new pancytopenia.  She is currently on dialysis receiving ESA.  Imuran was held last week as possible culprit for pancytopenia.She generally feeling well.  She denies any significant bleeding or bruising.  Stools vacillate between constipation and diarrhea.  She denies any abdominal pain or distension.  She denies recent fever, chills, sweats.  Myasthenia gravis was being treated with Imuran by her neurologist Dr. Marlyne Beards.  She denies any new medical problems in the interim since the last visit 1 week ago.REVIEW OF SYSTEMS:Review of Systems Constitutional: Negative for appetite change, chills, fatigue and fever. HENT: Negative for facial swelling and trouble swallowing.  Eyes: Negative for pain. Respiratory: Negative for shortness of breath.  Cardiovascular: Negative for leg swelling. Gastrointestinal: Negative for constipation, diarrhea, nausea and vomiting. Skin: Negative for rash. Neurological: Negative for dizziness and headaches. Psychiatric/Behavioral: The patient is not nervous/anxious.  All other systems reviewed and are negative.MEDICAL HISTORY:Past Medical History: Diagnosis Date ? Arthritis 02/09/2018 ? Bulging lumbar disc 02/09/2018 ? Deep vein thrombosis (DVT) of lower extremity (HC Code) 02/09/2018 ? Diabetes mellitus, type II (HC Code) 02/09/2018 ? ESRD (end stage renal disease) (HC Code) (HC CODE) (HC Code) 02/09/2018 ? Exertional dyspnea 02/09/2018 ? Fibroids 02/09/2018 ? History of thoracotomy 02/09/2018 ? HTN (hypertension) 02/09/2018 ? Hx of tonsillectomy 02/09/2018 ? Hyperlipidemia 02/09/2018 ? Myasthenia gravis (HC Code) 02/09/2018 ? Obesity 02/09/2018 ? PVD (peripheral vascular disease) (HC Code) (HC CODE) (HC Code) 03/26/2018 ? S/P TAH-BSO 02/09/2018 ? 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 azaTHIOprine (IMURAN) 50 mg tablet Take four 50 mg tablets (200 mg total) once daily.   ? BD ULTRA-FINE MINI PEN NEEDLE 31 gauge x 3/16 Ndle USE 1 PEN NEEDLE ONCE DAILY   ? carvediloL (COREG) 25 mg Immediate Release tablet TAKE 1 TABLET BY MOUTH TWICE DAILY WITH MEALS   ? cyanocobalamin, vitamin B-12, 2,500 mcg Subl DISSOLVE 1 TABLET ONCE DAILY IN THE MORNING FOR 90 DAYS   ? doxazosin (CARDURA) 4 mg tablet TAKE 1 TABLET BY MOUTH ONCE DAILY AT NIGHT   ? ELIQUIS 2.5 mg Tab tablet TAKE 1 TABLET BY MOUTH TWICE DAILY   ? furosemide (LASIX) 40 mg tablet furosemide 40 mg tablet 1 tablet twice a day   ? insulin degludec (TRESIBA FLEXTOUCH U-200 INSULIN) 200 unit/mL (3 mL) pen INJECT 4 UNITS SUBCUTANEOUSLY EVERY DAY. DISCARD PEN 56 DAYS AFTER OPENING   ? loperamide (IMODIUM) 2 mg capsule Take 2 mg by mouth.    ? olmesartan (BENICAR) 40 MG tablet Take 40 mg by mouth daily.   ? pravastatin (PRAVACHOL) 80 mg tablet TAKE 1 TABLET BY MOUTH ONCE DAILY   ? predniSONE (DELTASONE) 1 mg tablet daily.   ? sertraline (ZOLOFT) 50 mg tablet TAKE 1 TABLET BY MOUTH ONCE DAILY   No current facility-administered medications for this visit.  SURGICAL HISTORY: No past surgical history on file.SOCIAL HISTORY:Social History Socioeconomic History ? Marital status: Single Tobacco Use ? Smoking status: Never Smoker Substance and Sexual Activity ? Alcohol use: Not Currently FAMILY HISTORY:Family History Problem Relation Age of Onset ? Breast cancer Mother  ? Stomach cancer Brother  ? Cervical cancer Paternal Grandmother  PHYSICAL EXAM: BP (!) 156/70 (Site: l a, Position: Sitting, Cuff Size: Medium)  - Pulse 82  - Temp 97.2 ?F (36.2 ?C) (Temporal)  - Resp 20  - Wt 59.9 kg  - SpO2 99%  - BMI 24.95 kg/m?  GEN: appears well, NAD, looks fatiguedHEENT: NC/AT, PERRL, OP clear, MMMNeck: No cervical LADPULM: CTABCV: RRR, no m/r/gGI: Soft, NTNDEXT: Minimal RLE edemaNeuro: AAOx4DATA REVIEW:  05/10/2021 ANC 2.3Lab Results Component Value Date  WBC 3.2 (L) 05/10/2021  HGB 9.8 (L) 05/10/2021  HCT 30.10 (L) 05/10/2021  MCV 98.0 05/10/2021  PLT 108 (L) 05/10/2021   Chemistry    Component Value Date/Time  NA 139 05/03/2021 1407  K 4.3 05/03/2021 1407  CL 103 05/03/2021 1407  CO2 24 05/03/2021 1407  BUN 28 (H) 05/03/2021 1407  CREATININE 6.53 (HH) 05/03/2021 1407  GLU 179 (H) 05/03/2021 1407  PROT 6.1 (L) 05/03/2021 1407    Component Value Date/Time  CALCIUM 9.3 05/03/2021 1407  ALKPHOS 66 05/03/2021 1407  AST 17 05/03/2021 1407  ALT 16 05/03/2021 1407  BILITOT 0.8 05/03/2021 1407  ALBUMIN 3.7 05/03/2021 1407  Lab Results Component Value Date  IRON 66 05/10/2021  TIBC  05/10/2021    Comment:    Result not valid; Ferritin is >1200 ng/mL.  FERRITIN 2,531 (H) 05/10/2021 Lab Results Component Value Date  RETICCTPCT 1.7 05/03/2021 Lab Results Component Value Date  VITAMINB12 1,168 05/04/2021 U/S: 8/11/22COMPARISON: Correlation is made with prior study performed 04/11/2020. Ultrasound exam of the right lower extremity deep venous system from the groin to the popliteal fossa with high resolution B-Mode imaging, color flow Doppler imaging and Doppler spectral analysis without and with transducer venous compression and with venous flow augmentation. A duplicated right femoral vein is present. The right common femoral vein demonstrates normal compressibility, normal patency and unidirectional color flow, normal flow with respiratory phasicity and normal flow augmentation responses. A duplicated femoral vein is present. Grayscale interrogation reveals no compression of a femoral vein. Color Doppler shows no flow through this vessel. The findings are compatible with occlusion of one of the femoral veins. Popliteal vein shows incomplete compression and color flow compatible with nonocclusive thrombus. Color Doppler shows incomplete flow in the trifurcation extending to the posterior tibial and peroneal?veins. ?There is no thrombosis or occlusion. ?There is no popliteal cyst. 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 There is nonocclusive thrombus within the popliteal vein as well as the peroneal and posterior tibial veins in the calf. IMPRESSION & PLAN:Victorya Kopecky is a 70 y.o. 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  She continues to receive ESA through the dialysis unit.  No further intervention on our part.  B12/folate/iron deficiency ruled outIron infusions have been discontinued per Dr. Kennedy Bucker given ferritin level of 4000. Neutropenia-felt secondary to Imuran.  Normal ANC since discontinuation one-week ago.  No complicating infections.  Will follow-up with Neurology for treatment of myasthenia gravisThrombocytopenia-platelet count in the 60s end of October.  Stable at 108 today.  May also be associated with Imuran and needs more recovery time.  DVTs:  Treated  with Coumadin- INR titration in dialysis facility.  No bleeding events or recurrent thrombosis.  IPMN-followed by Dr. Suzan Slick, stable Plan:Return in 2 weeks for follow-up evaluation of blood counts

## 2021-05-22 ENCOUNTER — Inpatient Hospital Stay: Admit: 2021-05-22 | Discharge: 2021-05-22 | Payer: BLUE CROSS/BLUE SHIELD

## 2021-05-23 LAB — FECAL IMMUNOCHEMICAL ASSAY (FIT)   (BH GH LMW YH): BKR FECAL IMMUNOCHEMICAL ASSAY (FIT): NEGATIVE

## 2021-05-24 ENCOUNTER — Ambulatory Visit: Admit: 2021-05-24 | Payer: BLUE CROSS/BLUE SHIELD

## 2021-05-24 ENCOUNTER — Telehealth: Admit: 2021-05-24 | Payer: PRIVATE HEALTH INSURANCE | Attending: Medical Oncology

## 2021-05-24 ENCOUNTER — Ambulatory Visit: Admit: 2021-05-24 | Payer: BLUE CROSS/BLUE SHIELD | Attending: Medical Oncology

## 2021-05-24 NOTE — Telephone Encounter
ZOX:WRUEAVW called in regards to their upcoming appointment today at 11. Patient noted they won't be able to make it since they are currently at the ED. Patient would like to reschedule this appointment. Patient can be reached at [phone]

## 2021-06-07 ENCOUNTER — Encounter
Admit: 2021-06-07 | Payer: PRIVATE HEALTH INSURANCE | Attending: Student in an Organized Health Care Education/Training Program

## 2021-06-07 ENCOUNTER — Telehealth
Admit: 2021-06-07 | Payer: PRIVATE HEALTH INSURANCE | Attending: Student in an Organized Health Care Education/Training Program

## 2021-06-07 ENCOUNTER — Ambulatory Visit
Admit: 2021-06-07 | Payer: BLUE CROSS/BLUE SHIELD | Attending: Student in an Organized Health Care Education/Training Program

## 2021-06-07 ENCOUNTER — Inpatient Hospital Stay: Admit: 2021-06-07 | Discharge: 2021-06-07 | Payer: BLUE CROSS/BLUE SHIELD

## 2021-06-07 DIAGNOSIS — N185 Chronic kidney disease, stage 5: Secondary | ICD-10-CM

## 2021-06-07 DIAGNOSIS — D61818 Other pancytopenia: Secondary | ICD-10-CM

## 2021-06-07 DIAGNOSIS — Z9089 Acquired absence of other organs: Secondary | ICD-10-CM

## 2021-06-07 DIAGNOSIS — M1990 Unspecified osteoarthritis, unspecified site: Secondary | ICD-10-CM

## 2021-06-07 DIAGNOSIS — D631 Anemia in chronic kidney disease: Secondary | ICD-10-CM

## 2021-06-07 DIAGNOSIS — M5136 Other intervertebral disc degeneration, lumbar region: Secondary | ICD-10-CM

## 2021-06-07 DIAGNOSIS — G7000 Myasthenia gravis without (acute) exacerbation: Secondary | ICD-10-CM

## 2021-06-07 DIAGNOSIS — D219 Benign neoplasm of connective and other soft tissue, unspecified: Secondary | ICD-10-CM

## 2021-06-07 DIAGNOSIS — I10 Essential (primary) hypertension: Secondary | ICD-10-CM

## 2021-06-07 DIAGNOSIS — Z90710 Acquired absence of both cervix and uterus: Secondary | ICD-10-CM

## 2021-06-07 DIAGNOSIS — E669 Obesity, unspecified: Secondary | ICD-10-CM

## 2021-06-07 DIAGNOSIS — Z9889 Other specified postprocedural states: Secondary | ICD-10-CM

## 2021-06-07 DIAGNOSIS — E785 Hyperlipidemia, unspecified: Secondary | ICD-10-CM

## 2021-06-07 DIAGNOSIS — N186 End stage renal disease: Secondary | ICD-10-CM

## 2021-06-07 DIAGNOSIS — G5700 Lesion of sciatic nerve, unspecified lower limb: Secondary | ICD-10-CM

## 2021-06-07 DIAGNOSIS — I82409 Acute embolism and thrombosis of unspecified deep veins of unspecified lower extremity: Secondary | ICD-10-CM

## 2021-06-07 DIAGNOSIS — I739 Peripheral vascular disease, unspecified: Secondary | ICD-10-CM

## 2021-06-07 DIAGNOSIS — R0609 Other forms of dyspnea: Secondary | ICD-10-CM

## 2021-06-07 DIAGNOSIS — E119 Type 2 diabetes mellitus without complications: Secondary | ICD-10-CM

## 2021-06-07 LAB — CBC WITH AUTO DIFFERENTIAL
BKR CHLORIDE: 28.1 % — ABNORMAL LOW (ref 35.00–45.00)
BKR WAM ABSOLUTE IMMATURE GRANULOCYTES.: 0.03 x 1000/??L (ref 0.00–0.30)
BKR WAM ABSOLUTE LYMPHOCYTE COUNT.: 1.72 x 1000/??L (ref 0.60–3.70)
BKR WAM ABSOLUTE NRBC (2 DEC): 0 x 1000/??L (ref 0.00–1.00)
BKR WAM ANALYZER ANC: 4.47 x 1000/??L (ref 2.00–7.60)
BKR WAM BASOPHIL ABSOLUTE COUNT.: 0.04 x 1000/??L (ref 0.00–1.00)
BKR WAM BASOPHILS: 0.6 % (ref 0.0–1.4)
BKR WAM EOSINOPHIL ABSOLUTE COUNT.: 0.19 x 1000/??L — ABNORMAL LOW (ref 0.00–1.00)
BKR WAM EOSINOPHILS: 2.6 % (ref 0.0–5.0)
BKR WAM HEMATOCRIT (2 DEC): 28.1 % — ABNORMAL LOW (ref 35.00–45.00)
BKR WAM HEMOGLOBIN: 8.6 g/dL — ABNORMAL LOW (ref 11.7–15.5)
BKR WAM IMMATURE GRANULOCYTES: 0.4 % (ref 0.0–1.0)
BKR WAM LYMPHOCYTES: 23.9 % — ABNORMAL LOW (ref 17.0–50.0)
BKR WAM MCH (PG): 32.3 pg (ref 27.0–33.0)
BKR WAM MCHC: 30.6 g/dL — ABNORMAL LOW (ref 31.0–36.0)
BKR WAM MCV: 105.6 fL — ABNORMAL HIGH (ref 80.0–100.0)
BKR WAM MONOCYTE ABSOLUTE COUNT.: 0.76 x 1000/ÂµL (ref 0.00–1.00)
BKR WAM MONOCYTES: 10.5 % (ref 4.0–12.0)
BKR WAM MPV: 12 fL (ref 8.0–12.0)
BKR WAM NEUTROPHILS: 62 % (ref 39.0–72.0)
BKR WAM NUCLEATED RED BLOOD CELLS: 0 % (ref 0.0–1.0)
BKR WAM PLATELETS: 103 x1000/ÂµL — ABNORMAL LOW (ref 150–420)
BKR WAM RDW-CV: 21.5 % — ABNORMAL HIGH (ref 11.0–15.0)
BKR WAM RED BLOOD CELL COUNT.: 2.66 M/ÂµL — ABNORMAL LOW (ref 4.00–6.00)
BKR WAM WHITE BLOOD CELL COUNT: 7.2 x1000/??L (ref 4.0–11.0)

## 2021-06-07 LAB — COMPREHENSIVE METABOLIC PANEL
BKR A/G RATIO: 1.3 (ref 1.0–2.2)
BKR ALANINE AMINOTRANSFERASE (ALT): 34 U/L (ref 10–35)
BKR ALBUMIN: 3.4 g/dL — ABNORMAL LOW (ref 3.6–4.9)
BKR ALKALINE PHOSPHATASE: 94 U/L (ref 9–122)
BKR ANION GAP: 11 (ref 7–17)
BKR ASPARTATE AMINOTRANSFERASE (AST): 25 U/L (ref 10–35)
BKR AST/ALT RATIO: 0.7 x 1000/??L (ref 0.00–1.00)
BKR BILIRUBIN TOTAL: 0.4 mg/dL (ref 0.0–<=1.2)
BKR BLOOD UREA NITROGEN: 24 mg/dL — ABNORMAL HIGH (ref 8–23)
BKR BUN / CREAT RATIO: 5.4 % — ABNORMAL LOW (ref 8.0–23.0)
BKR CALCIUM: 9.6 mg/dL (ref 8.8–10.2)
BKR CO2: 27 mmol/L — ABNORMAL HIGH (ref 20–30)
BKR CREATININE: 4.48 mg/dL — ABNORMAL HIGH (ref 0.40–1.30)
BKR EGFR, CREATININE (CKD-EPI 2021): 10 mL/min/{1.73_m2} — ABNORMAL LOW (ref >=60)
BKR GLOBULIN: 2.6 g/dL (ref 2.3–3.5)
BKR GLUCOSE: 105 mg/dL — ABNORMAL HIGH (ref 70–100)
BKR POTASSIUM: 4.4 mmol/L (ref 3.3–5.1)
BKR PROTEIN TOTAL: 6 g/dL — ABNORMAL LOW (ref 6.6–8.7)
BKR SODIUM: 143 mmol/L (ref 136–144)

## 2021-06-07 LAB — RETICULOCYTES
BKR WAM IRF: 25.4 % — ABNORMAL HIGH (ref 3.0–15.9)
BKR WAM RETICULOCYTE - ABS (3 DEC): 0.088 10??6 cells/uL (ref 0.023–0.140)
BKR WAM RETICULOCYTE COUNT PCT (1 DEC): 3.3 % — ABNORMAL HIGH (ref 0.6–2.7)
BKR WAM RETICULOCYTE HGB EQUIVALENT: 32.1 pg (ref 28.2–35.7)

## 2021-06-07 MED ORDER — WARFARIN 5 MG TABLET
5 mg | Status: AC
Start: 2021-06-07 — End: ?

## 2021-06-07 MED ORDER — ENTRESTO 24 MG-26 MG TABLET
24-26 mg | Status: AC
Start: 2021-06-07 — End: ?

## 2021-06-07 NOTE — Telephone Encounter
lft message on voice mail to call and make appointment.

## 2021-06-07 NOTE — Telephone Encounter
Patient called in regards to their upcoming appointments that have been scheduled. Patient noted that on 12/19 and 1/12 they need later in the day like early to mid afternoon Patient also stated that the appointment on 1/23 needs to be moved because MWF they do dialysis and it is an all day thing. They need a Tuesday or Thursday. Patient can be reached at [phone]

## 2021-06-07 NOTE — Telephone Encounter
Pt just spoke with DR.Grant 06/07/21 & he told her he wants her to have bloodwork done everyother Thursday and she is to make an appt with Dr Kennedy Bucker and to have more  bloodwork in 1 month.Lousie can be reached at [phone]

## 2021-06-08 NOTE — Progress Notes
RE: Lisa [name]: 08-22-1952MRN: [id number] PROVIDER: Ishmael Holter, MDDATE: 12/15/2022REASON FOR VISIT:Chief Complaint Patient presents with ? Follow-up Visit DIAGNOSIS:Encounter Diagnoses Name Primary? ? Pancytopenia (HC Code) Yes ? Anemia of chronic kidney failure, stage 5 (HC Code)  HEMATOLOGY HISTORY:Anemia of ESRD on dialysisCURRENT TREATMENT:IV iron and ESA with dialysis.Last blood transfusion x 2 units in 8/2022ECOG: 0HISTORY OF PRESENT ILLNESS: Lisa [name] is a 70 y.o. female who is presenting for a follow-up of anemia of CKD.Patient today reports she is okay and hanging in there.Patient reports being tapered off her prednisone. She reports having been on prednisone since she was in her 17s.She reports having no new issues with her myasthenia gravis.She reports having increased fatigue levels.She is now on Entresto 24 mg/26 mg. She reports having difficulty catching her breath. She is still on warfarin.Patient reports her BP is fluctuating. She says her BP on Monday was 200; last week was average about 150-160 sytolic.Patient last needed a blood transfusion in November 2022.REVIEW OF SYSTEMS:Review of Systems Constitutional: Negative for appetite change, chills, fatigue and fever. HENT: Negative for facial swelling and trouble swallowing.  Eyes: Negative for pain. Respiratory: Negative for shortness of breath.  Cardiovascular: Negative for leg swelling. Gastrointestinal: Negative for constipation, diarrhea, nausea and vomiting. Skin: Negative for rash. Neurological: Negative for dizziness and headaches. Psychiatric/Behavioral: The patient is not nervous/anxious.  All other systems reviewed and are negative.MEDICAL HISTORY:Past Medical History: Diagnosis Date ? Arthritis 02/09/2018 ? Bulging lumbar disc 02/09/2018 ? Deep vein thrombosis (DVT) of lower extremity (HC Code) 02/09/2018 ? Diabetes mellitus, type II (HC Code) 02/09/2018 ? ESRD (end stage renal disease) (HC Code) (HC CODE) (HC Code) 02/09/2018 ? Exertional dyspnea 02/09/2018 ? Fibroids 02/09/2018 ? History of thoracotomy 02/09/2018 ? HTN (hypertension) 02/09/2018 ? Hx of tonsillectomy 02/09/2018 ? Hyperlipidemia 02/09/2018 ? Myasthenia gravis (HC Code) 02/09/2018 ? Obesity 02/09/2018 ? PVD (peripheral vascular disease) (HC Code) (HC CODE) (HC Code) 03/26/2018 ? S/P TAH-BSO 02/09/2018 ? 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 sacubitriL-valsartan (ENTRESTO) 24-26 mg tablet Entresto 24 mg-26 mg tablet Take 1 tablet by mouth twice daily   ? warfarin (COUMADIN) 5 mg tablet every 24 hours.   ? BD ULTRA-FINE MINI PEN NEEDLE 31 gauge x 3/16 Ndle USE 1 PEN NEEDLE ONCE DAILY   ? carvediloL (COREG) 25 mg Immediate Release tablet TAKE 1 TABLET BY MOUTH TWICE DAILY WITH MEALS   ? cyanocobalamin, vitamin B-12, 2,500 mcg Subl DISSOLVE 1 TABLET ONCE DAILY IN THE MORNING FOR 90 DAYS   ? doxazosin (CARDURA) 4 mg tablet TAKE 1 TABLET BY MOUTH ONCE DAILY AT NIGHT   ? furosemide (LASIX) 40 mg tablet furosemide 40 mg tablet 1 tablet twice a day   ? insulin degludec (TRESIBA FLEXTOUCH U-200 INSULIN) 200 unit/mL (3 mL) pen INJECT 4 UNITS SUBCUTANEOUSLY EVERY DAY. DISCARD PEN 56 DAYS AFTER OPENING   ? loperamide (IMODIUM) 2 mg capsule Take 2 mg by mouth.    ? olmesartan (BENICAR) 40 MG tablet Take 40 mg by mouth daily.   ? pravastatin (PRAVACHOL) 80 mg tablet TAKE 1 TABLET BY MOUTH ONCE DAILY   ? predniSONE (DELTASONE) 1 mg tablet daily.   ? sertraline (ZOLOFT) 50 mg tablet TAKE 1 TABLET BY MOUTH ONCE DAILY   No current facility-administered medications for this visit. SURGICAL HISTORY: No past surgical history on file.SOCIAL HISTORY:Social History Socioeconomic History ? Marital status: Single Tobacco Use ? Smoking status: Never Substance and Sexual Activity ? Alcohol use: Not Currently FAMILY HISTORY:Family  History Problem Relation Age of Onset ? Breast cancer Mother  ? Stomach cancer Brother  ? Cervical cancer Paternal Grandmother  PHYSICAL EXAM: Done on telehealth so no PE completed.DATA REVIEW:Lab Results Component Value Date  WBC 7.2 06/07/2021  HGB 8.6 (L) 06/07/2021  HCT 28.10 (L) 06/07/2021  MCV 105.6 (H) 06/07/2021  PLT 103 (L) 06/07/2021   Chemistry    Component Value Date/Time  NA 143 06/07/2021 1029  K 4.4 06/07/2021 1029  CL 105 06/07/2021 1029  CO2 27 06/07/2021 1029  BUN 24 (H) 06/07/2021 1029  CREATININE 4.48 (H) 06/07/2021 1029  GLU 105 (H) 06/07/2021 1029  PROT 6.0 (L) 06/07/2021 1029    Component Value Date/Time  CALCIUM 9.6 06/07/2021 1029  ALKPHOS 94 06/07/2021 1029  AST 25 06/07/2021 1029  ALT 34 06/07/2021 1029  BILITOT 0.4 06/07/2021 1029  ALBUMIN 3.4 (L) 06/07/2021 1029  Lab Results Component Value Date  IRON 66 05/10/2021  TIBC  05/10/2021    Comment:    Result not valid; Ferritin is >1200 ng/mL.  FERRITIN 2,531 (H) 05/10/2021 Lab Results Component Value Date  RETICCTPCT 3.3 (H) 06/07/2021 Lab Results Component Value Date  VITAMINB12 1,168 05/04/2021 IMPRESSION & PLAN:Lisa [name] is a 70 y.o. 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 Suzan Slick, stable in 8/2022Visit in 1 monthMichael Huston Foley, MDYale Cancer CenterAsst. Prof. Medicine, Fleischmann[facility] UniversitySmil[facility] the day of this patient's encounter, a total of 20 minutes was personally spent by me.  This does not include any resident/fellow teaching time, or any time spent performing a procedural service.Scribed for Ishmael Holter, MD by Genelle Gather, medical scribe June 07, 2021  The documentation recorded by the scribe accurately reflects the services I personally performed and the decisions made by me. I reviewed and confirmed all material entered and/or pre-charted by the scribe. VIDEO TELEHEALTH VISIT: This clinician is part of the telehealth program and is conducting this visit in a currently approved location. For this visit the clinician and patient were present via interactive audio & video telecommunications system that permits real-time communications, via the [employer]. Patient's use of the telehealth platform followed consent and acknowledges agreement to permit telehealth for this visit. State patient is located in: [place] clinician is appropriately licensed in the above state to provide care for this visit. Other individuals present during the telehealth encounter and their role/relation: none

## 2021-06-21 ENCOUNTER — Ambulatory Visit: Admit: 2021-06-21 | Payer: BLUE CROSS/BLUE SHIELD

## 2021-06-21 ENCOUNTER — Inpatient Hospital Stay: Admit: 2021-06-21 | Discharge: 2021-06-21 | Payer: BLUE CROSS/BLUE SHIELD

## 2021-06-21 DIAGNOSIS — D61818 Other pancytopenia: Secondary | ICD-10-CM

## 2021-06-21 LAB — CBC WITH AUTO DIFFERENTIAL
BKR WAM ABSOLUTE IMMATURE GRANULOCYTES.: 0.02 x 1000/??L (ref 0.00–0.30)
BKR WAM ABSOLUTE LYMPHOCYTE COUNT.: 1.47 x 1000/??L (ref 0.60–3.70)
BKR WAM ABSOLUTE NRBC (2 DEC): 0 x 1000/??L (ref 0.00–1.00)
BKR WAM ANALYZER ANC: 4.12 x 1000/??L (ref 2.00–7.60)
BKR WAM BASOPHIL ABSOLUTE COUNT.: 0.02 x 1000/??L (ref 0.00–1.00)
BKR WAM BASOPHILS: 0.3 % (ref 0.0–1.4)
BKR WAM EOSINOPHIL ABSOLUTE COUNT.: 0.14 x 1000/??L (ref 0.00–1.00)
BKR WAM EOSINOPHILS: 2.2 % (ref 0.0–5.0)
BKR WAM HEMATOCRIT (2 DEC): 30.3 % — ABNORMAL LOW (ref 35.00–45.00)
BKR WAM IMMATURE GRANULOCYTES: 0.3 % (ref 0.0–1.0)
BKR WAM LYMPHOCYTES: 23 % (ref 17.0–50.0)
BKR WAM MCH (PG): 33.3 pg — ABNORMAL HIGH (ref 27.0–33.0)
BKR WAM MCHC: 31 g/dL (ref 31.0–36.0)
BKR WAM MCV: 107.4 fL — ABNORMAL HIGH (ref 80.0–100.0)
BKR WAM MONOCYTE ABSOLUTE COUNT.: 0.62 x 1000/??L (ref 0.00–1.00)
BKR WAM MONOCYTES: 9.7 % (ref 4.0–12.0)
BKR WAM MPV: 12.4 fL — ABNORMAL HIGH (ref 8.0–12.0)
BKR WAM NEUTROPHILS: 64.5 % (ref 39.0–72.0)
BKR WAM NUCLEATED RED BLOOD CELLS: 0 % (ref 0.0–1.0)
BKR WAM PLATELETS: 129 x1000/??L — ABNORMAL LOW (ref 150–420)
BKR WAM RDW-CV: 18.5 % — ABNORMAL HIGH (ref 11.0–15.0)
BKR WAM RED BLOOD CELL COUNT.: 2.82 M/??L — ABNORMAL LOW (ref 4.00–6.00)
BKR WAM WHITE BLOOD CELL COUNT: 6.4 x1000/??L (ref 4.0–11.0)

## 2021-06-21 LAB — COMPREHENSIVE METABOLIC PANEL
BKR A/G RATIO: 1.3 (ref 1.0–2.2)
BKR ALANINE AMINOTRANSFERASE (ALT): 23 U/L (ref 10–35)
BKR ALBUMIN: 3.4 g/dL — ABNORMAL LOW (ref 3.6–4.9)
BKR ALKALINE PHOSPHATASE: 97 U/L (ref 9–122)
BKR ANION GAP: 9 (ref 7–17)
BKR ASPARTATE AMINOTRANSFERASE (AST): 17 U/L (ref 10–35)
BKR AST/ALT RATIO: 0.7
BKR BILIRUBIN TOTAL: 0.4 mg/dL (ref <=1.2)
BKR BLOOD UREA NITROGEN: 26 mg/dL — ABNORMAL HIGH (ref 8–23)
BKR BUN / CREAT RATIO: 5.1 % — ABNORMAL LOW (ref 8.0–23.0)
BKR CALCIUM: 9.3 mg/dL (ref 8.8–10.2)
BKR CHLORIDE: 103 mmol/L — ABNORMAL HIGH (ref 98–107)
BKR CO2: 29 mmol/L — ABNORMAL HIGH (ref 20–30)
BKR CREATININE: 5.06 mg/dL — CR (ref 0.40–1.30)
BKR EGFR, CREATININE (CKD-EPI 2021): 9 mL/min/1.73m2 — ABNORMAL LOW (ref >=60–1.00)
BKR GLOBULIN: 2.6 g/dL (ref 2.3–3.5)
BKR GLUCOSE: 148 mg/dL — ABNORMAL HIGH (ref 70–100)
BKR POTASSIUM: 5.4 mmol/L — ABNORMAL HIGH (ref 3.3–5.1)
BKR PROTEIN TOTAL: 6 g/dL — ABNORMAL LOW (ref 6.6–8.7)
BKR SODIUM: 141 mmol/L (ref 136–144)
BKR WAM HEMOGLOBIN: 141 mmol/L — ABNORMAL LOW (ref 136–144)

## 2021-06-21 NOTE — Progress Notes
Patient arrives in the office in a wheelchair for a CBC and to see the practice nurse.  Patient is having dialysis 3 times a week.  Patient reports that she saw her cardiologist and may possibly need a pacemaker.  H&H today is 9.4 and 30.3.  Encouraged patient to call with symptoms or questions.  Patient to return 07-05-2029

## 2021-06-22 ENCOUNTER — Encounter
Admit: 2021-06-22 | Payer: PRIVATE HEALTH INSURANCE | Attending: Student in an Organized Health Care Education/Training Program

## 2021-06-22 NOTE — Progress Notes
Critical Lab Results Notification Reason for call: Chief Complaint Patient presents with ? Critical Value Alert  Name of notified Provider: Dr Kennedy Bucker Name of Critical Lab Test/Result:  Creatinine 5.06Patient on dialysis

## 2021-06-24 HISTORY — PX: CARDIAC CATHETERIZATION: SHX172

## 2021-06-27 ENCOUNTER — Telehealth
Admit: 2021-06-27 | Payer: PRIVATE HEALTH INSURANCE | Attending: Student in an Organized Health Care Education/Training Program

## 2021-06-27 NOTE — Telephone Encounter
Spoke with cardiologist Almyra Brace, seeing patient for ICD placement.  Concerned for iron overload leading to cardiomyopathy, ferritin last time was 4000.  He is going to consider cardiac MRI.  We will look into and start her on chelation therapy

## 2021-07-03 ENCOUNTER — Encounter: Admit: 2021-07-03 | Payer: PRIVATE HEALTH INSURANCE

## 2021-07-05 ENCOUNTER — Ambulatory Visit: Admit: 2021-07-05 | Payer: PRIVATE HEALTH INSURANCE

## 2021-07-05 ENCOUNTER — Telehealth
Admit: 2021-07-05 | Payer: PRIVATE HEALTH INSURANCE | Attending: Student in an Organized Health Care Education/Training Program

## 2021-07-05 NOTE — Telephone Encounter
Spoke to patient.  Patient reports that she was at dialysis yesterday and they had to call an ambulance because she was feeling weak and tired.  Patient reports that she is still in [facility] ER and will call when she is discharged from the ER or discharged from the hospital if she ends up being admitted. Dr Kennedy Bucker notified

## 2021-07-05 NOTE — Telephone Encounter
[id number] called in regards to their upcoming appointment today 1/12. Patient will need to reschedule this appointment. They are currently in [facility] ER. Patient can be reached at [phone]

## 2021-07-05 NOTE — Telephone Encounter
Lisa [name] to speak with pt and let us know how to proceed.

## 2021-07-16 ENCOUNTER — Ambulatory Visit: Admit: 2021-07-16 | Payer: PRIVATE HEALTH INSURANCE

## 2021-07-16 ENCOUNTER — Ambulatory Visit
Admit: 2021-07-16 | Payer: PRIVATE HEALTH INSURANCE | Attending: Student in an Organized Health Care Education/Training Program

## 2021-07-18 ENCOUNTER — Telehealth
Admit: 2021-07-18 | Payer: PRIVATE HEALTH INSURANCE | Attending: Student in an Organized Health Care Education/Training Program

## 2021-07-18 NOTE — Telephone Encounter
Pt cancelling appt for 1/26 has been admitted to hospital for myasthenia gravis flare up

## 2021-07-19 ENCOUNTER — Ambulatory Visit: Admit: 2021-07-19 | Payer: PRIVATE HEALTH INSURANCE

## 2021-07-19 ENCOUNTER — Ambulatory Visit
Admit: 2021-07-19 | Payer: PRIVATE HEALTH INSURANCE | Attending: Student in an Organized Health Care Education/Training Program

## 2021-07-24 ENCOUNTER — Telehealth
Admit: 2021-07-24 | Payer: PRIVATE HEALTH INSURANCE | Attending: Student in an Organized Health Care Education/Training Program

## 2021-07-24 ENCOUNTER — Encounter: Admit: 2021-07-24 | Payer: PRIVATE HEALTH INSURANCE | Attending: Vascular and Interventional Radiology

## 2021-07-24 NOTE — Telephone Encounter
Returning patients call for follow up with Dr.Grant. Lvm with appointment date and time.

## 2021-07-24 NOTE — Telephone Encounter
Pt needs to schedule appt for a hospital folllow up with in 2 weeks,pt will be discharged today 07/24/21 dx acute myasthenia gravis w/acute exacerbation,pt can be reached at [phone]

## 2021-08-03 ENCOUNTER — Ambulatory Visit
Admit: 2021-08-03 | Payer: PRIVATE HEALTH INSURANCE | Attending: Student in an Organized Health Care Education/Training Program

## 2021-08-03 ENCOUNTER — Inpatient Hospital Stay: Admit: 2021-08-03 | Discharge: 2021-08-03 | Payer: PRIVATE HEALTH INSURANCE

## 2021-08-03 ENCOUNTER — Telehealth
Admit: 2021-08-03 | Payer: PRIVATE HEALTH INSURANCE | Attending: Student in an Organized Health Care Education/Training Program

## 2021-08-03 ENCOUNTER — Encounter
Admit: 2021-08-03 | Payer: PRIVATE HEALTH INSURANCE | Attending: Student in an Organized Health Care Education/Training Program

## 2021-08-03 DIAGNOSIS — I82409 Acute embolism and thrombosis of unspecified deep veins of unspecified lower extremity: Secondary | ICD-10-CM

## 2021-08-03 DIAGNOSIS — D61818 Other pancytopenia: Secondary | ICD-10-CM

## 2021-08-03 DIAGNOSIS — D219 Benign neoplasm of connective and other soft tissue, unspecified: Secondary | ICD-10-CM

## 2021-08-03 DIAGNOSIS — M5136 Other intervertebral disc degeneration, lumbar region: Secondary | ICD-10-CM

## 2021-08-03 DIAGNOSIS — Z9889 Other specified postprocedural states: Secondary | ICD-10-CM

## 2021-08-03 DIAGNOSIS — E669 Obesity, unspecified: Secondary | ICD-10-CM

## 2021-08-03 DIAGNOSIS — E785 Hyperlipidemia, unspecified: Secondary | ICD-10-CM

## 2021-08-03 DIAGNOSIS — R0609 Other forms of dyspnea: Secondary | ICD-10-CM

## 2021-08-03 DIAGNOSIS — M1990 Unspecified osteoarthritis, unspecified site: Secondary | ICD-10-CM

## 2021-08-03 DIAGNOSIS — Z9089 Acquired absence of other organs: Secondary | ICD-10-CM

## 2021-08-03 DIAGNOSIS — I739 Peripheral vascular disease, unspecified: Secondary | ICD-10-CM

## 2021-08-03 DIAGNOSIS — G7000 Myasthenia gravis without (acute) exacerbation: Secondary | ICD-10-CM

## 2021-08-03 DIAGNOSIS — E119 Type 2 diabetes mellitus without complications: Secondary | ICD-10-CM

## 2021-08-03 DIAGNOSIS — G5700 Lesion of sciatic nerve, unspecified lower limb: Secondary | ICD-10-CM

## 2021-08-03 DIAGNOSIS — N186 End stage renal disease: Secondary | ICD-10-CM

## 2021-08-03 DIAGNOSIS — Z90710 Acquired absence of both cervix and uterus: Secondary | ICD-10-CM

## 2021-08-03 DIAGNOSIS — I10 Essential (primary) hypertension: Secondary | ICD-10-CM

## 2021-08-03 LAB — COMPREHENSIVE METABOLIC PANEL
BKR A/G RATIO: 2.3 — ABNORMAL HIGH (ref 1.0–2.2)
BKR ALANINE AMINOTRANSFERASE (ALT): 22 U/L (ref 10–35)
BKR ALBUMIN: 4.4 g/dL (ref 3.6–4.9)
BKR ANION GAP: 11 (ref 7–17)
BKR ASPARTATE AMINOTRANSFERASE (AST): 31 U/L (ref 10–35)
BKR AST/ALT RATIO: 1.4 x 1000/??L (ref 0.00–1.00)
BKR BILIRUBIN TOTAL: 0.6 mg/dL (ref <=1.2)
BKR BLOOD UREA NITROGEN: 24 mg/dL — ABNORMAL HIGH (ref 8–23)
BKR BUN / CREAT RATIO: 4.6 % — ABNORMAL LOW (ref 8.0–23.0)
BKR CALCIUM: 9.3 mg/dL (ref 8.8–10.2)
BKR CHLORIDE: 102 mmol/L (ref 98–107)
BKR CO2: 28 mmol/L (ref 20–30)
BKR CREATININE: 5.27 mg/dL — CR (ref 0.40–1.30)
BKR EGFR, CREATININE (CKD-EPI 2021): 8 mL/min/{1.73_m2} — ABNORMAL LOW (ref >=60)
BKR GLOBULIN: 1.9 g/dL — ABNORMAL LOW (ref 2.3–3.5)
BKR GLUCOSE: 164 mg/dL — ABNORMAL HIGH (ref 70–100)
BKR POTASSIUM: 4.7 mmol/L (ref 3.3–5.1)
BKR PROTEIN TOTAL: 6.3 g/dL — ABNORMAL LOW (ref 6.6–8.7)
BKR SODIUM: 141 mmol/L — ABNORMAL LOW (ref 136–144)

## 2021-08-03 LAB — FERRITIN: BKR FERRITIN: 1915 ng/mL — ABNORMAL HIGH (ref 15–150)

## 2021-08-03 LAB — IRON AND TIBC
BKR IRON SATURATION: 28 mmol/L (ref 20–30)
BKR IRON: 56 ug/dL (ref 37–145)

## 2021-08-03 LAB — LACTATE DEHYDROGENASE: BKR LACTATE DEHYDROGENASE: 227 U/L (ref 122–241)

## 2021-08-03 NOTE — Telephone Encounter
Patient is running a few minutes late.her dialysis ran over a little but she is on her way.

## 2021-08-03 NOTE — Progress Notes
RE: Lisa [name]: 1952/01/31MRN: [id number] PROVIDER: Ishmael Holter, MDDATE: 02/10/2023REASON FOR VISIT:Chief Complaint Patient presents with ? Follow-up Visit DIAGNOSIS:Encounter Diagnosis Name Primary? ? 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 Pt reports that no biopsy has been done due to the location and possibly complications; GI team is monitoring.HEMATOLOGY HISTORY:Anemia of ESRD on dialysisCURRENT TREATMENT:IV iron and ESA with dialysis.Last blood transfusion x 2 units in 8/2022HISTORY OF PRESENT ILLNESS: Lisa [name] is a 71 y.o. female who is presenting for a follow-up of anemia of CKD. She is accompanied today by her cousin.Lisa [name] today says she had a recent admission to hospital on 07/17/2021 due to exacerbation of myasthenia gravis. She was at [facility]. Lisa [name] until 07/24/2021. She felt muscular weakness, she was having dyspnea, and felt bad enough so she went to ED at [facility]. She was put back on prednisone. She did not require blood transfusion. She says she has not required a transfusion since the fall. While in [place], she did require dialysis. She received plasmapheresis three times.She is going to have her cardiac pressure examined soon.She said she feels fatigued since leaving hospital. She still has muscle weakness. She sees Dr. Marlyne Beards for myasthenia gravis. He had her restart prednisone 5 mg.She gets dialysis on Monday, Wednesday, Friday.She says she is still experiencing unintentional weight loss as she has decreased interest in food. She has lost 8 pounds since October.She denies vision changes, though she does say she has glaucoma. She has an ophthalmologist.She has increased LE edema since her stay at the hospital. She has to use a walker when not ambulating  in her home.Her cardiologist is Dr. Marcelo Baldy.REVIEW OF SYSTEMS:Constitiutional: Fatigue. Unintentional weight loss due to loss of appetite. Ambulates with walker.Eyes: GlaucomaENT: No hearing impairment. No swallowing problems. Cardivascular: No chest pain,palpitations or orthopnea.Respiratory: As per the HPI aboveGI: No abdominal pain,nausea no vomiting. Bowel movement okGU: Denies frequency or urgencyMusculoskeletal: Positive arthralgia and muscle weakness.Skin: No rashes or itching. Neurological: No dizziness or headaches. Behaviour/psychiatric: Affect is normal.MEDICAL HISTORY:Past Medical History: Diagnosis Date ? Arthritis 02/09/2018 ? Bulging lumbar disc 02/09/2018 ? Deep vein thrombosis (DVT) of lower extremity (HC Code) 02/09/2018 ? Diabetes mellitus, type II (HC Code) 02/09/2018 ? ESRD (end stage renal disease) (HC Code) (HC CODE) (HC Code) 02/09/2018 ? Exertional dyspnea 02/09/2018 ? Fibroids 02/09/2018 ? History of thoracotomy 02/09/2018 ? HTN (hypertension) 02/09/2018 ? Hx of tonsillectomy 02/09/2018 ? Hyperlipidemia 02/09/2018 ? Myasthenia gravis (HC Code) 02/09/2018 ? Obesity 02/09/2018 ? PVD (peripheral vascular disease) (HC Code) (HC CODE) (HC Code) 03/26/2018 ? S/P TAH-BSO 02/09/2018 ? 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 BD ULTRA-FINE MINI PEN NEEDLE 31 gauge x 3/16 Ndle USE 1 PEN NEEDLE ONCE DAILY   ? carvediloL (COREG) 25 mg Immediate Release tablet TAKE 1 TABLET BY MOUTH TWICE DAILY WITH MEALS   ? cyanocobalamin, vitamin B-12, 2,500 mcg Subl DISSOLVE 1 TABLET ONCE DAILY IN THE MORNING FOR 90 DAYS   ? doxazosin (CARDURA) 4 mg tablet TAKE 1 TABLET BY MOUTH ONCE DAILY AT NIGHT   ? furosemide (LASIX) 40 mg tablet furosemide 40 mg tablet 1 tablet twice a day   ? insulin degludec (TRESIBA FLEXTOUCH U-200 INSULIN) 200 unit/mL (3 mL) pen INJECT 4 UNITS SUBCUTANEOUSLY EVERY DAY. DISCARD PEN 56 DAYS AFTER OPENING   ? loperamide (IMODIUM) 2 mg capsule Take 2 mg by mouth.    ? olmesartan (BENICAR)  40 MG tablet Take 40 mg by mouth daily.   ? pravastatin (PRAVACHOL) 80 mg tablet TAKE 1 TABLET BY MOUTH ONCE DAILY   ? predniSONE (DELTASONE) 1 mg tablet daily.   ? sacubitriL-valsartan (ENTRESTO) 24-26 mg tablet Entresto 24 mg-26 mg tablet Take 1 tablet by mouth twice daily   ? sertraline (ZOLOFT) 50 mg tablet TAKE 1 TABLET BY MOUTH ONCE DAILY   ? warfarin (COUMADIN) 5 mg tablet every 24 hours.   No current facility-administered medications for this visit. SURGICAL HISTORY: No past surgical history on file.SOCIAL HISTORY:Social History Socioeconomic History ? Marital status: Single Tobacco Use ? Smoking status: Never Substance and Sexual Activity ? Alcohol use: Not Currently FAMILY HISTORY:Family History Problem Relation Age of Onset ? Breast cancer Mother  ? Stomach cancer Brother  ? Cervical cancer Paternal Grandmother  PHYSICAL EXAM: BP (!) 142/59 (Site: l a, Position: Sitting, Cuff Size: Medium)  - Pulse 83  - Temp 98.1 ?F (36.7 ?C) (Temporal)  - Resp 20  - Wt 55.3 kg  - SpO2 99%  - BMI 23.04 kg/m?  GEN: appears thin, NAD, looks fatigued. Using walker.HEENT: NC/AT, PERRL, OP clear, MMMNeck: No cervical LADPULM: CTABCV: RRR, no m/r/gGI: Soft, NTNDEXT: LE edema.Neuro: AAOx4Performance Status ECOG:  20- Full activity1- Restricted strenuous activity2- ambulatory, can self care unable to work, up>50% waking hours 3- limited self care, bed-chair >50% waking hours4- bed-chair confinedDATA REVIEW:Lab Results Component Value Date  WBC 6.4 06/21/2021  HGB 9.4 (L) 06/21/2021  HCT 30.30 (L) 06/21/2021  MCV 107.4 (H) 06/21/2021  PLT 129 (L) 06/21/2021   Chemistry    Component Value Date/Time  NA 141 08/03/2021 1511  K 4.7 08/03/2021 1511  CL 102 08/03/2021 1511  CO2 28 08/03/2021 1511  BUN 24 (H) 08/03/2021 1511  CREATININE 5.27 (HH) 08/03/2021 1511  GLU 164 (H) 08/03/2021 1511  PROT 6.3 (L) 08/03/2021 1511    Component Value Date/Time  CALCIUM 9.3 08/03/2021 1511  ALKPHOS 65 08/03/2021 1511  AST 31 08/03/2021 1511  ALT 22 08/03/2021 1511  BILITOT 0.6 08/03/2021 1511  ALBUMIN 4.4 08/03/2021 1511  Lab Results Component Value Date  IRON 56 08/03/2021  TIBC  08/03/2021    Comment:    Result not valid; Ferritin is >1200 ng/mL.  FERRITIN 1,915 (H) 08/03/2021 Lab Results Component Value Date  RETICCTPCT 3.3 (H) 06/07/2021 Lab Results Component Value Date  VITAMINB12 1,168 05/04/2021 U/S: 8/11/22COMPARISON: Correlation is made with prior study performed 04/11/2020. Ultrasound exam of the right lower extremity deep venous system from the groin to the popliteal fossa with high resolution B-Mode imaging, color flow Doppler imaging and Doppler spectral analysis without and with transducer venous compression and with venous flow augmentation. A duplicated right femoral vein is present. The right common femoral vein demonstrates normal compressibility, normal patency and unidirectional color flow, normal flow with respiratory phasicity and normal flow augmentation responses. A duplicated femoral vein is present. Grayscale interrogation reveals no compression of a femoral vein. Color Doppler shows no flow through this vessel. The findings are compatible with occlusion of one of the femoral veins. Popliteal vein shows incomplete compression and color flow compatible with nonocclusive thrombus. Color Doppler shows incomplete flow in the trifurcation extending to the posterior tibial and peroneal?veins. ?There is no thrombosis or occlusion. ?There is no popliteal cyst. 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 There is nonocclusive thrombus within the popliteal vein as well as the peroneal and posterior tibial veins in the calf. IMPRESSION &  PLAN:Klare Duffy is a 71 y.o. 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  However this was not associated with an increase in schistocytes and her haptoglobin started to rebound.  She was also taken off azathioprine due to myelosuppressive affects.  She ultimately had workup and was not found to have an etiology for microangiopathic hemolytic anemia.  She was having issues with her blood pressure control and so this may have contributed.  Thankfully she has not required a transfusion for some time.-CBC not back of time the visit.-follow up in 1 month with blood work2)DVTs:-recent DVT not called chronic or acute-on warfarin-warfarin monitoring with INR 2-3, she has INR titration in dialysis facility3) iron overloadFrom exogenous iron administration during dialysis.  We recommended ceasing iron infusions in October and since then her ferritin has fallen from 4000 to 1900.  Her liver function pass appear normal.  I do not think this contributed to her cardiomyopathy although I will discuss with her cardiologist Dr. Marcelo Baldy.  If thought to be a potential culprit, we could do chelation therapy.4) mild thrombocytopeniaConcern initially for MAHA.  She is also at risk for ITP given her history of autoimmunity.  Ultimately there was no discovered etiology for her thrombocytopenia.  We will follow up blood work today-follow up in 1 month blood workVisit in 1 monthScribed for Ishmael Holter, MD by Genelle Gather, medical scribe August 03, 2021  The documentation recorded by the scribe accurately reflects the services I personally performed and the decisions made by me. I reviewed and confirmed all material entered and/or pre-charted by the scribe. Ishmael Holter, MDYale Cancer CenterAsst. Prof. Medicine, Sunol UniversitySmilow [facility]

## 2021-08-04 LAB — PT/INR AND PTT (BH GH L LMW YH)
BKR INR: 2.24 — ABNORMAL HIGH (ref 0.92–1.19)
BKR PARTIAL THROMBOPLASTIN TIME: 36.7 seconds — ABNORMAL HIGH (ref 23.0–31.4)
BKR PROTHROMBIN TIME: 22.3 seconds — ABNORMAL HIGH (ref 9.6–12.3)

## 2021-08-04 LAB — CBC WITH AUTO DIFFERENTIAL
BKR ALKALINE PHOSPHATASE: 0.4 % (ref 0.0–1.0)
BKR WAM ABSOLUTE IMMATURE GRANULOCYTES.: 0.02 x 1000/??L (ref 0.00–0.30)
BKR WAM ABSOLUTE LYMPHOCYTE COUNT.: 0.82 x 1000/??L — ABNORMAL LOW (ref 0.60–3.70)
BKR WAM ABSOLUTE NRBC (2 DEC): 0 x 1000/??L (ref 0.00–1.00)
BKR WAM ANALYZER ANC: 3.56 x 1000/??L (ref 2.00–7.60)
BKR WAM BASOPHIL ABSOLUTE COUNT.: 0.01 x 1000/??L — ABNORMAL LOW (ref 0.00–1.00)
BKR WAM BASOPHILS: 0.2 % (ref 0.0–1.4)
BKR WAM EOSINOPHIL ABSOLUTE COUNT.: 0.06 x 1000/ÂµL (ref 0.00–1.00)
BKR WAM EOSINOPHILS: 1.3 % (ref 0.0–5.0)
BKR WAM HEMATOCRIT (2 DEC): 24.3 % — ABNORMAL LOW (ref 35.00–45.00)
BKR WAM HEMOGLOBIN: 7.5 g/dL — ABNORMAL LOW (ref 11.7–15.5)
BKR WAM IMMATURE GRANULOCYTES: 0.4 % (ref 0.0–1.0)
BKR WAM LYMPHOCYTES: 17.4 % (ref 17.0–50.0)
BKR WAM MCH (PG): 30.7 pg (ref 27.0–33.0)
BKR WAM MCHC: 30.9 g/dL — ABNORMAL LOW (ref 31.0–36.0)
BKR WAM MCV: 99.6 fL (ref 80.0–100.0)
BKR WAM MONOCYTE ABSOLUTE COUNT.: 0.25 x 1000/??L — ABNORMAL HIGH (ref 0.00–1.00)
BKR WAM MONOCYTES: 5.3 % — ABNORMAL LOW (ref 4.0–12.0)
BKR WAM MPV: 13.6 fL — ABNORMAL HIGH (ref 8.0–12.0)
BKR WAM NEUTROPHILS: 75.4 % — ABNORMAL HIGH (ref 39.0–72.0)
BKR WAM NUCLEATED RED BLOOD CELLS: 0 % (ref 0.0–1.0)
BKR WAM PLATELETS: 83 x1000/??L — ABNORMAL LOW (ref 150–420)
BKR WAM RDW-CV: 17.3 % — ABNORMAL HIGH (ref 11.0–15.0)
BKR WAM RED BLOOD CELL COUNT.: 2.44 M/??L — ABNORMAL LOW (ref 4.00–6.00)
BKR WAM WHITE BLOOD CELL COUNT: 4.7 x1000/??L (ref 4.0–11.0)

## 2021-08-04 LAB — IMMATURE PLATELET FRACTION (BH GH YH)
BKR WAM IPF, ABSOLUTE: 8.8 x1000/ÂµL (ref <20.0)
BKR WAM IPF: 10.6 % — ABNORMAL HIGH (ref 1.2–8.6)

## 2021-08-04 LAB — HAPTOGLOBIN: BKR HAPTOGLOBIN: 106 mg/dL (ref 30–200)

## 2021-08-04 LAB — FIBRINOGEN     (BH GH L LMW YH): BKR FIBRINOGEN LEVEL: 298 mg/dL (ref 194–448)

## 2021-08-06 ENCOUNTER — Encounter
Admit: 2021-08-06 | Payer: PRIVATE HEALTH INSURANCE | Attending: Student in an Organized Health Care Education/Training Program

## 2021-08-06 ENCOUNTER — Telehealth
Admit: 2021-08-06 | Payer: PRIVATE HEALTH INSURANCE | Attending: Student in an Organized Health Care Education/Training Program

## 2021-08-06 DIAGNOSIS — N185 Chronic kidney disease, stage 5: Secondary | ICD-10-CM

## 2021-08-06 NOTE — Telephone Encounter
Pt would like labs results from 08/02/21 ,Lupe can be reached at [phone]

## 2021-08-06 NOTE — Telephone Encounter
Called Alleyah to let her know her hemoglobin 7.5.  She does not feel much different.  There is no evidence of microangiopathic hemolytic anemia.  We will repeat her labs again in 2 weeks and see if she needs a transfusion.  She can have RN visit only that day.  I will keep her appointment in April at which time I can discuss with her whether she will need to have bone marrow biopsy.

## 2021-08-07 ENCOUNTER — Telehealth
Admit: 2021-08-07 | Payer: PRIVATE HEALTH INSURANCE | Attending: Student in an Organized Health Care Education/Training Program

## 2021-08-07 NOTE — Telephone Encounter
Lvm to set up appointment for blood work and nurse check for 1 week.

## 2021-08-17 ENCOUNTER — Encounter: Admit: 2021-08-17 | Payer: PRIVATE HEALTH INSURANCE

## 2021-08-17 ENCOUNTER — Inpatient Hospital Stay: Admit: 2021-08-17 | Discharge: 2021-08-17 | Payer: PRIVATE HEALTH INSURANCE

## 2021-08-17 ENCOUNTER — Ambulatory Visit: Admit: 2021-08-17 | Payer: PRIVATE HEALTH INSURANCE

## 2021-08-17 DIAGNOSIS — N185 Chronic kidney disease, stage 5: Secondary | ICD-10-CM

## 2021-08-17 DIAGNOSIS — E785 Hyperlipidemia, unspecified: Secondary | ICD-10-CM

## 2021-08-17 DIAGNOSIS — D631 Anemia in chronic kidney disease: Secondary | ICD-10-CM

## 2021-08-17 DIAGNOSIS — E119 Type 2 diabetes mellitus without complications: Secondary | ICD-10-CM

## 2021-08-17 DIAGNOSIS — Z90710 Acquired absence of both cervix and uterus: Secondary | ICD-10-CM

## 2021-08-17 DIAGNOSIS — G7000 Myasthenia gravis without (acute) exacerbation: Secondary | ICD-10-CM

## 2021-08-17 DIAGNOSIS — N186 End stage renal disease: Secondary | ICD-10-CM

## 2021-08-17 DIAGNOSIS — Z9089 Acquired absence of other organs: Secondary | ICD-10-CM

## 2021-08-17 DIAGNOSIS — R0609 Other forms of dyspnea: Secondary | ICD-10-CM

## 2021-08-17 DIAGNOSIS — D219 Benign neoplasm of connective and other soft tissue, unspecified: Secondary | ICD-10-CM

## 2021-08-17 DIAGNOSIS — I10 Essential (primary) hypertension: Secondary | ICD-10-CM

## 2021-08-17 DIAGNOSIS — I739 Peripheral vascular disease, unspecified: Secondary | ICD-10-CM

## 2021-08-17 DIAGNOSIS — M1990 Unspecified osteoarthritis, unspecified site: Secondary | ICD-10-CM

## 2021-08-17 DIAGNOSIS — I82409 Acute embolism and thrombosis of unspecified deep veins of unspecified lower extremity: Secondary | ICD-10-CM

## 2021-08-17 DIAGNOSIS — E669 Obesity, unspecified: Secondary | ICD-10-CM

## 2021-08-17 DIAGNOSIS — G5700 Lesion of sciatic nerve, unspecified lower limb: Secondary | ICD-10-CM

## 2021-08-17 DIAGNOSIS — M5136 Other intervertebral disc degeneration, lumbar region: Secondary | ICD-10-CM

## 2021-08-17 DIAGNOSIS — Z9889 Other specified postprocedural states: Secondary | ICD-10-CM

## 2021-08-17 LAB — CBC WITH AUTO DIFFERENTIAL
BKR WAM ABSOLUTE IMMATURE GRANULOCYTES.: 0.02 x 1000/??L (ref 0.00–0.30)
BKR WAM ABSOLUTE LYMPHOCYTE COUNT.: 1.81 x 1000/??L (ref 0.60–3.70)
BKR WAM ABSOLUTE NRBC (2 DEC): 0 x 1000/??L — ABNORMAL HIGH (ref 0.00–1.00)
BKR WAM ANALYZER ANC: 2.79 x 1000/??L (ref 2.00–7.60)
BKR WAM BASOPHIL ABSOLUTE COUNT.: 0.02 x 1000/??L — ABNORMAL LOW (ref 0.00–1.00)
BKR WAM BASOPHILS: 0.4 % (ref 0.0–1.4)
BKR WAM EOSINOPHIL ABSOLUTE COUNT.: 0.17 x 1000/??L (ref 0.00–1.00)
BKR WAM EOSINOPHILS: 3.2 % (ref 0.0–5.0)
BKR WAM HEMATOCRIT (2 DEC): 24.7 % — ABNORMAL LOW (ref 35.00–45.00)
BKR WAM HEMOGLOBIN: 7.4 g/dL — ABNORMAL LOW (ref 11.7–15.5)
BKR WAM IMMATURE GRANULOCYTES: 0.4 % — ABNORMAL LOW (ref 0.0–1.0)
BKR WAM LYMPHOCYTES: 34 % (ref 17.0–50.0)
BKR WAM MCH (PG): 30.7 pg (ref 27.0–33.0)
BKR WAM MCHC: 30 g/dL — ABNORMAL LOW (ref 31.0–36.0)
BKR WAM MCV: 102.5 fL — ABNORMAL HIGH (ref 80.0–100.0)
BKR WAM MONOCYTE ABSOLUTE COUNT.: 0.52 x 1000/??L (ref 0.00–1.00)
BKR WAM MONOCYTES: 9.8 % — ABNORMAL HIGH (ref 4.0–12.0)
BKR WAM MPV: 11.3 fL (ref 8.0–12.0)
BKR WAM NEUTROPHILS: 52.2 % (ref 39.0–72.0)
BKR WAM NUCLEATED RED BLOOD CELLS: 0 % (ref 0.0–1.0)
BKR WAM PLATELETS: 121 x1000/??L — ABNORMAL LOW (ref 150–420)
BKR WAM RDW-CV: 18.3 % — ABNORMAL HIGH (ref 11.0–15.0)
BKR WAM RED BLOOD CELL COUNT.: 2.41 M/??L — ABNORMAL LOW (ref 4.00–6.00)
BKR WAM WHITE BLOOD CELL COUNT: 5.3 x1000/??L (ref 4.0–11.0)

## 2021-08-17 LAB — RETICULOCYTES
BKR WAM IRF: 17.1 % — ABNORMAL HIGH (ref 3.0–15.9)
BKR WAM RETICULOCYTE - ABS (3 DEC): 0.036 10??6 cells/uL (ref 0.023–0.140)
BKR WAM RETICULOCYTE COUNT PCT (1 DEC): 1.5 % (ref 0.6–2.7)
BKR WAM RETICULOCYTE HGB EQUIVALENT: 32.3 pg (ref 28.2–35.7)

## 2021-08-17 LAB — IMMATURE PLATELET FRACTION (BH GH YH)
BKR WAM IPF, ABSOLUTE: 6.1 x1000/??L (ref <20.0)
BKR WAM IPF: 5 % (ref 1.2–8.6)

## 2021-08-17 NOTE — Progress Notes
Arrived for RN visit only for lab check, Hbg 7.4/Hct 24.70. Dr. Kennedy Bucker ordered blood transfusion of 2 units of PRBCs, Pt. will have transfusion on 08/24/21. Pt. would like Dr. Kennedy Bucker to know that Dr. Alfredo Martinez is concerned that iron level is being adversely effected by dialysis would like to make sure that the doctors have communicated. Transfusion orders sent to [facility] and placed in scan bin.Pt. left in stable via WC, will call with any issues and will return on 09/06/21.Electronically Signed by Steva Ready, RN, August 17, 2021

## 2021-08-27 ENCOUNTER — Telehealth
Admit: 2021-08-27 | Payer: PRIVATE HEALTH INSURANCE | Attending: Student in an Organized Health Care Education/Training Program

## 2021-08-27 NOTE — Telephone Encounter
Pt called to confirm Appt on 3.16.23 3:00 Blood Draw , 3:30 Dr Jeraldine Loots  Pt verbally Understood and confirmed Appt

## 2021-09-06 ENCOUNTER — Encounter: Admit: 2021-09-06 | Payer: PRIVATE HEALTH INSURANCE | Attending: Medical Oncology

## 2021-09-06 ENCOUNTER — Ambulatory Visit: Admit: 2021-09-06 | Payer: PRIVATE HEALTH INSURANCE | Attending: Medical Oncology

## 2021-09-06 ENCOUNTER — Inpatient Hospital Stay: Admit: 2021-09-06 | Discharge: 2021-09-06 | Payer: PRIVATE HEALTH INSURANCE

## 2021-09-06 DIAGNOSIS — I10 Essential (primary) hypertension: Secondary | ICD-10-CM

## 2021-09-06 DIAGNOSIS — N185 Chronic kidney disease, stage 5: Secondary | ICD-10-CM

## 2021-09-06 DIAGNOSIS — D61818 Other pancytopenia: Secondary | ICD-10-CM

## 2021-09-06 DIAGNOSIS — N186 End stage renal disease: Secondary | ICD-10-CM

## 2021-09-06 DIAGNOSIS — D631 Anemia in chronic kidney disease: Secondary | ICD-10-CM

## 2021-09-06 DIAGNOSIS — D219 Benign neoplasm of connective and other soft tissue, unspecified: Secondary | ICD-10-CM

## 2021-09-06 DIAGNOSIS — G5700 Lesion of sciatic nerve, unspecified lower limb: Secondary | ICD-10-CM

## 2021-09-06 DIAGNOSIS — E119 Type 2 diabetes mellitus without complications: Secondary | ICD-10-CM

## 2021-09-06 DIAGNOSIS — G7000 Myasthenia gravis without (acute) exacerbation: Secondary | ICD-10-CM

## 2021-09-06 DIAGNOSIS — M1990 Unspecified osteoarthritis, unspecified site: Secondary | ICD-10-CM

## 2021-09-06 DIAGNOSIS — Z9089 Acquired absence of other organs: Secondary | ICD-10-CM

## 2021-09-06 DIAGNOSIS — I739 Peripheral vascular disease, unspecified: Secondary | ICD-10-CM

## 2021-09-06 DIAGNOSIS — Z90710 Acquired absence of both cervix and uterus: Secondary | ICD-10-CM

## 2021-09-06 DIAGNOSIS — Z9889 Other specified postprocedural states: Secondary | ICD-10-CM

## 2021-09-06 DIAGNOSIS — E669 Obesity, unspecified: Secondary | ICD-10-CM

## 2021-09-06 DIAGNOSIS — M5136 Other intervertebral disc degeneration, lumbar region: Secondary | ICD-10-CM

## 2021-09-06 DIAGNOSIS — R0609 Other forms of dyspnea: Secondary | ICD-10-CM

## 2021-09-06 DIAGNOSIS — E785 Hyperlipidemia, unspecified: Secondary | ICD-10-CM

## 2021-09-06 DIAGNOSIS — I82409 Acute embolism and thrombosis of unspecified deep veins of unspecified lower extremity: Secondary | ICD-10-CM

## 2021-09-06 LAB — CBC WITH AUTO DIFFERENTIAL
BKR WAM ABSOLUTE IMMATURE GRANULOCYTES.: 0.01 x 1000/??L (ref 0.00–0.30)
BKR WAM ABSOLUTE LYMPHOCYTE COUNT.: 0.69 x 1000/??L (ref 0.60–3.70)
BKR WAM ABSOLUTE NRBC (2 DEC): 0 x 1000/??L (ref 0.00–1.00)
BKR WAM ANALYZER ANC: 2.58 x 1000/??L (ref 2.00–7.60)
BKR WAM BASOPHIL ABSOLUTE COUNT.: 0.03 x 1000/??L (ref 0.00–1.00)
BKR WAM BASOPHILS: 0.8 % (ref 0.0–1.4)
BKR WAM EOSINOPHIL ABSOLUTE COUNT.: 0.07 x 1000/??L (ref 0.00–1.00)
BKR WAM EOSINOPHILS: 1.8 % (ref 0.0–5.0)
BKR WAM HEMATOCRIT (2 DEC): 31 % — ABNORMAL LOW (ref 35.00–45.00)
BKR WAM HEMOGLOBIN: 9.4 g/dL — ABNORMAL LOW (ref 11.7–15.5)
BKR WAM IMMATURE GRANULOCYTES: 0.3 % (ref 0.0–1.0)
BKR WAM LYMPHOCYTES: 17.8 % (ref 17.0–50.0)
BKR WAM MCH (PG): 30.2 pg (ref 27.0–33.0)
BKR WAM MCHC: 30.3 g/dL — ABNORMAL LOW (ref 31.0–36.0)
BKR WAM MCV: 99.7 fL (ref 80.0–100.0)
BKR WAM MONOCYTE ABSOLUTE COUNT.: 0.49 x 1000/??L (ref 0.00–1.00)
BKR WAM MONOCYTES: 12.7 % — ABNORMAL HIGH (ref 4.0–12.0)
BKR WAM NEUTROPHILS: 66.6 % — ABNORMAL LOW (ref 39.0–72.0)
BKR WAM NUCLEATED RED BLOOD CELLS: 0 % (ref 0.0–1.0)
BKR WAM PLATELETS: 86 x1000/??L — ABNORMAL LOW (ref 150–420)
BKR WAM RDW-CV: 17.8 % — ABNORMAL HIGH (ref 11.0–15.0)
BKR WAM RED BLOOD CELL COUNT.: 3.11 M/??L — ABNORMAL LOW (ref 4.00–6.00)
BKR WAM WHITE BLOOD CELL COUNT: 3.9 x1000/??L — ABNORMAL LOW (ref 4.0–11.0)

## 2021-09-06 LAB — COMPREHENSIVE METABOLIC PANEL
BKR A/G RATIO: 1.7 x 1000/??L (ref 1.0–2.2)
BKR ALANINE AMINOTRANSFERASE (ALT): 25 U/L (ref 10–35)
BKR ALBUMIN: 3.7 g/dL — ABNORMAL HIGH (ref 3.6–4.9)
BKR ALKALINE PHOSPHATASE: 69 U/L (ref 9–122)
BKR ANION GAP: 12 pg (ref 7–17)
BKR ASPARTATE AMINOTRANSFERASE (AST): 18 U/L (ref 10–35)
BKR AST/ALT RATIO: 0.7 x 1000/??L (ref 0.00–1.00)
BKR BILIRUBIN TOTAL: 0.5 mg/dL (ref 0.0–<=1.2)
BKR BLOOD UREA NITROGEN: 20 mg/dL — ABNORMAL HIGH (ref 8–23)
BKR BUN / CREAT RATIO: 4.1 — ABNORMAL LOW (ref 8.0–23.0)
BKR CALCIUM: 9.6 mg/dL (ref 8.8–10.2)
BKR CO2: 25 mmol/L (ref 20–30)
BKR CREATININE: 4.82 mg/dL — ABNORMAL HIGH (ref 0.40–1.30)
BKR EGFR, CREATININE (CKD-EPI 2021): 9 mL/min/1.73m2 — ABNORMAL LOW (ref >=60–1.00)
BKR GLOBULIN: 2.2 g/dL — ABNORMAL LOW (ref 2.3–3.5)
BKR GLUCOSE: 169 mg/dL — ABNORMAL HIGH (ref 70–100)
BKR POTASSIUM: 4.4 mmol/L (ref 3.3–5.1)
BKR PROTEIN TOTAL: 5.9 g/dL — ABNORMAL LOW (ref 6.6–8.7)
BKR SODIUM: 139 mmol/L — ABNORMAL LOW (ref 136–144)
BKR WAM MPV: 9.6 mg/dL — ABNORMAL HIGH (ref 8.8–10.2)

## 2021-09-06 LAB — IMMATURE PLATELET FRACTION (BH GH YH)
BKR CHLORIDE: 6.5 x1000/??L — ABNORMAL LOW (ref 98–<20.0)
BKR WAM IPF, ABSOLUTE: 6.5 x1000/??L (ref <20.0)
BKR WAM IPF: 7.6 % — ABNORMAL LOW (ref 1.2–8.6)

## 2021-09-06 LAB — IRON AND TIBC
BKR IRON SATURATION: 734 pg/mL (ref 232–1245)
BKR IRON: 79 ug/dL (ref 37–145)

## 2021-09-06 LAB — FERRITIN: BKR FERRITIN: 1834 ng/mL — ABNORMAL HIGH (ref 15–150)

## 2021-09-06 LAB — VITAMIN B12: BKR VITAMIN B12: 734 pg/mL (ref 232–1245)

## 2021-09-06 NOTE — Progress Notes
RE: Lisa [name]: Dec 14, 1952MRN: [id number] PROVIDER: Manuela Schwartz, APRNDATE: 03/16/2023REASON FOR VISIT:Follow-up visitREFERRING PROVIDER: No ref. provider foundDIAGNOSIS:Encounter Diagnoses Name Primary? ? Anemia of chronic kidney failure, stage 5 (HC Code) Yes ? 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 Pt reports that no biopsy has been done due to the location and possibly complications; GI team is monitoring.HEMATOLOGY HISTORY:Anemia of ESRD on dialysis11/2022-Imuran discontinued with resolution of neutropeniaCURRENT TREATMENT:IV iron and ESA with dialysis.Last blood transfusion x 2 units 3/3/23HISTORY OF PRESENT ILLNESS: Lisa [name] is a 71 y.o. female who is presenting for a follow-up of anemia of CKD, recurrent DVTs, and pancreatic IPMN. She is currently on dialysis receiving ESA.  She presents today to recheck blood counts.She generally feeling well but complains of fatigue.  She received blood transfusion on 08/24/2021 for hemoglobin decreased to 7.4 with some improvement in her symptoms.  She denies any significant bleeding or bruising.  She denies melena or blood per rectum.  She denies recent fever, chills, sweats.  Myasthenia gravis is followed by neurologist Dr. Marlyne Beards. She remains on Coumadin for history of DVT.  INRs are being followed by dialysis.  REVIEW OF SYSTEMS:Review of Systems Constitutional: Negative for appetite change, chills, fatigue and fever. HENT: Negative for facial swelling and trouble swallowing.  Eyes: Negative for pain. Respiratory: Negative for shortness of breath.  Cardiovascular: Negative for leg swelling. Gastrointestinal: Negative for constipation, diarrhea, nausea and vomiting. Skin: Negative for rash. Neurological: Negative for dizziness and headaches. Psychiatric/Behavioral: The patient is not nervous/anxious.  All other systems reviewed and are negative.MEDICAL HISTORY:Past Medical History: Diagnosis Date ? Arthritis 02/09/2018 ? Bulging lumbar disc 02/09/2018 ? Deep vein thrombosis (DVT) of lower extremity (HC Code) 02/09/2018 ? Diabetes mellitus, type II (HC Code) 02/09/2018 ? ESRD (end stage renal disease) (HC Code) (HC CODE) (HC Code) 02/09/2018 ? Exertional dyspnea 02/09/2018 ? Fibroids 02/09/2018 ? History of thoracotomy 02/09/2018 ? HTN (hypertension) 02/09/2018 ? Hx of tonsillectomy 02/09/2018 ? Hyperlipidemia 02/09/2018 ? Myasthenia gravis (HC Code) 02/09/2018 ? Obesity 02/09/2018 ? PVD (peripheral vascular disease) (HC Code) (HC CODE) (HC Code) 03/26/2018 ? S/P TAH-BSO 02/09/2018 ? 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 BD ULTRA-FINE MINI PEN NEEDLE 31 gauge x 3/16 Ndle USE 1 PEN NEEDLE ONCE DAILY   ? carvediloL (COREG) 25 mg Immediate Release tablet TAKE 1 TABLET BY MOUTH TWICE DAILY WITH MEALS   ? cyanocobalamin, vitamin B-12, 2,500 mcg Subl DISSOLVE 1 TABLET ONCE DAILY IN THE MORNING FOR 90 DAYS   ? doxazosin (CARDURA) 4 mg tablet TAKE 1 TABLET BY MOUTH ONCE DAILY AT NIGHT   ? furosemide (LASIX) 40 mg tablet furosemide 40 mg tablet 1 tablet twice a day   ? insulin degludec (TRESIBA FLEXTOUCH U-200 INSULIN) 200 unit/mL (3 mL) pen INJECT 4 UNITS SUBCUTANEOUSLY EVERY DAY. DISCARD PEN 56 DAYS AFTER OPENING   ? loperamide (IMODIUM) 2 mg capsule Take 2 mg by mouth.    ? olmesartan (BENICAR) 40 MG tablet Take 40 mg by mouth daily.   ? pravastatin (PRAVACHOL) 80 mg tablet TAKE 1 TABLET BY MOUTH ONCE DAILY   ? predniSONE (DELTASONE) 1 mg tablet daily.   ? sacubitriL-valsartan (ENTRESTO) 24-26 mg tablet Entresto 24 mg-26 mg tablet Take 1 tablet by mouth twice daily   ? sertraline (ZOLOFT) 50 mg tablet TAKE 1 TABLET BY MOUTH ONCE DAILY   ? warfarin (COUMADIN) 5 mg tablet  every 24 hours.   No current facility-administered medications for this visit. SURGICAL HISTORY: No past surgical history on file.SOCIAL HISTORY:Social History Socioeconomic History ? Marital status: Single Tobacco Use ? Smoking status: Never Substance and Sexual Activity ? Alcohol use: Not Currently FAMILY HISTORY:Family History Problem Relation Age of Onset ? Breast cancer Mother  ? Stomach cancer Brother  ? Cervical cancer Paternal Grandmother  PHYSICAL EXAM: BP 125/74 (Site: l a, Position: Sitting, Cuff Size: Medium)  - Pulse 83  - Temp 98 ?F (36.7 ?C) (Temporal)  - Resp 18  - SpO2 95%  GEN: appears well, NAD, looks fatiguedHEENT: NC/AT, PERRL, OP clear, MMMNeck: No cervical LADPULM: CTABCV: RRR, no m/r/gGI: Soft, NTNDEXT: Minimal RLE edema probably his he listed himself and communicationsNeuro: AAOx4DATA REVIEW:  09/06/2021 ANC 2.5Lab Results Component Value Date  WBC 3.9 (L) 09/06/2021  HGB 9.4 (L) 09/06/2021  HCT 31.00 (L) 09/06/2021  MCV 99.7 09/06/2021  PLT 86 (L) 09/06/2021   Chemistry    Component Value Date/Time  NA 139 09/06/2021 1436  K 4.4 09/06/2021 1436  CL 102 09/06/2021 1436  CO2 25 09/06/2021 1436  BUN 20 09/06/2021 1436  CREATININE 4.82 (H) 09/06/2021 1436  GLU 169 (H) 09/06/2021 1436  PROT 5.9 (L) 09/06/2021 1436    Component Value Date/Time  CALCIUM 9.6 09/06/2021 1436  ALKPHOS 69 09/06/2021 1436  AST 18 09/06/2021 1436  ALT 25 09/06/2021 1436  BILITOT 0.5 09/06/2021 1436  ALBUMIN 3.7 09/06/2021 1436  Lab Results Component Value Date  IRON 79 09/06/2021  TIBC  09/06/2021    Comment:    Result not valid; Ferritin is >1200 ng/mL.  FERRITIN 1,834 (H) 09/06/2021 Lab Results Component Value Date  RETICCTPCT 1.5 08/17/2021 Lab Results Component Value Date  VITAMINB12 1,168 05/04/2021 U/S: 8/11/22COMPARISON: Correlation is made with prior study performed 04/11/2020. Ultrasound exam of the right lower extremity deep venous system from the groin to the popliteal fossa with high resolution B-Mode imaging, color flow Doppler imaging and Doppler spectral analysis without and with transducer venous compression and with venous flow augmentation. A duplicated right femoral vein is present. The right common femoral vein demonstrates normal compressibility, normal patency and unidirectional color flow, normal flow with respiratory phasicity and normal flow augmentation responses. A duplicated femoral vein is present. Grayscale interrogation reveals no compression of a femoral vein. Color Doppler shows no flow through this vessel. The findings are compatible with occlusion of one of the femoral veins. Popliteal vein shows incomplete compression and color flow compatible with nonocclusive thrombus. Color Doppler shows incomplete flow in the trifurcation extending to the posterior tibial and peroneal?veins. ?There is no thrombosis or occlusion. ?There is no popliteal cyst. 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 There is nonocclusive thrombus within the popliteal vein as well as the peroneal and posterior tibial veins in the calf. IMPRESSION & PLAN:Lisa [name] is a 71 y.o. 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  She presents today with stable anemia likely due to receiving blood transfusion on 08/24/2021.Anemia of CKD-Her hemoglobin is stable following blood transfusion.  She continues to receive ESA through the dialysis unit.  No further intervention on our part.  Iron overload- Exogenous iron infusions through dialysis have been discontinued per Dr. Kennedy Bucker given ferritin level of 4000. Neutropenia-resolved with discontinuation of ImuranThrombocytopenia-platelet count decreased from previous visit from 121 to 86 however this is still within her recent ranges DVTs:  Treated with Coumadin- INR titration in  dialysis facility.  No bleeding events or recurrent thrombosis.  IPMN-followed by Dr. Suzan Slick, stable Plan:Return in 4 weeks for follow-up evaluation of blood countsConsider bone marrow biopsy/aspirate

## 2021-10-04 ENCOUNTER — Inpatient Hospital Stay: Admit: 2021-10-04 | Payer: PRIVATE HEALTH INSURANCE

## 2021-10-04 ENCOUNTER — Ambulatory Visit
Admit: 2021-10-04 | Payer: PRIVATE HEALTH INSURANCE | Attending: Student in an Organized Health Care Education/Training Program

## 2021-10-11 ENCOUNTER — Other Ambulatory Visit: Payer: Self-pay

## 2021-10-11 ENCOUNTER — Inpatient Hospital Stay
Admission: EM | Admit: 2021-10-11 | Discharge: 2021-10-19 | DRG: 037 | Disposition: A | Discharge status: Skilled Nursing Facility | Payer: Medicare Other | Attending: Internal Medicine | Admitting: Internal Medicine

## 2021-10-11 ENCOUNTER — Emergency Department: Payer: Medicare Other

## 2021-10-11 ENCOUNTER — Encounter: Payer: Self-pay | Admitting: Emergency Medicine

## 2021-10-11 DIAGNOSIS — Z602 Problems related to living alone: Secondary | ICD-10-CM | POA: Diagnosis present

## 2021-10-11 DIAGNOSIS — G7001 Myasthenia gravis with (acute) exacerbation: Principal | ICD-10-CM | POA: Diagnosis present

## 2021-10-11 DIAGNOSIS — N186 End stage renal disease: Secondary | ICD-10-CM | POA: Diagnosis present

## 2021-10-11 DIAGNOSIS — I2510 Atherosclerotic heart disease of native coronary artery without angina pectoris: Secondary | ICD-10-CM | POA: Diagnosis present

## 2021-10-11 DIAGNOSIS — D62 Acute posthemorrhagic anemia: Secondary | ICD-10-CM | POA: Diagnosis not present

## 2021-10-11 DIAGNOSIS — Y828 Other medical devices associated with adverse incidents: Secondary | ICD-10-CM | POA: Diagnosis not present

## 2021-10-11 DIAGNOSIS — Z9861 Coronary angioplasty status: Secondary | ICD-10-CM

## 2021-10-11 DIAGNOSIS — I132 Hypertensive heart and chronic kidney disease with heart failure and with stage 5 chronic kidney disease, or end stage renal disease: Secondary | ICD-10-CM | POA: Diagnosis present

## 2021-10-11 DIAGNOSIS — R262 Difficulty in walking, not elsewhere classified: Secondary | ICD-10-CM | POA: Diagnosis present

## 2021-10-11 DIAGNOSIS — N2581 Secondary hyperparathyroidism of renal origin: Secondary | ICD-10-CM | POA: Diagnosis present

## 2021-10-11 DIAGNOSIS — Z888 Allergy status to other drugs, medicaments and biological substances status: Secondary | ICD-10-CM

## 2021-10-11 DIAGNOSIS — E876 Hypokalemia: Secondary | ICD-10-CM | POA: Diagnosis not present

## 2021-10-11 DIAGNOSIS — Z881 Allergy status to other antibiotic agents status: Secondary | ICD-10-CM

## 2021-10-11 DIAGNOSIS — Z794 Long term (current) use of insulin: Secondary | ICD-10-CM

## 2021-10-11 DIAGNOSIS — E440 Moderate protein-calorie malnutrition: Secondary | ICD-10-CM | POA: Diagnosis present

## 2021-10-11 DIAGNOSIS — Z7952 Long term (current) use of systemic steroids: Secondary | ICD-10-CM

## 2021-10-11 DIAGNOSIS — Z20822 Contact with and (suspected) exposure to covid-19: Secondary | ICD-10-CM | POA: Diagnosis present

## 2021-10-11 DIAGNOSIS — Z79899 Other long term (current) drug therapy: Secondary | ICD-10-CM

## 2021-10-11 DIAGNOSIS — E1122 Type 2 diabetes mellitus with diabetic chronic kidney disease: Secondary | ICD-10-CM | POA: Diagnosis present

## 2021-10-11 DIAGNOSIS — M1990 Unspecified osteoarthritis, unspecified site: Secondary | ICD-10-CM | POA: Diagnosis present

## 2021-10-11 DIAGNOSIS — Z8249 Family history of ischemic heart disease and other diseases of the circulatory system: Secondary | ICD-10-CM

## 2021-10-11 DIAGNOSIS — T82590A Other mechanical complication of surgically created arteriovenous fistula, initial encounter: Secondary | ICD-10-CM | POA: Diagnosis not present

## 2021-10-11 DIAGNOSIS — I5022 Chronic systolic (congestive) heart failure: Secondary | ICD-10-CM | POA: Diagnosis present

## 2021-10-11 DIAGNOSIS — E11649 Type 2 diabetes mellitus with hypoglycemia without coma: Secondary | ICD-10-CM | POA: Diagnosis not present

## 2021-10-11 DIAGNOSIS — R222 Localized swelling, mass and lump, trunk: Secondary | ICD-10-CM | POA: Diagnosis not present

## 2021-10-11 DIAGNOSIS — D631 Anemia in chronic kidney disease: Secondary | ICD-10-CM | POA: Diagnosis present

## 2021-10-11 DIAGNOSIS — F331 Major depressive disorder, recurrent, moderate: Secondary | ICD-10-CM | POA: Diagnosis present

## 2021-10-11 DIAGNOSIS — F410 Panic disorder [episodic paroxysmal anxiety] without agoraphobia: Secondary | ICD-10-CM | POA: Diagnosis present

## 2021-10-11 DIAGNOSIS — Z7901 Long term (current) use of anticoagulants: Secondary | ICD-10-CM

## 2021-10-11 DIAGNOSIS — Z681 Body mass index (BMI) 19 or less, adult: Secondary | ICD-10-CM | POA: Diagnosis not present

## 2021-10-11 DIAGNOSIS — T45515A Adverse effect of anticoagulants, initial encounter: Secondary | ICD-10-CM | POA: Diagnosis not present

## 2021-10-11 DIAGNOSIS — E785 Hyperlipidemia, unspecified: Secondary | ICD-10-CM

## 2021-10-11 DIAGNOSIS — I5023 Acute on chronic systolic (congestive) heart failure: Secondary | ICD-10-CM

## 2021-10-11 DIAGNOSIS — Z95828 Presence of other vascular implants and grafts: Secondary | ICD-10-CM

## 2021-10-11 DIAGNOSIS — Y841 Kidney dialysis as the cause of abnormal reaction of the patient, or of later complication, without mention of misadventure at the time of the procedure: Secondary | ICD-10-CM | POA: Diagnosis not present

## 2021-10-11 DIAGNOSIS — T82838A Hemorrhage of vascular prosthetic devices, implants and grafts, initial encounter: Secondary | ICD-10-CM | POA: Diagnosis not present

## 2021-10-11 DIAGNOSIS — R54 Age-related physical debility: Secondary | ICD-10-CM | POA: Diagnosis present

## 2021-10-11 DIAGNOSIS — Z992 Dependence on renal dialysis: Secondary | ICD-10-CM

## 2021-10-11 DIAGNOSIS — I10 Essential (primary) hypertension: Secondary | ICD-10-CM

## 2021-10-11 DIAGNOSIS — M79601 Pain in right arm: Secondary | ICD-10-CM | POA: Insufficient documentation

## 2021-10-11 DIAGNOSIS — E042 Nontoxic multinodular goiter: Secondary | ICD-10-CM | POA: Diagnosis present

## 2021-10-11 DIAGNOSIS — D6832 Hemorrhagic disorder due to extrinsic circulating anticoagulants: Secondary | ICD-10-CM | POA: Diagnosis not present

## 2021-10-11 DIAGNOSIS — T82858A Stenosis of vascular prosthetic devices, implants and grafts, initial encounter: Secondary | ICD-10-CM | POA: Diagnosis not present

## 2021-10-11 DIAGNOSIS — Z66 Do not resuscitate: Secondary | ICD-10-CM | POA: Diagnosis not present

## 2021-10-11 DIAGNOSIS — Z86718 Personal history of other venous thrombosis and embolism: Secondary | ICD-10-CM

## 2021-10-11 DIAGNOSIS — R1310 Dysphagia, unspecified: Secondary | ICD-10-CM | POA: Diagnosis present

## 2021-10-11 HISTORY — DX: Type 2 diabetes mellitus without complications: E11.9

## 2021-10-11 HISTORY — DX: Essential (primary) hypertension: I10

## 2021-10-11 HISTORY — DX: Disorder of kidney and ureter, unspecified: N28.9

## 2021-10-11 HISTORY — DX: Encounter for other specified aftercare: Z51.89

## 2021-10-11 HISTORY — DX: Heart failure, unspecified: I50.9

## 2021-10-11 LAB — BASIC METABOLIC PANEL
Anion gap: 15 (ref 5–15)
BUN: 41 mg/dL — ABNORMAL HIGH (ref 8–23)
CO2: 24 mmol/L (ref 22–32)
Calcium: 8.2 mg/dL — ABNORMAL LOW (ref 8.9–10.3)
Chloride: 100 mmol/L (ref 98–111)
Creatinine, Ser: 4.91 mg/dL — ABNORMAL HIGH (ref 0.44–1.00)
GFR, Estimated: 9 mL/min — ABNORMAL LOW (ref >60)
Glucose, Bld: 108 mg/dL — ABNORMAL HIGH (ref 70–99)
Potassium: 4.7 mmol/L (ref 3.5–5.1)
Sodium: 139 mmol/L (ref 135–145)

## 2021-10-11 LAB — CBC
HCT: 26.4 % — ABNORMAL LOW (ref 36.0–46.0)
Hemoglobin: 8.3 g/dL — ABNORMAL LOW (ref 12.0–15.0)
MCH: 31.1 pg (ref 26.0–34.0)
MCHC: 31.4 g/dL (ref 30.0–36.0)
MCV: 98.9 fL (ref 80.0–100.0)
Platelets: 86 10*3/uL — ABNORMAL LOW (ref 150–400)
RBC: 2.67 MIL/uL — ABNORMAL LOW (ref 3.87–5.11)
RDW: 17.2 % — ABNORMAL HIGH (ref 11.5–15.5)
WBC: 5.6 10*3/uL (ref 4.0–10.5)
nRBC: 1.8 % — ABNORMAL HIGH (ref 0.0–0.2)

## 2021-10-11 LAB — RESP PANEL BY RT-PCR (FLU A&B, COVID) ARPGX2
Influenza A by PCR: NEGATIVE
Influenza B by PCR: NEGATIVE
SARS Coronavirus 2 by RT PCR: NEGATIVE

## 2021-10-11 LAB — CK: Total CK: 93 U/L (ref 38–234)

## 2021-10-11 LAB — CBG MONITORING, ED: Glucose-Capillary: 119 mg/dL — ABNORMAL HIGH (ref 70–99)

## 2021-10-11 MED ORDER — SODIUM CHLORIDE 0.9 % IV BOLUS
1000.0000 mL | Freq: Once | INTRAVENOUS | Status: AC
  Administered 2021-10-11: 1000 mL via INTRAVENOUS

## 2021-10-11 NOTE — ED Triage Notes (Signed)
Pt in via [facility] from home, complaints of worsening shortness of breath, generalized weakness with difficulty ambulating over the last few months.  Family reports just going to [place] to bring her here because she was having recurrent hospitalizations with no family there to help manage her care.   ? ?Patient is on dialysis, schedule M, W, F; reports she has not missed any treatments. ? ?Patient is A/Ox3, vitals WDL at this time.   ?

## 2021-10-11 NOTE — ED Provider Notes (Signed)
? ?[facility] ?Provider Note ? ? ? Event Date/Time  ? First MD Initiated Contact with Patient 10/11/21 2322   ?  (approximate) ? ? ?History  ? ?Shortness of Breath and Weakness ? ? ?HPI ? ?Tammie Klein is a 71 y.o. female with a history of myasthenia gravis who reports gradually worsening bilateral lower extremity weakness over the past several days.  She has had deteriorating health over the last several months, and was recently brought to the area from her home in [place] by her family so that she could be cared for by family here.  She is on hemodialysis, normally on a Monday Wednesday Friday schedule and has been compliant.  Denies chest pain but does endorse shortness of breath.  No fever or cough. ?  ? ? ?Physical Exam  ? ?Triage Vital Signs: ?ED Triage Vitals  ?Enc Vitals Group  ?   BP 10/11/21 1814 (!) 114/58  ?   Pulse Rate 10/11/21 1814 90  ?   Resp 10/11/21 1814 16  ?   Temp 10/11/21 1814 98.6 ?F (37 ?C)  ?   Temp Source 10/11/21 1814 Oral  ?   SpO2 10/11/21 1814 99 %  ?   Weight 10/11/21 1823 120 lb (54.4 kg)  ?   Height 10/11/21 1823 5\' 3"  (1.6 m)  ?   Head Circumference --   ?   Peak Flow --   ?   Pain Score 10/11/21 1823 0  ?   Pain Loc --   ?   Pain Edu? --   ?   Excl. in [place]? --   ? ? ?Most recent vital signs: ?Vitals:  ? 10/11/21 2200 10/11/21 2300  ?BP: 132/72 136/66  ?Pulse: 87 81  ?Resp: (!) 21 18  ?Temp:    ?SpO2: 100% 100%  ? ? ? ?General: Awake, no distress.  ?CV:  Good peripheral perfusion.  Regular rate rhythm ?Resp:  Normal effort.  Clear to auscultation bilaterally ?Abd:  No distention.  Soft nontender ?Other:  No rash.  Normal mental status. ? ? ?ED Results / Procedures / Treatments  ? ?Labs ?(all labs ordered are listed, but only abnormal results are displayed) ?Labs Reviewed  ?CBC - Abnormal; Notable for the following components:  ?    Result Value  ? RBC 2.67 (*)   ? Hemoglobin 8.3 (*)   ? HCT 26.4 (*)   ? RDW 17.2 (*)   ? Platelets 86 (*)   ? nRBC 1.8 (*)    ? All other components within normal limits  ?BASIC METABOLIC PANEL - Abnormal; Notable for the following components:  ? Glucose, Bld 108 (*)   ? BUN 41 (*)   ? Creatinine, Ser 4.91 (*)   ? Calcium 8.2 (*)   ? GFR, Estimated 9 (*)   ? All other components within normal limits  ?CBG MONITORING, ED - Abnormal; Notable for the following components:  ? Glucose-Capillary 119 (*)   ? All other components within normal limits  ?RESP PANEL BY RT-PCR (FLU A&B, COVID) ARPGX2  ?CK  ?URINALYSIS, ROUTINE W REFLEX MICROSCOPIC  ?CBG MONITORING, ED  ? ? ? ?EKG ? ?Interpreted by me ?Normal sinus rhythm rate of 90.  Normal axis and intervals.  Normal QRS ST segments and T waves. ? ? ?RADIOLOGY ?Chest x-ray viewed and interpreted by me, appears normal.  Radiology report reviewed. ? ? ? ?PROCEDURES: ? ?Critical Care performed: No ? ?Procedures ? ? ?MEDICATIONS ORDERED IN ED: ?  Medications  ?sodium chloride 0.9 % bolus 1,000 mL (0 mLs Intravenous Stopped 10/11/21 2147)  ? ? ? ?IMPRESSION / MDM / ASSESSMENT AND PLAN / ED COURSE  ?I reviewed the triage vital signs and the nursing notes. ?             ?               ? ?Differential diagnosis includes, but is not limited to, pneumonia, UTI, myasthenia gravis exacerbation, dehydration ? ?Patient presents with shortness of breath and large muscle group weakness, concerning for myasthenia exacerbation. ?Respiratory parameters show adequate tidal volume.  Handling secretions fine. ?Forced vital capacity of 1120 mL is somewhat less than patient's weight base expected normal FVC of 1350 mL (25 mL/kg). ?With -25 as above respiratory support threshold of less than -20 ? ?Case discussed with neurology Dr. Leonel Ramsay who recommends continuing medications at current doses pending neurology consult in the morning.  Continue RT respiratory parameters every 6 hours.  He recommends avoiding steroid bolus for now. ? ?  ? ? ?FINAL CLINICAL IMPRESSION(S) / ED DIAGNOSES  ? ?Final diagnoses:  ?Myasthenia  gravis with acute exacerbation ([place])  ? ? ? ?Rx / DC Orders  ? ?ED Discharge Orders   ? ? None  ? ?  ? ? ? ?Note:  This document was prepared using Dragon voice recognition software and may include unintentional dictation errors. ?  ?Carrie Mew, MD ?10/11/21 2356 ? ?

## 2021-10-11 NOTE — ED Notes (Signed)
First nurse note-pt brought in via ems from home with sob and swelling to legs.  Dialysis pt, had dialysis yesterday  hx chf.  Pt just moved here this past weekend.  Per ems vs, 134/69. P-85, oxygen sats 100% room air.  Fsbs 124 per ems.   ?

## 2021-10-11 NOTE — Progress Notes (Signed)
Patient seen for FVC/NIF. States was diagnosed in her 58's with myasthenia. Patient positioned upright in bed. Room air saturation noted at 100. HR 92 RR 22. Patient only able to perform FVC x2. 1.12 (L) best of the two. NIF -25. Tolerated procedure well.  ?

## 2021-10-11 NOTE — ED Notes (Signed)
Family updated by phone of patient's admission. ?

## 2021-10-12 DIAGNOSIS — E785 Hyperlipidemia, unspecified: Secondary | ICD-10-CM

## 2021-10-12 DIAGNOSIS — G7001 Myasthenia gravis with (acute) exacerbation: Secondary | ICD-10-CM | POA: Diagnosis not present

## 2021-10-12 DIAGNOSIS — I5023 Acute on chronic systolic (congestive) heart failure: Secondary | ICD-10-CM

## 2021-10-12 DIAGNOSIS — Z992 Dependence on renal dialysis: Secondary | ICD-10-CM

## 2021-10-12 DIAGNOSIS — N186 End stage renal disease: Secondary | ICD-10-CM

## 2021-10-12 DIAGNOSIS — I5022 Chronic systolic (congestive) heart failure: Secondary | ICD-10-CM

## 2021-10-12 DIAGNOSIS — I10 Essential (primary) hypertension: Secondary | ICD-10-CM

## 2021-10-12 LAB — PROTIME-INR
INR: 2.1 — ABNORMAL HIGH (ref 0.8–1.2)
Prothrombin Time: 23.7 seconds — ABNORMAL HIGH (ref 11.4–15.2)

## 2021-10-12 LAB — CBC
HCT: 24.2 % — ABNORMAL LOW (ref 36.0–46.0)
HCT: 23.3 % — ABNORMAL LOW (ref 36.0–46.0)
Hemoglobin: 7.4 g/dL — ABNORMAL LOW (ref 12.0–15.0)
Hemoglobin: 7.6 g/dL — ABNORMAL LOW (ref 12.0–15.0)
MCH: 30.6 pg (ref 26.0–34.0)
MCH: 31.4 pg (ref 26.0–34.0)
MCHC: 30.6 g/dL (ref 30.0–36.0)
MCHC: 32.6 g/dL (ref 30.0–36.0)
MCV: 100 fL (ref 80.0–100.0)
MCV: 96.3 fL (ref 80.0–100.0)
Platelets: 79 10*3/uL — ABNORMAL LOW (ref 150–400)
Platelets: 68 10*3/uL — ABNORMAL LOW (ref 150–400)
RBC: 2.42 MIL/uL — ABNORMAL LOW (ref 3.87–5.11)
RBC: 2.42 MIL/uL — ABNORMAL LOW (ref 3.87–5.11)
RDW: 17.3 % — ABNORMAL HIGH (ref 11.5–15.5)
RDW: 17.2 % — ABNORMAL HIGH (ref 11.5–15.5)
WBC: 5.5 10*3/uL (ref 4.0–10.5)
WBC: 5.5 10*3/uL (ref 4.0–10.5)
nRBC: 3.1 % — ABNORMAL HIGH (ref 0.0–0.2)
nRBC: 2.9 % — ABNORMAL HIGH (ref 0.0–0.2)

## 2021-10-12 LAB — BASIC METABOLIC PANEL
Anion gap: 15 (ref 5–15)
BUN: 45 mg/dL — ABNORMAL HIGH (ref 8–23)
CO2: 22 mmol/L (ref 22–32)
Calcium: 7.9 mg/dL — ABNORMAL LOW (ref 8.9–10.3)
Chloride: 104 mmol/L (ref 98–111)
Creatinine, Ser: 4.9 mg/dL — ABNORMAL HIGH (ref 0.44–1.00)
GFR, Estimated: 9 mL/min — ABNORMAL LOW (ref >60)
Glucose, Bld: 86 mg/dL (ref 70–99)
Potassium: 4.4 mmol/L (ref 3.5–5.1)
Sodium: 141 mmol/L (ref 135–145)

## 2021-10-12 LAB — RENAL FUNCTION PANEL
Albumin: 3.2 g/dL — ABNORMAL LOW (ref 3.5–5.0)
Anion gap: 17 — ABNORMAL HIGH (ref 5–15)
BUN: 50 mg/dL — ABNORMAL HIGH (ref 8–23)
CO2: 21 mmol/L — ABNORMAL LOW (ref 22–32)
Calcium: 8 mg/dL — ABNORMAL LOW (ref 8.9–10.3)
Chloride: 102 mmol/L (ref 98–111)
Creatinine, Ser: 5.28 mg/dL — ABNORMAL HIGH (ref 0.44–1.00)
GFR, Estimated: 8 mL/min — ABNORMAL LOW (ref >60)
Glucose, Bld: 102 mg/dL — ABNORMAL HIGH (ref 70–99)
Phosphorus: 4.6 mg/dL (ref 2.5–4.6)
Potassium: 4.7 mmol/L (ref 3.5–5.1)
Sodium: 140 mmol/L (ref 135–145)

## 2021-10-12 LAB — HEMOGLOBIN A1C
Hgb A1c MFr Bld: 6 % — ABNORMAL HIGH (ref 4.8–5.6)
Mean Plasma Glucose: 125.5 mg/dL

## 2021-10-12 LAB — HEPATITIS B SURFACE ANTIGEN: Hepatitis B Surface Ag: NONREACTIVE

## 2021-10-12 LAB — GLUCOSE, CAPILLARY
Glucose-Capillary: 64 mg/dL — ABNORMAL LOW (ref 70–99)
Glucose-Capillary: 80 mg/dL (ref 70–99)

## 2021-10-12 LAB — HEPATITIS B SURFACE ANTIBODY,QUALITATIVE: Hep B S Ab: REACTIVE — AB

## 2021-10-12 MED ORDER — MEGESTROL ACETATE 400 MG/10ML PO SUSP
625.0000 mg | Freq: Every day | ORAL | Status: DC
  Administered 2021-10-13 – 2021-10-17 (×3): 625 mg via ORAL
  Filled 2021-10-12 (×6): qty 20

## 2021-10-12 MED ORDER — HEPARIN SODIUM (PORCINE) 1000 UNIT/ML DIALYSIS: 1000.0000 [IU] | INTRAMUSCULAR | Status: DC | PRN

## 2021-10-12 MED ORDER — LIDOCAINE-PRILOCAINE 2.5-2.5 % EX CREA: 1.0000 "application " | TOPICAL_CREAM | CUTANEOUS | Status: DC | PRN

## 2021-10-12 MED ORDER — CHLORHEXIDINE GLUCONATE CLOTH 2 % EX PADS
6.0000 | MEDICATED_PAD | Freq: Every day | CUTANEOUS | Status: DC
  Administered 2021-10-13 – 2021-10-17 (×5): 6 via TOPICAL

## 2021-10-12 MED ORDER — LIDOCAINE 5 % EX PTCH
1.0000 | MEDICATED_PATCH | CUTANEOUS | Status: DC
  Filled 2021-10-12 (×6): qty 1

## 2021-10-12 MED ORDER — NITROGLYCERIN 0.4 MG SL SUBL: 0.4000 mg | SUBLINGUAL_TABLET | SUBLINGUAL | Status: DC | PRN

## 2021-10-12 MED ORDER — WARFARIN SODIUM 1 MG PO TABS
1.0000 mg | ORAL_TABLET | Freq: Every day | ORAL | Status: DC
  Filled 2021-10-12 (×2): qty 1

## 2021-10-12 MED ORDER — SACUBITRIL-VALSARTAN 24-26 MG PO TABS
1.0000 | ORAL_TABLET | Freq: Two times a day (BID) | ORAL | Status: DC
  Administered 2021-10-12 – 2021-10-19 (×13): 1 via ORAL
  Filled 2021-10-12 (×15): qty 1

## 2021-10-12 MED ORDER — PREDNISONE 10 MG PO TABS
5.0000 mg | ORAL_TABLET | Freq: Every day | ORAL | Status: DC
  Administered 2021-10-12 – 2021-10-19 (×7): 5 mg via ORAL
  Filled 2021-10-12 (×7): qty 1

## 2021-10-12 MED ORDER — WARFARIN - PHARMACIST DOSING INPATIENT
Freq: Every day | Status: DC
  Filled 2021-10-12: qty 1

## 2021-10-12 MED ORDER — LATANOPROST 0.005 % OP SOLN
1.0000 [drp] | Freq: Every day | OPHTHALMIC | Status: DC
  Administered 2021-10-12 – 2021-10-18 (×7): 1 [drp] via OPHTHALMIC
  Filled 2021-10-12: qty 2.5

## 2021-10-12 MED ORDER — ONDANSETRON HCL 4 MG/2ML IJ SOLN
4.0000 mg | Freq: Four times a day (QID) | INTRAMUSCULAR | Status: DC | PRN
  Administered 2021-10-18: 4 mg via INTRAVENOUS
  Filled 2021-10-12: qty 2

## 2021-10-12 MED ORDER — LIDOCAINE HCL (PF) 1 % IJ SOLN
5.0000 mL | INTRAMUSCULAR | Status: DC | PRN
  Filled 2021-10-12: qty 5

## 2021-10-12 MED ORDER — MELATONIN 5 MG PO TABS
2.5000 mg | ORAL_TABLET | Freq: Every day | ORAL | Status: DC
  Administered 2021-10-12 – 2021-10-18 (×4): 2.5 mg via ORAL
  Filled 2021-10-12 (×8): qty 1

## 2021-10-12 MED ORDER — TRAZODONE HCL 50 MG PO TABS: 25.0000 mg | ORAL_TABLET | Freq: Every evening | ORAL | Status: DC | PRN

## 2021-10-12 MED ORDER — ISOSORB DINITRATE-HYDRALAZINE 20-37.5 MG PO TABS
1.0000 | ORAL_TABLET | Freq: Two times a day (BID) | ORAL | Status: DC
  Administered 2021-10-12 – 2021-10-19 (×11): 1 via ORAL
  Filled 2021-10-12 (×16): qty 1

## 2021-10-12 MED ORDER — ACETAMINOPHEN 325 MG PO TABS: 650.0000 mg | ORAL_TABLET | Freq: Four times a day (QID) | ORAL | Status: DC | PRN

## 2021-10-12 MED ORDER — MIRTAZAPINE 15 MG PO TABS
7.5000 mg | ORAL_TABLET | Freq: Every day | ORAL | Status: DC
  Administered 2021-10-12 – 2021-10-18 (×7): 7.5 mg via ORAL
  Filled 2021-10-12 (×8): qty 1

## 2021-10-12 MED ORDER — FUROSEMIDE 40 MG PO TABS
80.0000 mg | ORAL_TABLET | Freq: Two times a day (BID) | ORAL | Status: DC
  Administered 2021-10-13 – 2021-10-19 (×10): 80 mg via ORAL
  Filled 2021-10-12 (×13): qty 2

## 2021-10-12 MED ORDER — SODIUM CHLORIDE 0.9 % IV SOLN: 100.0000 mL | INTRAVENOUS | Status: DC | PRN

## 2021-10-12 MED ORDER — CARVEDILOL 25 MG PO TABS: 25.0000 mg | ORAL_TABLET | Freq: Two times a day (BID) | ORAL | Status: DC

## 2021-10-12 MED ORDER — ATORVASTATIN CALCIUM 20 MG PO TABS
20.0000 mg | ORAL_TABLET | Freq: Every day | ORAL | Status: DC
  Filled 2021-10-12: qty 1

## 2021-10-12 MED ORDER — WARFARIN SODIUM 1 MG PO TABS: 1.0000 mg | ORAL_TABLET | Freq: Every day | ORAL | Status: DC

## 2021-10-12 MED ORDER — SENNOSIDES-DOCUSATE SODIUM 8.6-50 MG PO TABS
1.0000 | ORAL_TABLET | Freq: Every day | ORAL | Status: DC
  Filled 2021-10-12 (×2): qty 1

## 2021-10-12 MED ORDER — INSULIN ASPART 100 UNIT/ML IJ SOLN
0.0000 [IU] | Freq: Three times a day (TID) | INTRAMUSCULAR | Status: DC
  Administered 2021-10-13 – 2021-10-14 (×2): 1 [IU] via SUBCUTANEOUS
  Administered 2021-10-15: 7 [IU] via SUBCUTANEOUS
  Administered 2021-10-17: 2 [IU] via SUBCUTANEOUS
  Administered 2021-10-18: 3 [IU] via SUBCUTANEOUS
  Filled 2021-10-12: qty 0.09
  Filled 2021-10-12 (×6): qty 1

## 2021-10-12 MED ORDER — ONDANSETRON HCL 4 MG PO TABS: 8.0000 mg | ORAL_TABLET | Freq: Three times a day (TID) | ORAL | Status: DC | PRN

## 2021-10-12 MED ORDER — PENTOXIFYLLINE ER 400 MG PO TBCR
400.0000 mg | EXTENDED_RELEASE_TABLET | Freq: Every day | ORAL | Status: DC
  Administered 2021-10-13 – 2021-10-19 (×6): 400 mg via ORAL
  Filled 2021-10-12 (×8): qty 1

## 2021-10-12 MED ORDER — FAMOTIDINE 20 MG PO TABS
20.0000 mg | ORAL_TABLET | Freq: Every day | ORAL | Status: DC
  Administered 2021-10-13 – 2021-10-19 (×6): 20 mg via ORAL
  Filled 2021-10-12 (×7): qty 1

## 2021-10-12 MED ORDER — ALTEPLASE 2 MG IJ SOLR
2.0000 mg | Freq: Once | INTRAMUSCULAR | Status: DC | PRN
Start: 2021-10-12 — End: 2021-10-12

## 2021-10-12 MED ORDER — ONDANSETRON HCL 4 MG PO TABS: 4.0000 mg | ORAL_TABLET | Freq: Four times a day (QID) | ORAL | Status: DC | PRN

## 2021-10-12 MED ORDER — ENSURE ENLIVE PO LIQD
237.0000 mL | Freq: Two times a day (BID) | ORAL | Status: DC
  Administered 2021-10-13: 237 mL via ORAL

## 2021-10-12 MED ORDER — INSULIN GLARGINE-YFGN 100 UNIT/ML ~~LOC~~ SOLN
Freq: Every day | SUBCUTANEOUS | Status: DC
  Filled 2021-10-12: qty 4

## 2021-10-12 MED ORDER — ACETAMINOPHEN 650 MG RE SUPP: 650.0000 mg | Freq: Four times a day (QID) | RECTAL | Status: DC | PRN

## 2021-10-12 MED ORDER — PENTAFLUOROPROP-TETRAFLUOROETH EX AERO: 1.0000 "application " | INHALATION_SPRAY | CUTANEOUS | Status: DC | PRN

## 2021-10-12 MED ORDER — INSULIN ASPART 100 UNIT/ML IJ SOLN
0.0000 [IU] | Freq: Every day | INTRAMUSCULAR | Status: DC
  Filled 2021-10-12: qty 0.05

## 2021-10-12 MED ORDER — INSULIN GLARGINE-YFGN 100 UNIT/ML ~~LOC~~ SOLN
4.0000 [IU] | Freq: Every day | SUBCUTANEOUS | Status: DC
  Administered 2021-10-14 – 2021-10-15 (×2): 4 [IU] via SUBCUTANEOUS
  Filled 2021-10-12 (×5): qty 0.04

## 2021-10-12 MED ORDER — ENOXAPARIN SODIUM 40 MG/0.4ML IJ SOSY: 40.0000 mg | PREFILLED_SYRINGE | INTRAMUSCULAR | Status: DC

## 2021-10-12 MED ORDER — SODIUM CHLORIDE 0.9 % IV SOLN: INTRAVENOUS | Status: DC

## 2021-10-12 MED ORDER — MAGNESIUM HYDROXIDE 400 MG/5ML PO SUSP: 30.0000 mL | Freq: Every day | ORAL | Status: DC | PRN

## 2021-10-12 NOTE — Progress Notes (Addendum)
PT Cancellation Note ? ?Patient Details ?Name: Tammie Klein ?MRN: [id number] ?DOB: May 10, 1951 ? ? ?Cancelled Treatment:    Reason Eval/Treat Not Completed: Other (comment).  Pt currently still off floor at dialysis.  Will re-attempt PT treatment session at a later date/time as able. ? ?Leitha Bleak, PT ?10/12/21, 3:28 PM ? ?Pt still off floor at dialysis.  Will re-attempt PT treatment session at a later date/time as able. ? ?Leitha Bleak, PT ?10/12/21, 4:23 PM ? ? ?

## 2021-10-12 NOTE — ED Notes (Signed)
Consent signed for dialysis treatment and placed with pt's chart. ?

## 2021-10-12 NOTE — Consult Note (Signed)
?                    NEURO HOSPITALIST CONSULT NOTE  ? ?Requestig physician: Dr. Sloan Leiter ? ?Reason for Consult: Possible myasthenia gravis exacerbation ? ?History obtained from:  Patient, Family and Chart    ? ?HPI:                                                                                                                                         ? Tammie Klein is an 71 y.o. female with a PMHx of ESRD on HD, anemia of renal disease, DM, CHF, HTN and myasthenia gravis (on maintenance prednisone 5 mg qd as outpatient) who presented to the ED on Thursday evening with generalized weakness, worsening SOB for the past 2 months, worsened BLE weakness x 3 weeks and swelling to her legs. She was also complaining of difficulty ambulating over the past few months. Family had just brought her down to [place] from [place] due to her having recurrent hospitalizations with no family there to help manage her care.   ? ?Respiratory therapy assessed the patient in the ED and noted room air saturations of 100%, RR 22, FVC 1120 mL and NIF -25. Today, her FVC improved slightly to 1200 mL, but NIF worsened to -13.  ? ?Neurology was consulted to assess for possible myasthenia gravis exacerbation. ? ?Home medications include prednisone 5 mg three times a week as chronic immunosuppressive therapy for her myasthenia gravis. Also on warfarin for history of blood clots.  ? ?She states that her breathing gets worse with lying down and better with sitting, but also endorses weakness including droopy eyelids that progressively worsens during the day. She states that the muscles typically affected with her MG are her legs, arms and eyelids.  ? ?Past Medical History:  ?Diagnosis Date  ? Blood transfusion without reported diagnosis   ? CHF (congestive heart failure) ([place])   ? Diabetes mellitus without complication ([name])   ? Hypertension   ? Renal disorder   ? ? ?Past Surgical History:  ?Procedure Laterality Date  ? ABDOMINAL HYSTERECTOMY    ?  CARDIAC CATHETERIZATION  2023  ? Thymus Gland Removal    ? ? ?No family history on file.           ? ?Social History:  reports that she has never smoked. She has never used smokeless tobacco. She reports that she does not drink alcohol and does not use drugs. ? ?Allergies  ?Allergen Reactions  ? Ciprofloxacin Other (See Comments) and Nausea And Vomiting  ?  Muscle aches ?Muscle aches ?Muscle aches ?  ? Buprenorphine Hcl Nausea And Vomiting  ? Solifenacin Itching and Other (See Comments)  ?  Other reaction(s): Other ?Other reaction(s): Other ?Other reaction(s): Other ?  ? ? ?MEDICATIONS:                                                                                                                     ?  Prior to Admission:  ?Medications Prior to Admission  ?Medication Sig Dispense Refill Last Dose  ? atorvastatin (LIPITOR) 20 MG tablet Take 20 mg by mouth daily.   10/11/2021  ? bimatoprost (LUMIGAN) 0.01 % SOLN Place 1 drop into both eyes at bedtime.   10/10/2021  ? carvedilol (COREG) 25 MG tablet Take 25 mg by mouth 2 (two) times daily with a meal.   10/11/2021  ? famotidine (PEPCID) 20 MG tablet Take 20 mg by mouth daily.   10/11/2021  ? furosemide (LASIX) 80 MG tablet Take 80 mg by mouth 2 (two) times daily.   10/11/2021  ? insulin degludec (TRESIBA) 200 UNIT/ML FlexTouch Pen Inject 4-6 Units into the skin in the morning.   10/11/2021  ? isosorbide-hydrALAZINE (BIDIL) 20-37.5 MG tablet Take 1 tablet by mouth 2 (two) times daily.   10/11/2021  ? lidocaine (LIDODERM) 5 % Place 1 patch onto the skin daily.   10/11/2021  ? megestrol (MEGACE ES) 625 MG/5ML suspension Take 625 mg by mouth daily.   10/11/2021  ? melatonin 3 MG TABS tablet Take 3 mg by mouth at bedtime.   10/10/2021  ? mirtazapine (REMERON) 7.5 MG tablet Take 7.5 mg by mouth at bedtime.   10/10/2021  ? nitroGLYCERIN (NITROSTAT) 0.4 MG SL tablet Place under the tongue.   prn  ? ondansetron (ZOFRAN) 4 MG tablet Take 8 mg by mouth every 8 (eight) hours as needed.   prn  at prn  ? pentoxifylline (TRENTAL) 400 MG CR tablet Take 400 mg by mouth daily.   10/11/2021  ? predniSONE (DELTASONE) 5 MG tablet Take 5 mg by mouth 3 (three) times a week. On HD Days, Mon-Wed-Fri   10/11/2021  ? sacubitril-valsartan (ENTRESTO) 24-26 MG Take 1 tablet by mouth 2 (two) times daily.   10/11/2021  ? senna-docusate (SENOKOT-S) 8.6-50 MG tablet Take 1 tablet by mouth daily.   10/11/2021  ? warfarin (COUMADIN) 1 MG tablet Take 1 mg by mouth daily at 4 PM.   10/11/2021  ? ?Scheduled: ? Chlorhexidine Gluconate Cloth  6 each Topical Q0600  ? famotidine  20 mg Oral Daily  ? feeding supplement  237 mL Oral BID BM  ? furosemide  80 mg Oral BID  ? insulin aspart  0-5 Units Subcutaneous QHS  ? insulin aspart  0-9 Units Subcutaneous TID WC  ? insulin glargine-yfgn  4 Units Subcutaneous Q breakfast  ? isosorbide-hydrALAZINE  1 tablet Oral BID  ? latanoprost  1 drop Both Eyes QHS  ? lidocaine  1 patch Transdermal Q24H  ? megestrol  625 mg Oral Daily  ? melatonin  2.5 mg Oral QHS  ? mirtazapine  7.5 mg Oral QHS  ? pentoxifylline  400 mg Oral Daily  ? predniSONE  5 mg Oral Q breakfast  ? sacubitril-valsartan  1 tablet Oral BID  ? senna-docusate  1 tablet Oral Daily  ? warfarin  1 mg Oral q1600  ? Warfarin - Pharmacist Dosing Inpatient   Does not apply [id number]  ? ? ? ?ROS:                                                                                                                                       ?  As per HPI.  ? ? ?Blood pressure (!) 143/66, pulse 95, temperature 98.3 ?F (36.8 ?C), temperature source Oral, resp. rate (!) 33, height 5\' 3"  (1.6 m), weight 55.8 kg, SpO2 100 %. ? ? ?General Examination:                                                                                                      ? ?Physical Exam  ?HEENT-  [place]/AT    ?Lungs- Respirations unlabored ?Extremities- Mild edema to BLE distally ? ? ?Neurological Examination ?Mental Status: Alert, oriented x 5, thought content appropriate. Fatigued appearing.  Speech fluent without evidence of aphasia.  Able to follow all commands without difficulty. ?Cranial Nerves: ?II: Temporal visual fields intact with no extinction to DSS. PERRL.   ?III,IV, VI: Mild to moderate bilateral ptosis that is equal when resting but with right sided ptosis worsening after sustained upgaze x 60 seconds. EOMI with ability to fully bury sclerae on horizontal gaze and no fatiguability of extraocular muscles with upgaze, except for the ptosis noted above.  ?V: Temp sensation intact bilaterally ?VII: Smile symmetric, but with "myasthenic snarl" (aka "transverse smile") when asked to smile. ?VIII: Hearing intact to voice ?IX,X: Mild hypophonia in the context of fatigue ?XI: Head is midline ?XII: Midline tongue extension ?Motor: ?BUE: 4-/5 deltoid, 4/5 elbow flexion and extension, 4/5 grip strength ?BLE 2/5 hip flexion, 4/5 knee extension, 4-/5 knee flexion, 4+/5 ADF/APF ?No pronator drift.  ?Sensory: Temp and light touch intact throughout, bilaterally ?Deep Tendon Reflexes: 2+ and symmetric brachioradialis and biceps.  ?Cerebellar: No ataxia with FNF bilaterally  ?Gait: Deferred ?  ?Lab Results: ?Basic Metabolic Panel: ?Recent Labs  ?Lab 10/11/21 ?2034 10/12/21 ?9417 10/12/21 ?1140  ?NA 139 141 140  ?K 4.7 4.4 4.7  ?CL 100 104 102  ?CO2 24 22 21*  ?GLUCOSE 108* 86 102*  ?BUN 41* 45* 50*  ?CREATININE 4.91* 4.90* 5.28*  ?CALCIUM 8.2* 7.9* 8.0*  ?PHOS  --   --  4.6  ? ? ?CBC: ?Recent Labs  ?Lab 10/11/21 ?1848 10/12/21 ?4081 10/12/21 ?1140  ?WBC 5.6 5.5 5.5  ?HGB 8.3* 7.4* 7.6*  ?HCT 26.4* 24.2* 23.3*  ?MCV 98.9 100.0 96.3  ?PLT 86* 79* 68*  ? ? ?Cardiac Enzymes: ?Recent Labs  ?Lab 10/11/21 ?2034  ?CKTOTAL 93  ? ? ?Lipid Panel: ?No results for input(s): CHOL, TRIG, HDL, CHOLHDL, VLDL, LDLCALC in the last 168 hours. ? ?Imaging: ?DG Chest Portable 1 View ? ?Result Date: 10/11/2021 ?CLINICAL DATA:  Dyspnea EXAM: PORTABLE CHEST 1 VIEW COMPARISON:  None. FINDINGS: The lungs are symmetrically expanded. Mild  interstitial thickening is likely chronic in nature. No superimposed focal pulmonary infiltrate. No pneumothorax or pleural effusion. Median sternotomy has been performed. Intra pulmonary pressure monito

## 2021-10-12 NOTE — Progress Notes (Signed)
Hemodialysis Post Treatment Note: ?  ?Treatment date: 10/12/2021 ?  ?Treatment time:  3 hours  ?  ?Access:  Right AVF ?  ?UF Removed: 500 ml ?  ?Next Treatment: 10/15/21 ?  ?Patient started dialysis as ordered.  Patient tolerated well. Patient experience prolong bleeding to arterial and venous site on AVF. Colon Flattery, NP made aware. Hemostat was used to assist in stopping bleeding. Report given to floor nurse Arnoldo Morale, RN. ?

## 2021-10-12 NOTE — Assessment & Plan Note (Signed)
-   Nephrology consult will be obtained to follow-up on HD. ?- I notified Dr. Zollie Scale about the patient. ?

## 2021-10-12 NOTE — ED Notes (Signed)
Pt states on days of dialysis she only takes her Presidone. ?

## 2021-10-12 NOTE — Assessment & Plan Note (Signed)
-   We will continue her Coreg and Entresto as well as bilateral ?

## 2021-10-12 NOTE — ED Notes (Signed)
Pt given breakfast tray

## 2021-10-12 NOTE — ED Notes (Signed)
Admit MD messaged regarding family wishes to discuss DNR and pt's wishes. ?

## 2021-10-12 NOTE — Plan of Care (Signed)
Patient's FVC is 1.2 and NIF is -13 ?

## 2021-10-12 NOTE — Assessment & Plan Note (Signed)
-   We will continue statin therapy. 

## 2021-10-12 NOTE — Progress Notes (Signed)
Patient seen and examined.  She was in the emergency room.  I examined her again when she was working with physical therapy.  Admitted by nighttime hospitalist early morning hours for weakness and shortness of breath. ? ?In brief, 71 year old with history of myasthenia gravis, ESRD on hemodialysis MWF, type 2 diabetes and hypertension recently moved from [place] to [place] to live with her son and daughter-in-law brought to ER with shortness of breath and bilateral lower extremity weakness.  Patient complained of shortness of breath worsening over the last few months.  She has occasional cough but no wheezing.  Extremity weakness is ongoing for many weeks, recently worse to the extent she is not able to walk very well.  In the emergency room hemodynamically stable.  Chest x-ray with mild cardiomegaly.  Given 1 L fluid bolus.  Nephrology and neurology consulted and admitted to the hospital. ? ?Bilateral lower extremity weakness/presumed myasthenia gravis with acute exacerbation: ?Unsure whether her weakness is from myasthenia gravis exacerbation or due to multiple medical issues that she suffers from with chronic debility. ?PT/OT to continue to work.  Patient will need more support system at home.  Physical therapy at home.  DME supplies. ?Neurology to evaluate patient, she will also need outpatient neurology follow-up. ?Neurology to recommend any modification of therapy. ? ?ESRD on hemodialysis, currently stable.  Discontinue IV fluids.  Will need hemodialysis today.  She has just established with outpatient hemodialysis.  Needing neurology evaluation, will dialyze her today. ? ?Total time spent: 35 minutes.  Same-day admit.  No charge visit. will call and update patient's children. ?

## 2021-10-12 NOTE — H&P (Signed)
?  ?  ?[place] ? ? ?PATIENT NAME: Tammie Klein   ? ?MR#:  [id number] ? ?DATE OF BIRTH:  27-Dec-1950 ? ?DATE OF ADMISSION:  10/11/2021 ? ?PRIMARY CARE PHYSICIAN: Pcp, No  ? ?Patient is coming from: Home ? ?REQUESTING/REFERRING PHYSICIAN: Brenton Grills, MD ? ?CHIEF COMPLAINT:  ? ?Chief Complaint  ?Patient presents with  ? Shortness of Breath  ? Weakness  ? ? ?HISTORY OF PRESENT ILLNESS:  ?Tammie Klein is a 71 y.o. Afro-American female with medical history significant for myasthenia gravis, end-stage renal disease on hemodialysis on MWF, CHF, type diabetes mellitus and hypertension, who presented to the ER with acute onset of dyspnea as well as bilateral lower extremity weakness.  She stated that her dyspnea has been worsening over the last couple months.  She denies any cough but has been having occasional wheezing.  No fever or chills.  No nausea or vomiting or abdominal pain.  A bilateral lower extremity weakness and been going on over the last 3 weeks.  She has mild abdominal discomfort all over.  No dysuria, oliguria or hematuria or flank pain.  No urinary or stool incontinence.  She  is from [place] and is currently living with her son and daughter-in-law in this area. ? ?ED Course: When she came to the ER respiratory it was 27 and later 62 with otherwise normal vital signs.  Labs revealed a BUN of 41 and a creatinine of 4.91 calcium of 8.2 and CBC showed anemia with hemoglobin of 8.3 and hematocrit 26.4.  Influenza antigens and COVID-19 PCR came back negative.  Platelets with 86. ? ?Imaging: Portable chest ray showed mild cardiomegaly with no acute cardiopulmonary disease. ?EKG as reviewed by me : Twelve-lead EKG showed normal sinus rhythm with rate of 90 with left axis deviation and minimal voltage criteria for LVH with prolonged QT interval with QTc of 504 MS. ? ?The patient was given 1 L bolus of IV normal saline.  Contact was made with Dr. Leonel Ramsay who recommended continuing her current home  medications until she is seen in consultation in AM.  The patient will be admitted to a PCU bed for further evaluation and management. ?PAST MEDICAL HISTORY:  ? ?Past Medical History:  ?Diagnosis Date  ? Blood transfusion without reported diagnosis   ? CHF (congestive heart failure) ([place])   ? Diabetes mellitus without complication ([place])   ? Hypertension   ? Renal disorder   ? ? ?PAST SURGICAL HISTORY:  ? ?Past Surgical History:  ?Procedure Laterality Date  ? ABDOMINAL HYSTERECTOMY    ? CARDIAC CATHETERIZATION  2023  ? Thymus Gland Removal    ? ? ?SOCIAL HISTORY:  ? ?Social History  ? ?Tobacco Use  ? Smoking status: Never  ? Smokeless tobacco: Never  ?Substance Use Topics  ? Alcohol use: Never  ? ? ?FAMILY HISTORY:  ? Positive for cancer in her mother and hypertension in her father. ?DRUG ALLERGIES:  ? ?Allergies  ?Allergen Reactions  ? Ciprofloxacin Other (See Comments) and Nausea And Vomiting  ?  Muscle aches ?Muscle aches ?Muscle aches ?  ? Buprenorphine Hcl Nausea And Vomiting  ? Solifenacin Itching and Other (See Comments)  ?  Other reaction(s): Other ?Other reaction(s): Other ?Other reaction(s): Other ?  ? ? ?REVIEW OF SYSTEMS:  ? ?ROS ?As per history of present illness. All pertinent systems were reviewed above. Constitutional, HEENT, cardiovascular, respiratory, GI, GU, musculoskeletal, neuro, psychiatric, endocrine, integumentary and hematologic systems were reviewed and are otherwise negative/unremarkable except for  positive findings mentioned above in the HPI. ? ? ?MEDICATIONS AT HOME:  ? ?Prior to Admission medications   ?Medication Sig Start Date End Date Taking? Authorizing Provider  ?atorvastatin (LIPITOR) 20 MG tablet Take 20 mg by mouth daily. 03/02/21  Yes [provider]  ?bimatoprost (LUMIGAN) 0.01 % SOLN Place 1 drop into both eyes at bedtime.   Yes [provider]  ?carvedilol (COREG) 25 MG tablet Take 25 mg by mouth 2 (two) times daily with a meal. 10/21/19  Yes [provider]  ?famotidine (PEPCID) 20 MG tablet Take 20 mg by mouth daily. 09/17/18  Yes [provider]  ?furosemide (LASIX) 80 MG tablet Take 80 mg by mouth 2 (two) times daily. 09/27/20  Yes [provider]  ?insulin degludec (TRESIBA) 200 UNIT/ML FlexTouch Pen Inject 4-6 Units into the skin in the morning. 09/28/18  Yes [provider]  ?isosorbide-hydrALAZINE (BIDIL) 20-37.5 MG tablet Take 1 tablet by mouth 2 (two) times daily. 04/23/21  Yes [provider]  ?lidocaine (LIDODERM) 5 % Place 1 patch onto the skin daily. 05/24/21  Yes [provider]  ?megestrol (MEGACE ES) 625 MG/5ML suspension Take 625 mg by mouth daily. 08/31/21  Yes [provider]  ?melatonin 3 MG TABS tablet Take 3 mg by mouth at bedtime. 09/23/21  Yes [provider]  ?mirtazapine (REMERON) 7.5 MG tablet Take 7.5 mg by mouth at bedtime. 09/23/21  Yes [provider]  ?nitroGLYCERIN (NITROSTAT) 0.4 MG SL tablet Place under the tongue. 03/27/21  Yes [provider]  ?ondansetron (ZOFRAN) 4 MG tablet Take 8 mg by mouth every 8 (eight) hours as needed. 04/16/21 04/16/22 Yes [provider]  ?pentoxifylline (TRENTAL) 400 MG CR tablet Take 400 mg by mouth daily. 12/10/19  Yes [provider]  ?predniSONE (DELTASONE) 5 MG tablet Take 5 mg by mouth daily with breakfast. 08/09/21  Yes [provider]  ?sacubitril-valsartan (ENTRESTO) 24-26 MG Take 1 tablet by mouth 2 (two) times daily. 05/22/21  Yes [provider]  ?senna-docusate (SENOKOT-S) 8.6-50 MG tablet Take 1 tablet by mouth daily. 03/13/21  Yes [provider]  ?warfarin (COUMADIN) 1 MG tablet Take 1 mg by mouth daily at 4 PM. 07/24/21  Yes [provider]  ? ?  ? ?VITAL SIGNS:  ?Blood pressure 128/64, pulse 84, temperature 98.6 ?F (37 ?C), temperature source Oral, resp. rate 18, height 5\' 3"  (1.6 m), weight 54.4 kg, SpO2 100 %. ? ?PHYSICAL EXAMINATION:  ?Physical  Exam ? ?GENERAL:  71 y.o.-year-old African-American female patient lying in the bed with no acute distress.  ?EYES: Pupils equal, round, reactive to light and accommodation. No scleral icterus. Extraocular muscles intact.  ?HEENT: Head atraumatic, normocephalic. Oropharynx and nasopharynx clear.  ?NECK:  Supple, no jugular venous distention. No thyroid enlargement, no tenderness.  ?LUNGS: Normal breath sounds bilaterally, no wheezing, rales,rhonchi or crepitation. No use of accessory muscles of respiration.  ?CARDIOVASCULAR: Regular rate and rhythm, S1, S2 normal. No murmurs, rubs, or gallops.  ?ABDOMEN: Soft, nondistended, nontender. Bowel sounds present. No organomegaly or mass.  ?EXTREMITIES: No pedal edema, cyanosis, or clubbing.  ?NEUROLOGIC: Cranial nerves II through XII are intact. Muscle strength 4/5 in all extremities. Sensation intact. Gait not checked.  ?PSYCHIATRIC: The patient is alert and oriented x 3.  Normal affect and good eye contact. ?SKIN: No obvious rash, lesion, or ulcer.  ? ?LABORATORY PANEL:  ? ?CBC ?Recent Labs  ?Lab 10/11/21 ?1848  ?WBC 5.6  ?HGB 8.3*  ?  HCT 26.4*  ?PLT 86*  ? ?------------------------------------------------------------------------------------------------------------------ ? ?Chemistries  ?Recent Labs  ?Lab 10/11/21 ?2034  ?NA 139  ?K 4.7  ?CL 100  ?CO2 24  ?GLUCOSE 108*  ?BUN 41*  ?CREATININE 4.91*  ?CALCIUM 8.2*  ? ?------------------------------------------------------------------------------------------------------------------ ? ?Cardiac Enzymes ?No results for input(s): TROPONINI in the last 168 hours. ?------------------------------------------------------------------------------------------------------------------ ? ?RADIOLOGY:  ?DG Chest Portable 1 View ? ?Result Date: 10/11/2021 ?CLINICAL DATA:  Dyspnea EXAM: PORTABLE CHEST 1 VIEW COMPARISON:  None. FINDINGS: The lungs are symmetrically expanded. Mild interstitial thickening is likely chronic in nature. No  superimposed focal pulmonary infiltrate. No pneumothorax or pleural effusion. Median sternotomy has been performed. Intra pulmonary pressure monitoring device noted within the left perihilar region. Mild cardiomegaly is present.

## 2021-10-12 NOTE — ED Notes (Signed)
Pt went to dialysis.

## 2021-10-12 NOTE — ED Notes (Signed)
Pt back from Dialysis. Pt had saturated bloody gown and sheets. Pt cleaned up and change sheets, pt placed in clean gown and repositioned in bed at this time. ?

## 2021-10-12 NOTE — Evaluation (Signed)
Physical Therapy Evaluation ?Patient Details ?Name: Tammie Klein ?MRN: [id number] ?DOB: June 12, 1951 ?Today's Date: 10/12/2021 ? ?History of Present Illness ? Pt is a 71 y.o. female presenting to hospital 4/20 with c/o SOB and swelling to legs; gradually worsening B LE weakness over past several days; deteriorating health over last several months.  Recently brought to area from home in [place] by her family so pt could be cared for by family here.  Pt admitted with myasthenia gravis with acute exacerbation.  PMH includes myasthenia gravis, ESRD on HD MWF, CHF, DM, htn.  ?Clinical Impression ? Prior to ED visit, pt reports being ambulatory short household distances with 4ww; recently moved to [facility] (from [place]); lives with her family (pt reports son provides 24/7 assist); stays on main level of home with 1 step to enter; son takes her to dialysis.  Currently pt is modified independent with bed mobility (increased effort/time for pt to perform on own); CGA with transfers using RW; and CGA ambulating 8 feet x2 with RW use (limited distance ambulating per pt (pt reporting still drowsy from medication given last night in order to sleep and unable  to walk any further d/t drowsiness; pt also reports only walking short household distances with walker at baseline).  Pt would benefit from skilled PT to address noted impairments and functional limitations (see below for any additional details).  Upon hospital discharge, pt would benefit from [place] (and continued 24/7 assist); pt would also benefit from manual w/c and BSC d/t overall weakness and decreased activity tolerance/decreased endurance.   ? ?Recommendations for follow up therapy are one component of a multi-disciplinary discharge planning process, led by the attending physician.  Recommendations may be updated based on patient status, additional functional criteria and insurance authorization. ? ?Follow Up Recommendations Home health PT ? ?  ?Assistance Recommended  at Discharge Frequent or constant Supervision/Assistance  ?Patient can return home with the following ? A little help with walking and/or transfers;A little help with bathing/dressing/bathroom;Assistance with cooking/housework;Help with stairs or ramp for entrance;Assist for transportation ? ?  ?Equipment Recommendations Rolling walker (2 wheels);BSC/3in1;Wheelchair (measurements PT);Wheelchair cushion (measurements PT)  ?Recommendations for Other Services ?    ?  ?Functional Status Assessment Patient has had a recent decline in their functional status and demonstrates the ability to make significant improvements in function in a reasonable and predictable amount of time.  ? ?  ?Precautions / Restrictions Precautions ?Precautions: Fall ?Restrictions ?Weight Bearing Restrictions: No  ? ?  ? ?Mobility ? Bed Mobility ?Overal bed mobility: Modified Independent ?  ?  ?  ?  ?  ?  ?General bed mobility comments: Semi-supine to/from sitting with increased effort/time to perform on own ?  ? ?Transfers ?Overall transfer level: Needs assistance ?Equipment used: Rolling walker (2 wheels) ?Transfers: Sit to/from Stand ?Sit to Stand: Min guard ?  ?  ?  ?  ?  ?General transfer comment: mild increased effort to stand up to RW (x1 trial from bed and x1 trial from chair) but steady with RW use ?  ? ?Ambulation/Gait ?Ambulation/Gait assistance: Min guard ?Gait Distance (Feet):  (8 feet x2) ?Assistive device: Rolling walker (2 wheels) ?  ?Gait velocity: decreased ?  ?  ?General Gait Details: limited distance per pt (pt reporting feeling too drowsy to walk any further); partial step through gait pattern; steady with RW use ? ?Stairs ?  ?  ?  ?  ?  ? ?Wheelchair Mobility ?  ? ?Modified Rankin (Stroke Patients Only) ?  ? ?  ? ?  Balance Overall balance assessment: Needs assistance ?Sitting-balance support: No upper extremity supported, Feet supported ?Sitting balance-Leahy Scale: Good ?Sitting balance - Comments: steady sitting reaching  within BOS ?  ?Standing balance support: Single extremity supported ?Standing balance-Leahy Scale: Fair ?Standing balance comment: steady standing with at least single UE support ?  ?  ?  ?  ?  ?  ?  ?  ?  ?  ?  ?   ? ? ? ?Pertinent Vitals/Pain Pain Assessment ?Pain Assessment: Faces ?Faces Pain Scale: No hurt ?Pain Intervention(s): Limited activity within patient's tolerance, Monitored during session, Repositioned ?Vitals (HR and O2 on room air) stable and WFL throughout treatment session.  ? ? ?Home Living Family/patient expects to be discharged to:: Private residence ?Living Arrangements: Children (Pt's son and daughter in law) ?Available Help at Discharge: Family ?Type of Home: House ?Home Access: Stairs to enter ?Entrance Stairs-Rails: None ?Entrance Stairs-Number of Steps: 1 ?  ?Home Layout: Two level;Able to live on main level with bedroom/bathroom ?Home Equipment: Grab bars - tub/shower;Grab bars - toilet;Rolling Walker (2 wheels);BSC/3in1;Rollator (4 wheels) ?   ?  ?Prior Function Prior Level of Function : Needs assist ?  ?  ?  ?  ?  ?  ?Mobility Comments: Ambulatory short household distances with walker (pt reports 4 wheels without seat).  Son brings pt to HD. ?  ?  ? ? ?Hand Dominance  ?   ? ?  ?Extremity/Trunk Assessment  ? Upper Extremity Assessment ?Upper Extremity Assessment: Generalized weakness (at least 3/5 AROM elbow flexion/extension and shoulder flexion to 80 degrees; good B hand grip strength) ?  ? ?Lower Extremity Assessment ?Lower Extremity Assessment: Generalized weakness (at least 3/5 AROM hip flexion, knee flexion/extension, and DF/PF) ?  ? ?Cervical / Trunk Assessment ?Cervical / Trunk Assessment: Other exceptions ?Cervical / Trunk Exceptions: forward head/shoulders  ?Communication  ? Communication: No difficulties  ?Cognition Arousal/Alertness: Awake/alert ?Behavior During Therapy: [facility] for tasks assessed/performed ?Overall Cognitive Status: No family/caregiver present to determine  baseline cognitive functioning ?  ?  ?  ?  ?  ?  ?  ?  ?  ?  ?  ?  ?  ?  ?  ?  ?General Comments: Oriented to person, place, month/year, and general situation. ?  ?  ? ?  ?General Comments  Nursing cleared pt for participation in physical therapy.  Pt agreeable to PT session. ? ?  ?Exercises    ? ?Assessment/Plan  ?  ?PT Assessment Patient needs continued PT services  ?PT Problem List Decreased strength;Decreased activity tolerance;Decreased balance;Decreased mobility;Decreased knowledge of use of DME;Decreased knowledge of precautions ? ?   ?  ?PT Treatment Interventions DME instruction;Gait training;Stair training;Functional mobility training;Therapeutic activities;Therapeutic exercise;Balance training;Patient/family education   ? ?PT Goals (Current goals can be found in the Care Plan section)  ?Acute Rehab PT Goals ?Patient Stated Goal: to improve strength ?PT Goal Formulation: With patient ?Time For Goal Achievement: 11/22/21 ?Potential to Achieve Goals: Fair ? ?  ?Frequency Min 2X/week ?  ? ? ?Co-evaluation   ?  ?  ?  ?  ? ? ?  ?AM-PAC PT "6 Clicks" Mobility  ?Outcome Measure Help needed turning from your back to your side while in a flat bed without using bedrails?: None ?Help needed moving from lying on your back to sitting on the side of a flat bed without using bedrails?: None ?Help needed moving to and from a bed to a chair (including a wheelchair)?: A Little ?Help needed standing  up from a chair using your arms (e.g., wheelchair or bedside chair)?: A Little ?Help needed to walk in hospital room?: A Little ?Help needed climbing 3-5 steps with a railing? : A Lot ?6 Click Score: 19 ? ?  ?End of Session Equipment Utilized During Treatment: Gait belt ?Activity Tolerance: Patient limited by fatigue;Other (comment) (Limited d/t c/o drowsiness) ?Patient left: in bed;with call bell/phone within reach;with bed alarm set ?Nurse Communication: Mobility status;Precautions ?PT Visit Diagnosis: Muscle weakness  (generalized) (M62.81);History of falling (Z91.81);Other abnormalities of gait and mobility (R26.89) ?  ? ?Time: 0240-9735 ?PT Time Calculation (min) (ACUTE ONLY): 21 min ? ? ?Charges:   PT Evaluation ?$PT Eval Low Comp

## 2021-10-12 NOTE — ED Notes (Signed)
Report given to Dialysis RN.

## 2021-10-12 NOTE — ED Notes (Addendum)
Dr. Sidney Ace in room to talk to pt, pt was requesting to speak to Charge RN, I introduced myself, pt stated she wanted to go home and is asking for her family, informed pt that she did not have family here at this time. Pt made comment that we are lying to her, pt continued to make comments about this being a fake hospital, and that I was using a fake computer.  Pt said "i'm not even hooked up to anything" I informed pt that the Primary RN Laqueta Linden tried to hook her up but she refused. Pt continued to make comments about the fake equipment. And asking for her family. Pt continues to state she wants to leave advised pt that it is 0145 am and that since her family are the ones that called EMS they may not want to take her home until she is evaluated and discharged properly.   ? ?Dr. Sidney Ace in room pt cooperative with his assessment and did not mention anything about the hospital being fake.  Pt is alert and oriented x4 and answering all questions appropriately about her health history.  Pt voiced concern about needing dialysis MWF ?

## 2021-10-12 NOTE — ED Notes (Signed)
MD at bedside. 

## 2021-10-12 NOTE — Assessment & Plan Note (Signed)
-   She has chronic systolic and diastolic CHF. ?- We will continue her BiDil and Entresto. ?- We will continue her beta-blocker therapy with [facility]. ?

## 2021-10-12 NOTE — TOC Initial Note (Signed)
Transition of Care (TOC) - Initial/Assessment Note  ? ? ?Patient Details  ?Name: Tammie Klein ?MRN: [id number] ?Date of Birth: 12/23/1950 ? ?Transition of Care (TOC) CM/SW Contact:    ?[name], RN ?Phone Number: ?10/12/2021, 2:01 PM ? ?Clinical Narrative:                 ?Patient admitted to the hospital with myasthenia gravis with acute exacerbation.  Patient is a dialysis patient, [name] MWF.  Son has been transporting her to dialysis.  Patient lives with son, her son and daughter in law recently brought the patient here to live from [place].  RNCM spoke with son via phone, patient is currently in dialysis.  Son expresses concerns with patient going back home, he thinks she is too weak and can't walk and that they can't take care of her.  [name] with patient pathways spoke with patient and she had concerns about transportation to dialysis so [name] was going to reach out to the dialysis center to assist with arranging transport.  Patient wanted a wheelchair for home.  PT is currently recommending HH, family was requesting PT re eval after dialysis.   ?RNCM will also put in for OT to eval.   ? ?Expected Discharge Plan: [name] ?Barriers to Discharge: Continued Medical Work up ? ? ?Patient Goals and CMS Choice ?Patient states their goals for this hospitalization and ongoing recovery are:: patient is currently in dialysis ?CMS Medicare.gov Compare Post Acute Care list provided to:: Patient ?Choice offered to / list presented to : Patient, Adult Children ? ?Expected Discharge Plan and Services ?Expected Discharge Plan: [name] ?  ?Discharge Planning Services: CM Consult ?Post Acute Care Choice: [name] ?Living arrangements for the past 2 months: [name] ?                ?DME Arranged: Lightweight manual wheelchair with seat cushion ?DME Agency: [employer] ?Date DME Agency Contacted: 10/12/21 ?Time DME Agency Contacted:  [phone] ?Representative spoke with at DME Agency: Suanne Marker ?HH Arranged: RN, PT, OT ?  ?  ?  ?  ? ?Prior Living Arrangements/Services ?Living arrangements for the past 2 months: [name] ?Lives with:: Adult Children ?Patient language and need for interpreter reviewed:: Yes ?Do you feel safe going back to the place where you live?: Yes      ?Need for Family Participation in Patient Care: Yes (Comment) (dialysis) ?Care giver support system in place?: Yes (comment) (son and daughter in law) ?Current home services: DME (2 wheel walker, 4 wheel walker) ?Criminal Activity/Legal Involvement Pertinent to Current Situation/Hospitalization: No - Comment as needed ? ?Activities of Daily Living ?Home Assistive Devices/Equipment: Gilford Rile (specify type) ?ADL Screening (condition at time of admission) ?Patient's cognitive ability adequate to safely complete daily activities?: No ?Is the patient deaf or have difficulty hearing?: No ?Does the patient have difficulty seeing, even when wearing glasses/contacts?: No ?Does the patient have difficulty concentrating, remembering, or making decisions?: Yes ?Patient able to express need for assistance with ADLs?: Yes ?Does the patient have difficulty dressing or bathing?: Yes ?Independently performs ADLs?: No ?Communication: Independent ?Feeding: Independent ?Walks in Home: Independent with device (comment) ?Does the patient have difficulty walking or climbing stairs?: Yes ?Weakness of Legs: Both ?Weakness of Arms/Hands: None ? ?Permission Sought/Granted ?Permission sought to share information with : Case Manager, Family Supports ?Permission granted to share information with : Yes, Verbal Permission Granted ? Share Information with NAME: Dila  Hassien ? Permission granted to share info w AGENCY: home health ? Permission granted to share info w Relationship: son ? Permission granted to share info w Contact Information: [phone] ? ?Emotional Assessment ?  ?  ?  ?Orientation: : Oriented  to Self, Oriented to Place, Oriented to  Time, Oriented to Situation ?Alcohol / Substance Use: Not Applicable ?Psych Involvement: No (comment) ? ?Admission diagnosis:  Myasthenia gravis with acute exacerbation ([place]) [G70.01] ?Myasthenia gravis with (acute) exacerbation ([place]) [G70.01] ?Patient Active Problem List  ? Diagnosis Date Noted  ? End-stage renal disease on hemodialysis ([place]) 10/12/2021  ? Dyslipidemia 10/12/2021  ? Essential hypertension 10/12/2021  ? Chronic systolic CHF (congestive heart failure) ([place]) 10/12/2021  ? Myasthenia gravis with (acute) exacerbation ([place]) 10/11/2021  ? ?PCP:  Pcp, No ?Pharmacy:  No Pharmacies Listed ? ? ? ?Social Determinants of Health (SDOH) Interventions ?  ? ?Readmission Risk Interventions ?   ? View : No data to display.  ?  ?  ?  ? ? ? ?

## 2021-10-12 NOTE — Consult Note (Signed)
ANTICOAGULATION CONSULT NOTE - Initial Consult ? ?Pharmacy Consult for warfarin ?Indication: Hx of DVT ? ?Allergies  ?Allergen Reactions  ? Ciprofloxacin Other (See Comments) and Nausea And Vomiting  ?  Muscle aches ?Muscle aches ?Muscle aches ?  ? Buprenorphine Hcl Nausea And Vomiting  ? Solifenacin Itching and Other (See Comments)  ?  Other reaction(s): Other ?Other reaction(s): Other ?Other reaction(s): Other ?  ? ? ?Patient Measurements: ?Height: 5\' 3"  (160 cm) ?Weight: 54.4 kg (120 lb) ?IBW/kg (Calculated) : 52.4 ? ? ?Vital Signs: ?Temp: 98 ?F (36.7 ?C) (04/21 4562) ?Temp Source: Oral (04/21 0737) ?BP: 142/67 (04/21 0737) ?Pulse Rate: 82 (04/21 0737) ? ?Labs: ?Recent Labs  ?  10/11/21 ?1848 10/11/21 ?2034 10/12/21 ?0515  ?HGB 8.3*  --  7.4*  ?HCT 26.4*  --  24.2*  ?PLT 86*  --  79*  ?CREATININE  --  4.91* 4.90*  ?CKTOTAL  --  93  --   ? ? ?Estimated Creatinine Clearance: 9.1 mL/min (A) (by C-G formula based on SCr of 4.9 mg/dL (H)). ? ? ?Medical History: ?Past Medical History:  ?Diagnosis Date  ? Blood transfusion without reported diagnosis   ? CHF (congestive heart failure) ([place])   ? Diabetes mellitus without complication ([place])   ? Hypertension   ? Renal disorder   ? ? ?Medications:  ?Warfarin PTA regimen: 1 mg daily ? ?DDIs: reviewed  ? ?Assessment: ? 71 year old female with PMH ofchronic heart failure,  myasthenia gravis, and end-stage renal disease on hemodialysis and DVT on warfarin regimen outlined above. Admitted to [facility] 10/11/21 with SOB/weakness. Pharmacy has been consulted to manage warfarin while admitted. INR therapeutic (2.1) on admission.  ? ?Date INR Warfarin Dose  ?4/11 2.1 1 mg ? ?Goal of Therapy:  ?INR 2-3 ?Monitor platelets by anticoagulation protocol: Yes ?  ?Plan:  ?INR therapeutic ?Continue with home regimen of 1 mg daily tonight ?CBC/INR daily ? ?Dorothe Pea, PharmD, BCPS ?Clinical Pharmacist   ?10/12/2021,11:06 AM ? ? ?

## 2021-10-12 NOTE — Assessment & Plan Note (Signed)
-   The patient be admitted to a progressive unit bed. ?- We will continue her steroid therapy with p.o. prednisone. ?- Neurology consult will be obtained. ?-I sent a timed message for Dr. Cheral Marker. ?

## 2021-10-12 NOTE — Progress Notes (Signed)
?[facility]  ?ROUNDING NOTE  ? ?Subjective:  ? ?Tammie Klein is a 71 year old female with past medical histories including chronic heart failure, diabetes, hypertension, myasthenia gravis, and end-stage renal disease on hemodialysis.  Patient presents to the emergency department with shortness of breath and lower extremity weakness.  Patient has been admitted for Myasthenia gravis with acute exacerbation ([place]) [G70.01] ?Myasthenia gravis with (acute) exacerbation ([place]) [G70.01] ? ?Patient was recently moved to the area from [place] to live with her brother.  Patient recently started receiving outpatient dialysis treatments at [facility], on a Monday Wednesday Friday schedule.  Patient was moved to the area due to increased family concern of recent hospitalizations in [place] with lack of family in that area to assist.  Patient states she has never missed a dialysis treatment.  Reports her last treatment was received on Monday, tolerated well.  She states she has noticed lower extremity edema for couple weeks now.  Denies deviating from prescribed diet and fluid restriction.  Denies nausea, vomiting, or diarrhea.  States she makes small amount of urine.  Reports mild abdominal pain.  Reports nonproductive cough.  ? ?Labs on arrival consistent with renal patient.  Respiratory panel negative for influenza and COVID-19.  Chest x-ray shows mild cardiomegaly. ? ?We have been consulted to evaluate dialysis needs during this admission ? ? ?Objective:  ?Vital signs in last 24 hours:  ?Temp:  [98 ?F (36.7 ?C)-98.6 ?F (37 ?C)] 98.3 ?F (36.8 ?C) (04/21 1457) ?Pulse Rate:  [80-92] 87 (04/21 1445) ?Resp:  [13-37] 17 (04/21 1457) ?BP: (114-166)/(56-106) 133/59 (04/21 1457) ?SpO2:  [93 %-100 %] 100 % (04/21 1457) ?Weight:  [52.2 kg-55.8 kg] 55.8 kg (04/21 1457) ? ?Weight change:  ?Filed Weights  ? 10/11/21 1823 10/12/21 1129 10/12/21 1457  ?Weight: 54.4 kg 52.2 kg 55.8 kg  ? ? ?Intake/Output: ?I/O last  3 completed shifts: ?In: 1000 [IV Piggyback:1000] ?Out: -  ?  ?Intake/Output this shift: ? Total I/O ?In: 43 [P.O.:60] ?Out: 500 [Other:500] ? ?Physical Exam: ?General: NAD, resting in bed  ?Head: Normocephalic, atraumatic. Moist oral mucosal membranes  ?Eyes: Anicteric  ?Neck: Supple  ?Lungs:  Clear to auscultation, normal effort, room air  ?Heart: Regular rate and rhythm  ?Abdomen:  Soft, nontender, nondistended  ?Extremities: 1+ peripheral edema.  ?Neurologic: Nonfocal, moving all four extremities  ?Skin: No lesions  ?Access: Left aVF  ? ? ?Basic Metabolic Panel: ?Recent Labs  ?Lab 10/11/21 ?2034 10/12/21 ?5284 10/12/21 ?1140  ?NA 139 141 140  ?K 4.7 4.4 4.7  ?CL 100 104 102  ?CO2 24 22 21*  ?GLUCOSE 108* 86 102*  ?BUN 41* 45* 50*  ?CREATININE 4.91* 4.90* 5.28*  ?CALCIUM 8.2* 7.9* 8.0*  ?PHOS  --   --  4.6  ? ? ?Liver Function Tests: ?Recent Labs  ?Lab 10/12/21 ?1140  ?ALBUMIN 3.2*  ? ?No results for input(s): LIPASE, AMYLASE in the last 168 hours. ?No results for input(s): AMMONIA in the last 168 hours. ? ?CBC: ?Recent Labs  ?Lab 10/11/21 ?1848 10/12/21 ?1324 10/12/21 ?1140  ?WBC 5.6 5.5 5.5  ?HGB 8.3* 7.4* 7.6*  ?HCT 26.4* 24.2* 23.3*  ?MCV 98.9 100.0 96.3  ?PLT 86* 79* 68*  ? ? ?Cardiac Enzymes: ?Recent Labs  ?Lab 10/11/21 ?2034  ?CKTOTAL 93  ? ? ?BNP: ?Invalid input(s): POCBNP ? ?CBG: ?Recent Labs  ?Lab 10/11/21 ?1829  ?GLUCAP 119*  ? ? ?Microbiology: ?Results for orders placed or performed during the hospital encounter of 10/11/21  ?Resp Panel  by RT-PCR (Flu A&B, Covid) Nasopharyngeal Swab     Status: None  ? Collection Time: 10/11/21  8:34 PM  ? Specimen: Nasopharyngeal Swab; Nasopharyngeal(NP) swabs in vial transport medium  ?Result Value Ref Range Status  ? SARS Coronavirus 2 by RT PCR NEGATIVE NEGATIVE Final  ?  Comment: (NOTE) ?SARS-CoV-2 target nucleic acids are NOT DETECTED. ? ?The SARS-CoV-2 RNA is generally detectable in upper respiratory ?specimens during the acute phase of infection. The  lowest ?concentration of SARS-CoV-2 viral copies this assay can detect is ?138 copies/mL. A negative result does not preclude SARS-Cov-2 ?infection and should not be used as the sole basis for treatment or ?other patient management decisions. A negative result may occur with  ?improper specimen collection/handling, submission of specimen other ?than nasopharyngeal swab, presence of viral mutation(s) within the ?areas targeted by this assay, and inadequate number of viral ?copies(<138 copies/mL). A negative result must be combined with ?clinical observations, patient history, and epidemiological ?information. The expected result is Negative. ? ?Fact Sheet for Patients:  ?EntrepreneurPulse.com.au ? ?Fact Sheet for Healthcare Providers:  ?IncredibleEmployment.be ? ?This test is no t yet approved or cleared by the [place] FDA and  ?has been authorized for detection and/or diagnosis of SARS-CoV-2 by ?FDA under an Emergency Use Authorization (EUA). This EUA will remain  ?in effect (meaning this test can be used) for the duration of the ?COVID-19 declaration under Section 564(b)(1) of the Act, 21 ?U.S.C.section 360bbb-3(b)(1), unless the authorization is terminated  ?or revoked sooner.  ? ? ?  ? Influenza A by PCR NEGATIVE NEGATIVE Final  ? Influenza B by PCR NEGATIVE NEGATIVE Final  ?  Comment: (NOTE) ?The Xpert Xpress SARS-CoV-2/FLU/RSV plus assay is intended as an aid ?in the diagnosis of influenza from Nasopharyngeal swab specimens and ?should not be used as a sole basis for treatment. Nasal washings and ?aspirates are unacceptable for Xpert Xpress SARS-CoV-2/FLU/RSV ?testing. ? ?Fact Sheet for Patients: ?EntrepreneurPulse.com.au ? ?Fact Sheet for Healthcare Providers: ?IncredibleEmployment.be ? ?This test is not yet approved or cleared by the [place] FDA and ?has been authorized for detection and/or diagnosis of SARS-CoV-2 by ?FDA under  an Emergency Use Authorization (EUA). This EUA will remain ?in effect (meaning this test can be used) for the duration of the ?COVID-19 declaration under Section 564(b)(1) of the Act, 21 U.S.C. ?section 360bbb-3(b)(1), unless the authorization is terminated or ?revoked. ? ?Performed at [facility], [place], ?[place] [address] ?  ? ? ?Coagulation Studies: ?No results for input(s): LABPROT, INR in the last 72 hours. ? ?Urinalysis: ?No results for input(s): COLORURINE, LABSPEC, [place], GLUCOSEU, HGBUR, BILIRUBINUR, KETONESUR, PROTEINUR, UROBILINOGEN, NITRITE, LEUKOCYTESUR in the last 72 hours. ? ?Invalid input(s): APPERANCEUR  ? ? ?Imaging: ?DG Chest Portable 1 View ? ?Result Date: 10/11/2021 ?CLINICAL DATA:  Dyspnea EXAM: PORTABLE CHEST 1 VIEW COMPARISON:  None. FINDINGS: The lungs are symmetrically expanded. Mild interstitial thickening is likely chronic in nature. No superimposed focal pulmonary infiltrate. No pneumothorax or pleural effusion. Median sternotomy has been performed. Intra pulmonary pressure monitoring device noted within the left perihilar region. Mild cardiomegaly is present. Pulmonary vascularity is normal. No acute bone abnormality. IMPRESSION: No radiographic evidence of acute cardiopulmonary disease. Mild cardiomegaly. Electronically Signed   By: Fidela Salisbury M.D.   On: 10/11/2021 22:23   ? ? ?Medications:  ? ? sodium chloride    ? sodium chloride    ? ? Chlorhexidine Gluconate Cloth  6 each Topical Q0600  ? famotidine  20  mg Oral Daily  ? feeding supplement  237 mL Oral BID BM  ? furosemide  80 mg Oral BID  ? insulin aspart  0-5 Units Subcutaneous QHS  ? insulin aspart  0-9 Units Subcutaneous TID WC  ? insulin glargine-yfgn  4 Units Subcutaneous Q breakfast  ? isosorbide-hydrALAZINE  1 tablet Oral BID  ? latanoprost  1 drop Both Eyes QHS  ? lidocaine  1 patch Transdermal Q24H  ? megestrol  625 mg Oral Daily  ? melatonin  2.5 mg Oral QHS  ? mirtazapine  7.5 mg Oral  QHS  ? pentoxifylline  400 mg Oral Daily  ? predniSONE  5 mg Oral Q breakfast  ? sacubitril-valsartan  1 tablet Oral BID  ? senna-docusate  1 tablet Oral Daily  ? Warfarin - Pharmacist Dosing Inpatient   Does no

## 2021-10-13 DIAGNOSIS — G7001 Myasthenia gravis with (acute) exacerbation: Secondary | ICD-10-CM | POA: Diagnosis not present

## 2021-10-13 DIAGNOSIS — F331 Major depressive disorder, recurrent, moderate: Secondary | ICD-10-CM | POA: Diagnosis not present

## 2021-10-13 LAB — HEPATITIS B SURFACE ANTIBODY, QUANTITATIVE: Hep B S AB Quant (Post): 569.5 m[IU]/mL (ref >9.9)

## 2021-10-13 LAB — GLUCOSE, CAPILLARY
Glucose-Capillary: 104 mg/dL — ABNORMAL HIGH (ref 70–99)
Glucose-Capillary: 117 mg/dL — ABNORMAL HIGH (ref 70–99)
Glucose-Capillary: 118 mg/dL — ABNORMAL HIGH (ref 70–99)
Glucose-Capillary: 144 mg/dL — ABNORMAL HIGH (ref 70–99)
Glucose-Capillary: 69 mg/dL — ABNORMAL LOW (ref 70–99)
Glucose-Capillary: 76 mg/dL (ref 70–99)
Glucose-Capillary: 79 mg/dL (ref 70–99)

## 2021-10-13 LAB — PROTIME-INR
INR: 2.1 — ABNORMAL HIGH (ref 0.8–1.2)
Prothrombin Time: 23.8 seconds — ABNORMAL HIGH (ref 11.4–15.2)

## 2021-10-13 LAB — HIV ANTIBODY (ROUTINE TESTING W REFLEX): HIV Screen 4th Generation wRfx: NONREACTIVE

## 2021-10-13 MED ORDER — LOPERAMIDE HCL 2 MG PO CAPS
2.0000 mg | ORAL_CAPSULE | ORAL | Status: DC | PRN
  Administered 2021-10-13 – 2021-10-18 (×2): 2 mg via ORAL
  Filled 2021-10-13 (×2): qty 1

## 2021-10-13 MED ORDER — ESCITALOPRAM OXALATE 10 MG PO TABS
5.0000 mg | ORAL_TABLET | Freq: Every day | ORAL | Status: DC
  Administered 2021-10-13 – 2021-10-19 (×6): 5 mg via ORAL
  Filled 2021-10-13 (×6): qty 1

## 2021-10-13 MED ORDER — ADULT MULTIVITAMIN W/MINERALS CH
1.0000 | ORAL_TABLET | Freq: Every day | ORAL | Status: DC
Start: 2021-10-14 — End: 2021-10-19
  Administered 2021-10-14 – 2021-10-19 (×4): 1 via ORAL
  Filled 2021-10-13 (×5): qty 1

## 2021-10-13 MED ORDER — ENSURE ENLIVE PO LIQD
237.0000 mL | Freq: Three times a day (TID) | ORAL | Status: DC
  Administered 2021-10-17: 237 mL via ORAL

## 2021-10-13 MED ORDER — HYDROXYZINE HCL 10 MG PO TABS
10.0000 mg | ORAL_TABLET | Freq: Every day | ORAL | Status: DC | PRN
  Administered 2021-10-17: 10 mg via ORAL
  Filled 2021-10-13 (×3): qty 1

## 2021-10-13 NOTE — Evaluation (Signed)
Occupational Therapy Evaluation ?Patient Details ?Name: Tammie Klein ?MRN: [id number] ?DOB: [birth year] ?Today's Date: 10/13/2021 ? ? ?History of Present Illness Pt is a 71 y.o. female presenting to hospital 4/20 with c/o SOB and swelling to legs; gradually worsening B LE weakness over past several days; deteriorating health over last several months.  Recently brought to area from home in [place] by her family so pt could be cared for by family here.  Pt admitted with myasthenia gravis with acute exacerbation.  PMH includes myasthenia gravis, ESRD on HD MWF, CHF, DM, htn.  ? ?Clinical Impression ?  ?Chart reviewed, pt greeted in bed agreeable to OT evaluation. Pt is alert and oriented to self, place, situation, not oriented to date. Pt performs bed mobility with CGA-MIN A, ADL amb CGA-supervision to bathroom with RW. Toileting and bathing completed with MIN A. MAX A required for LB dressing. Pt presents with deficits in strength, endurance, activity tolerance all affecting safe and optimal ADL completion. Recommend STR to address functional deficits. Pt is left as received, NAD, all needs met. OT will follow acutely.  ?   ? ?Recommendations for follow up therapy are one component of a multi-disciplinary discharge planning process, led by the attending physician.  Recommendations may be updated based on patient status, additional functional criteria and insurance authorization.  ? ?Follow Up Recommendations ? Skilled nursing-short term rehab (<3 hours/day)  ?  ?Assistance Recommended at Discharge Intermittent Supervision/Assistance  ?Patient can return home with the following A little help with walking and/or transfers;A little help with bathing/dressing/bathroom;Assistance with cooking/housework;Assist for transportation;Direct supervision/assist for medications management;Help with stairs or ramp for entrance ? ?  ?Functional Status Assessment ? Patient has had a recent decline in their functional status and  demonstrates the ability to make significant improvements in function in a reasonable and predictable amount of time.  ?Equipment Recommendations ? Other (comment);BSC/3in1 (per next venue of care)  ?  ?Recommendations for Other Services   ? ? ?  ?Precautions / Restrictions Precautions ?Precautions: Fall ?Restrictions ?Weight Bearing Restrictions: No  ? ?  ? ?Mobility Bed Mobility ?Overal bed mobility: Needs Assistance ?Bed Mobility: Supine to Sit, Sit to Supine ?  ?  ?Supine to sit: Min assist, HOB elevated ?Sit to supine: Min guard, HOB elevated ?  ?  ?  ? ?Transfers ?Overall transfer level: Needs assistance ?Equipment used: Rolling walker (2 wheels) ?Transfers: Sit to/from Stand ?Sit to Stand: Min guard ?  ?  ?  ?  ?  ?  ?  ? ?  ?Balance Overall balance assessment: Needs assistance ?Sitting-balance support: No upper extremity supported, Feet supported ?Sitting balance-Leahy Scale: Good ?  ?  ?Standing balance support: Single extremity supported, Reliant on assistive device for balance, During functional activity ?Standing balance-Leahy Scale: Fair ?  ?  ?  ?  ?  ?  ?  ?  ?  ?  ?  ?  ?   ? ?ADL either performed or assessed with clinical judgement  ? ?ADL Overall ADL's : Needs assistance/impaired ?Eating/Feeding: Set up;Sitting ?  ?Grooming: Wash/dry hands;Wash/dry face;Min guard;Standing ?Grooming Details (indicate cue type and reason): sink level ?Upper Body Bathing: Minimal assistance;Sitting ?  ?Lower Body Bathing: Minimal assistance;Sit to/from stand ?Lower Body Bathing Details (indicate cue type and reason): with RW ?Upper Body Dressing : Set up;Sitting ?  ?Lower Body Dressing: Maximal assistance;Bed level ?Lower Body Dressing Details (indicate cue type and reason): socks ?Toilet Transfer: Nature conservation officer;Ambulation;Rolling walker (2 wheels) ?  ?Toileting- Water quality scientist  and Hygiene: Minimal assistance ?Toileting - Clothing Manipulation Details (indicate cue type and reason): peri care for  thoroughness, to thread underwear ?  ?  ?Functional mobility during ADLs: Supervision/safety;Min guard;Rolling walker (2 wheels) (to bathroom) ?   ? ? ? ?Vision Patient Visual Report: No change from baseline ?   ?   ?Perception   ?  ?Praxis   ?  ? ?Pertinent Vitals/Pain Pain Assessment ?Pain Assessment: No/denies pain  ? ? ? ?Hand Dominance   ?  ?Extremity/Trunk Assessment Upper Extremity Assessment ?Upper Extremity Assessment: Generalized weakness ?  ?Lower Extremity Assessment ?Lower Extremity Assessment: Generalized weakness ?  ?  ?  ?Communication Communication ?Communication: No difficulties ?  ?Cognition Arousal/Alertness: Awake/alert ?Behavior During Therapy: Flat affect, WFL for tasks assessed/performed ?  ?  ?  ?  ?  ?  ?  ?  ?  ?  ?  ?  ?  ?  ?  ?  ?  ?General Comments: oriented to person, place, situation; not oriented to date ?  ?  ?General Comments    ? ?  ?Exercises Other Exercises ?Other Exercises: edu re: role of OT role of rehab, discharge recommendations, importance of oob mobility ?  ?Shoulder Instructions    ? ? ?Home Living Family/patient expects to be discharged to:: Private residence ?Living Arrangements: Children (son and DIL) ?Available Help at Discharge: Family ?Type of Home: House ?Home Access: Stairs to enter ?Entrance Stairs-Number of Steps: 1 ?Entrance Stairs-Rails: None ?Home Layout: Two level;Able to live on main level with bedroom/bathroom ?  ?  ?Bathroom Shower/Tub: Walk-in shower ?  ?  ?  ?  ?Home Equipment: Grab bars - tub/shower;Grab bars - toilet;Rolling Walker (2 wheels);BSC/3in1;Rollator (4 wheels) ?  ?  ?  ? ?  ?Prior Functioning/Environment Prior Level of Function : Needs assist ?  ?  ?  ?  ?  ?  ?Mobility Comments: amb household distances with RW ?ADLs Comments: Pt reports she can perform ADLs however needs assist with LB dressing, bathing ?  ? ?  ?  ?OT Problem List: Decreased strength;Decreased activity tolerance;Decreased knowledge of use of DME or AE ?  ?   ?OT  Treatment/Interventions: Self-care/ADL training;Visual/perceptual remediation/compensation;Therapeutic exercise;Patient/family education;Balance training;DME and/or AE instruction;Therapeutic activities  ?  ?OT Goals(Current goals can be found in the care plan section) Acute Rehab OT Goals ?Patient Stated Goal: get stronger ?OT Goal Formulation: With patient ?Time For Goal Achievement: 11/03/21 ?Potential to Achieve Goals: Good ?ADL Goals ?Pt Will Perform Grooming: with modified independence ?Pt Will Perform Upper Body Dressing: with modified independence ?Pt Will Perform Lower Body Dressing: with modified independence ?Pt Will Transfer to Toilet: with modified independence ?Pt Will Perform Toileting - Clothing Manipulation and hygiene: with modified independence  ?OT Frequency: Min 2X/week ?  ? ?Co-evaluation   ?  ?  ?  ?  ? ?  ?AM-PAC OT "6 Clicks" Daily Activity     ?Outcome Measure Help from another person eating meals?: None ?Help from another person taking care of personal grooming?: None ?Help from another person toileting, which includes using toliet, bedpan, or urinal?: A Little ?Help from another person bathing (including washing, rinsing, drying)?: A Little ?Help from another person to put on and taking off regular upper body clothing?: None ?Help from another person to put on and taking off regular lower body clothing?: A Lot ?6 Click Score: 20 ?  ?End of Session Equipment Utilized During Treatment: Rolling walker (2 wheels) ?Nurse Communication: Mobility status ? ?  Activity Tolerance: Patient tolerated treatment well ?Patient left: in bed;with call bell/phone within reach;with bed alarm set ? ?OT Visit Diagnosis: Unsteadiness on feet (R26.81);Muscle weakness (generalized) (M62.81)  ?              ?Time: 4628-6381 ?OT Time Calculation (min): 13 min ?Charges:  OT General Charges ?$OT Visit: 1 Visit ?OT Evaluation ?$OT Eval Low Complexity: 1 Low ? ?Shanon Payor, OTD OTR/L  ?10/13/21, 4:09 PM  ?

## 2021-10-13 NOTE — Progress Notes (Signed)
?   10/13/21 1600  ?Clinical Encounter Type  ?Visited With Patient  ?Visit Type Follow-up  ?Referral From Nurse  ?Spiritual Encounters  ?Spiritual Needs Prayer;Emotional  ? ?Chaplain responded to [facility] request for prayer. Chaplain interceded through compassionate presence, reflective listening and prayer. Patient indicated she had a lot of concerns on her mind. ?

## 2021-10-13 NOTE — Progress Notes (Signed)
?PROGRESS NOTE ? ? ? ?Tammie Klein  KVQ:[id number] DOB: 09-17-1950 DOA: 10/11/2021 ?PCP: Pcp, No  ? ? ?Brief Narrative:  ?71 year old with history of myasthenia gravis, ESRD on hemodialysis MWF, type 2 diabetes and hypertension recently moved from [place] to [place] to live with her son and daughter-in-law brought to ER with shortness of breath and bilateral lower extremity weakness.  Patient complained of shortness of breath worsening over the last few months.  She has occasional cough but no wheezing.  Extremity weakness is ongoing for many weeks, recently worse to the extent she is not able to walk very well.  In the emergency room hemodynamically stable.  Chest x-ray with mild cardiomegaly.  Given 1 L fluid bolus.  Nephrology and neurology consulted and admitted to the hospital. 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  She also has history of carotid artery stenosis. ? ?Assessment & Plan: ?  ?Bilateral lower extremity weakness and suspected myasthenia gravis with acute exacerbation: ?Currently hemodynamically stable.  Continue to be monitored by respiratory therapy with PFTs.  Respiratory status better after hemodialysis.  Generalized weakness and fatigue. ?Followed by neurology.  Monitoring closely for any need for IVIG. ?Currently remains on prednisone daily. ?Continue to work with PT OT to improve mobility and strength. ?Metoprolol and statins discontinued to avoid exacerbation. ? ?ESRD on hemodialysis: Right upper extremity AV fistula present.  On Coumadin.  Significant bleeding after cannulation.  Will be seen by vascular surgery.  Received hemodialysis with clinical improvement yesterday.  Followed by nephrology. ? ?Type 2 diabetes, well controlled: Currently on insulin doses.  We will continue. ? ?History of recurrent DVT: Patient has records of 3 episodes of DVTs since last year.  She was asked to stay on blood thinners lifelong.  Patient describes  that she was on Eliquis and they found another blood clot, however she thinks she subsequently had 2 blood clot at once.  Patient is requesting to go back on Eliquis and monitor for symptoms, agree.  Will start on Eliquis and stop Coumadin. ? ?Frailty and debility: Continue to work with PT OT.  SNF versus home with home health therapies. ? ? ?DVT prophylaxis:  ?warfarin (COUMADIN) tablet 1 mg  ? ?Code Status: Full code ?Family Communication: Son and daughter-in-law on the phone 4/21. ?Disposition Plan: Status is: Inpatient ?Remains inpatient appropriate because: Significant symptoms, weakness, neurological monitoring. ?  ? ? ?Consultants:  ?Neurology ?Nephrology ?Vascular surgery ? ?Procedures:  ?Routine hemodialysis ? ?Antimicrobials:  ?None ? ? ?Subjective: ?Patient seen and examined.  She looks more awake alert, more interactive today.  She tells me that yesterday she was so sleepy and tired.  On room air.  She tells me that her breathing is better. ?Her goal is to get stronger and go back to live with her son.  She is open to referral to local SNF. ?Unsure whether her out-of-[place] insurance will cover for local skilled facilities.  Case manager will explore possibilities. ? ?Objective: ?Vitals:  ? 10/12/21 2314 10/13/21 0354 10/13/21 6433 10/13/21 2951  ?BP: 136/66 (!) 114/49  128/63  ?Pulse: 90 85  84  ?Resp:  15  17  ?Temp: 98.6 ?F (37 ?C) 98.3 ?F (36.8 ?C)  97.8 ?F (36.6 ?C)  ?TempSrc: Oral   Oral  ?SpO2: 100% 100%  100%  ?Weight:   51.5 kg   ?Height:   5\' 3"  (1.6 m)   ? ? ?Intake/Output Summary (Last 24 hours) at 10/13/2021  1224 ?Last data filed at 10/13/2021 1007 ?Gross per 24 hour  ?Intake 160 ml  ?Output 500 ml  ?Net -340 ml  ? ?Filed Weights  ? 10/12/21 1129 10/12/21 1457 10/13/21 0525  ?Weight: 52.2 kg 55.8 kg 51.5 kg  ? ? ?Examination: ? ?General exam: Appears calm and comfortable  ?On room air.  Looks very comfortable today.  Interactive and talkative. ?Respiratory system: Clear to auscultation.  Respiratory effort normal.  Some basal crackles present. ?Cardiovascular system: S1 & S2 heard, RRR.  ?Gastrointestinal system: Soft.  Nontender.  Bowel sound present. ?Central nervous system: Alert and oriented.  No obvious neurological deficits. ?Extremities: Symmetric 5 x 5 power.  Generalized weakness. ?Right upper extremity AV fistula with thrill. ? ? ? ?Data Reviewed: I have personally reviewed following labs and imaging studies ? ?CBC: ?Recent Labs  ?Lab 10/11/21 ?1848 10/12/21 ?6387 10/12/21 ?1140  ?WBC 5.6 5.5 5.5  ?HGB 8.3* 7.4* 7.6*  ?HCT 26.4* 24.2* 23.3*  ?MCV 98.9 100.0 96.3  ?PLT 86* 79* 68*  ? ?Basic Metabolic Panel: ?Recent Labs  ?Lab 10/11/21 ?2034 10/12/21 ?5643 10/12/21 ?1140  ?NA 139 141 140  ?K 4.7 4.4 4.7  ?CL 100 104 102  ?CO2 24 22 21*  ?GLUCOSE 108* 86 102*  ?BUN 41* 45* 50*  ?CREATININE 4.91* 4.90* 5.28*  ?CALCIUM 8.2* 7.9* 8.0*  ?PHOS  --   --  4.6  ? ?GFR: ?Estimated Creatinine Clearance: 8.3 mL/min (A) (by C-G formula based on SCr of 5.28 mg/dL (H)). ?Liver Function Tests: ?Recent Labs  ?Lab 10/12/21 ?1140  ?ALBUMIN 3.2*  ? ?No results for input(s): LIPASE, AMYLASE in the last 168 hours. ?No results for input(s): AMMONIA in the last 168 hours. ?Coagulation Profile: ?Recent Labs  ?Lab 10/12/21 ?1230 10/13/21 ?0438  ?INR 2.1* 2.1*  ? ?Cardiac Enzymes: ?Recent Labs  ?Lab 10/11/21 ?2034  ?CKTOTAL 93  ? ?BNP (last 3 results) ?No results for input(s): PROBNP in the last 8760 hours. ?HbA1C: ?Recent Labs  ?  10/12/21 ?1213  ?HGBA1C 6.0*  ? ?CBG: ?Recent Labs  ?Lab 10/12/21 ?2200 10/13/21 ?3295 10/13/21 ?0435 10/13/21 ?1884 10/13/21 ?0834  ?GLUCAP 80 76 69* 104* 79  ? ?Lipid Profile: ?No results for input(s): CHOL, HDL, LDLCALC, TRIG, CHOLHDL, LDLDIRECT in the last 72 hours. ?Thyroid Function Tests: ?No results for input(s): TSH, T4TOTAL, FREET4, T3FREE, THYROIDAB in the last 72 hours. ?Anemia Panel: ?No results for input(s): VITAMINB12, FOLATE, FERRITIN, TIBC, IRON, RETICCTPCT in the last 72  hours. ?Sepsis Labs: ?No results for input(s): PROCALCITON, LATICACIDVEN in the last 168 hours. ? ?Recent Results (from the past 240 hour(s))  ?Resp Panel by RT-PCR (Flu A&B, Covid) Nasopharyngeal Swab     Status: None  ? Collection Time: 10/11/21  8:34 PM  ? Specimen: Nasopharyngeal Swab; Nasopharyngeal(NP) swabs in vial transport medium  ?Result Value Ref Range Status  ? SARS Coronavirus 2 by RT PCR NEGATIVE NEGATIVE Final  ?  Comment: (NOTE) ?SARS-CoV-2 target nucleic acids are NOT DETECTED. ? ?The SARS-CoV-2 RNA is generally detectable in upper respiratory ?specimens during the acute phase of infection. The lowest ?concentration of SARS-CoV-2 viral copies this assay can detect is ?138 copies/mL. A negative result does not preclude SARS-Cov-2 ?infection and should not be used as the sole basis for treatment or ?other patient management decisions. A negative result may occur with  ?improper specimen collection/handling, submission of specimen other ?than nasopharyngeal swab, presence of viral mutation(s) within the ?areas targeted by this assay, and inadequate number of viral ?copies(<138  copies/mL). A negative result must be combined with ?clinical observations, patient history, and epidemiological ?information. The expected result is Negative. ? ?Fact Sheet for Patients:  ?EntrepreneurPulse.com.au ? ?Fact Sheet for Healthcare Providers:  ?IncredibleEmployment.be ? ?This test is no t yet approved or cleared by the [place] FDA and  ?has been authorized for detection and/or diagnosis of SARS-CoV-2 by ?FDA under an Emergency Use Authorization (EUA). This EUA will remain  ?in effect (meaning this test can be used) for the duration of the ?COVID-19 declaration under Section 564(b)(1) of the Act, 21 ?U.S.C.section 360bbb-3(b)(1), unless the authorization is terminated  ?or revoked sooner.  ? ? ?  ? Influenza A by PCR NEGATIVE NEGATIVE Final  ? Influenza B by PCR NEGATIVE  NEGATIVE Final  ?  Comment: (NOTE) ?The Xpert Xpress SARS-CoV-2/FLU/RSV plus assay is intended as an aid ?in the diagnosis of influenza from Nasopharyngeal swab specimens and ?should not be used as a sole basis for

## 2021-10-13 NOTE — Consult Note (Signed)
ANTICOAGULATION CONSULT NOTE  ? ?Pharmacy Consult for Eliquis ?Indication: Hx of DVT ? ?Allergies  ?Allergen Reactions  ? Ciprofloxacin Other (See Comments) and Nausea And Vomiting  ?  Muscle aches ?Muscle aches ?Muscle aches ?  ? Buprenorphine Hcl Nausea And Vomiting  ? Solifenacin Itching and Other (See Comments)  ?  Other reaction(s): Other ?Other reaction(s): Other ?Other reaction(s): Other ?  ? ? ?Patient Measurements: ?Height: 5\' 3"  (160 cm) ?Weight: 51.5 kg (113 lb 8.6 oz) ?IBW/kg (Calculated) : 52.4 ? ? ?Vital Signs: ?Temp: 98 ?F (36.7 ?C) (04/22 1240) ?Temp Source: Oral (04/22 1240) ?BP: 123/51 (04/22 1240) ?Pulse Rate: 89 (04/22 1240) ? ?Labs: ?Recent Labs  ?  10/11/21 ?1848 10/11/21 ?2034 10/12/21 ?5621 10/12/21 ?1140 10/12/21 ?1230 10/13/21 ?0438  ?HGB 8.3*  --  7.4* 7.6*  --   --   ?HCT 26.4*  --  24.2* 23.3*  --   --   ?PLT 86*  --  79* 68*  --   --   ?LABPROT  --   --   --   --  23.7* 23.8*  ?INR  --   --   --   --  2.1* 2.1*  ?CREATININE  --  4.91* 4.90* 5.28*  --   --   ?CKTOTAL  --  93  --   --   --   --   ? ? ? ?Estimated Creatinine Clearance: 8.3 mL/min (A) (by C-G formula based on SCr of 5.28 mg/dL (H)). ? ? ?Medical History: ?Past Medical History:  ?Diagnosis Date  ? Blood transfusion without reported diagnosis   ? CHF (congestive heart failure) ([place])   ? Diabetes mellitus without complication ([place])   ? Hypertension   ? Renal disorder   ? ? ?Medications:  ?Warfarin PTA regimen: 1 mg daily ? ?DDIs: reviewed  ? ?Assessment: ?71 year old female with PMH ofchronic heart failure,  myasthenia gravis, and end-stage renal disease on hemodialysis and DVT on warfarin regimen outlined above. Admitted to [facility] 10/11/21 with SOB/weakness. Pharmacy has been consulted for switching patient from warfarin to Eliquis. Pt wants to stop warfarin and try Eliquis.  ? ? ?Goal of Therapy:  ?Monitor platelets by anticoagulation protocol: Yes ?  ?Plan:  ?INR 2.1. Will plan to start Eliquis 5 mg BID when INR <2. ?Re-check  INR with AM labs.  ? ?Sherilyn Banker, PharmD, BCPS ?Clinical Pharmacist   ?10/13/2021,12:42 PM ? ? ?

## 2021-10-13 NOTE — Discharge Instructions (Signed)
[facility]  Mental health service in [place], [place]  Address: [address], , [place] [address] Hours:  Closed ? Opens 8?AM Mon Phone: [phone] 

## 2021-10-13 NOTE — Progress Notes (Signed)
Initial Nutrition Assessment ? ?DOCUMENTATION CODES:  ? ?Not applicable ? ?INTERVENTION:  ? ?Ensure Enlive po TID, each supplement provides 350 kcal and 20 grams of protein. ? ?MVI po daily  ? ?Liberalize diet  ? ?NUTRITION DIAGNOSIS:  ? ?Increased nutrient needs related to chronic illness (ESRD on HD, CHF, MG) as evidenced by estimated needs. ? ?GOAL:  ? ?Patient will meet greater than or equal to 90% of their needs ? ?MONITOR:  ? ?PO intake, Supplement acceptance, Labs, Weight trends, Skin, I & O's ? ?REASON FOR ASSESSMENT:  ? ?Malnutrition Screening Tool ?  ? ?ASSESSMENT:  ? ?71 y/o female with h/o ESRD on HD, HTN, HLD, DM, CHF, DVT and myasthenia gravis who is admitted with acute exacerbation ? ?RD working remotely. ? ?Unable to reach pt by phone. Suspect pt with poor oral intake at baseline as pt is on megace and remeron. Pt documented to be eating 50% of meals in hospital. Pt is drinking Ensure supplements. RD will increase Ensure to three times daily and add MVI. RD will also liberalize pt's diet. Pt is likely at refeed risk. Per chart, pt is down 17lbs(17%) over the past 4 months; this is significant. Pt is at high risk for malnutrition. RD will obtain history and exam at follow up.  ? ?Medications reviewed and include: pepcid, lasix, insulin, megace, melatonin, remeron, prednisone  ?  ?Labs reviewed: K 4.7 wnl, BUN 50(H), creat 5.28(H), P 4.6 wnl ?Hgb 7.6(L), Hct 23.3(L) ?Cbgs- 117, 79, 104 x 24 hrs ? ?NUTRITION - FOCUSED PHYSICAL EXAM: ?Unable to perform a this time  ? ?Diet Order:   ?Diet Order   ? ?       ?  Diet Heart Room service appropriate? Yes; Fluid consistency: Thin  Diet effective now       ?  ? ?  ?  ? ?  ? ?EDUCATION NEEDS:  ? ?No education needs have been identified at this time ? ?Skin:  Skin Assessment: Reviewed RN Assessment ? ?Last BM:  4/21- type 7 ? ?Height:  ? ?Ht Readings from Last 1 Encounters:  ?10/13/21 5\' 3"  (1.6 m)  ? ? ?Weight:  ? ?Wt Readings from Last 1 Encounters:  ?10/13/21  51.5 kg  ? ? ?Ideal Body Weight:  52.3 kg ? ?BMI:  Body mass index is 20.11 kg/m?. ? ?Estimated Nutritional Needs:  ? ?Kcal:  1400-1600kcal/day ? ?Protein:  70-80g/day ? ?Fluid:  1.3-1.5L/day ? ?Koleen Distance MS, RD, LDN ?Please refer to [facility] for RD and/or RD on-call/weekend/after hours pager ? ?

## 2021-10-13 NOTE — Progress Notes (Signed)
?   10/13/21 1300  ?Clinical Encounter Type  ?Visited With Patient and family together  ?Visit Type Initial  ?Referral From Nurse  ? ?Chaplain responded to request for information on Advance Directive. Forms delivered to be completed.  ?

## 2021-10-13 NOTE — Consult Note (Signed)
ANTICOAGULATION CONSULT NOTE  ? ?Pharmacy Consult for warfarin ?Indication: Hx of DVT ? ?Allergies  ?Allergen Reactions  ? Ciprofloxacin Other (See Comments) and Nausea And Vomiting  ?  Muscle aches ?Muscle aches ?Muscle aches ?  ? Buprenorphine Hcl Nausea And Vomiting  ? Solifenacin Itching and Other (See Comments)  ?  Other reaction(s): Other ?Other reaction(s): Other ?Other reaction(s): Other ?  ? ? ?Patient Measurements: ?Height: 5\' 3"  (160 cm) ?Weight: 51.5 kg (113 lb 8.6 oz) ?IBW/kg (Calculated) : 52.4 ? ? ?Vital Signs: ?Temp: 97.8 ?F (36.6 ?C) (04/22 6389) ?Temp Source: Oral (04/22 3734) ?BP: 128/63 (04/22 2876) ?Pulse Rate: 84 (04/22 0833) ? ?Labs: ?Recent Labs  ?  10/11/21 ?1848 10/11/21 ?2034 10/12/21 ?8115 10/12/21 ?1140 10/12/21 ?1230 10/13/21 ?0438  ?HGB 8.3*  --  7.4* 7.6*  --   --   ?HCT 26.4*  --  24.2* 23.3*  --   --   ?PLT 86*  --  79* 68*  --   --   ?LABPROT  --   --   --   --  23.7* 23.8*  ?INR  --   --   --   --  2.1* 2.1*  ?CREATININE  --  4.91* 4.90* 5.28*  --   --   ?CKTOTAL  --  93  --   --   --   --   ? ? ? ?Estimated Creatinine Clearance: 8.3 mL/min (A) (by C-G formula based on SCr of 5.28 mg/dL (H)). ? ? ?Medical History: ?Past Medical History:  ?Diagnosis Date  ? Blood transfusion without reported diagnosis   ? CHF (congestive heart failure) ([place])   ? Diabetes mellitus without complication ([place])   ? Hypertension   ? Renal disorder   ? ? ?Medications:  ?Warfarin PTA regimen: 1 mg daily ? ?DDIs: reviewed  ? ?Assessment: ? 71 year old female with PMH ofchronic heart failure,  myasthenia gravis, and end-stage renal disease on hemodialysis and DVT on warfarin regimen outlined above. Ad[facility] 10/11/21 with SOB/weakness. Pharmacy has been consulted to manage warfarin while admitted. INR therapeutic (2.1) on admission.  ? ?Date INR Warfarin Dose  ?4/11 2.1 1 mg ?4/11 2.1 1 mg ? ?Goal of Therapy:  ?INR 2-3 ?Monitor platelets by anticoagulation protocol: Yes ?  ?Plan:  ?INR theraepeutic.  Will give warfarin 1 mg x 1 tonight (home dose). Daily INR. CBC at least every 3 days.  ?INR therapeutic ? ?Oswald Hillock, PharmD, BCPS ?Clinical Pharmacist   ?10/13/2021,10:55 AM ? ? ?

## 2021-10-13 NOTE — Progress Notes (Signed)
Physical Therapy Treatment ?Patient Details ?Name: Tammie Klein ?MRN: [id number] ?DOB: 01-28-1951 ?Today's Date: 10/13/2021 ? ? ?History of Present Illness Pt is a 71 y.o. female presenting to hospital 4/20 with c/o SOB and swelling to legs; gradually worsening B LE weakness over past several days; deteriorating health over last several months.  Recently brought to area from home in [place] by her family so pt could be cared for by family here.  Pt admitted with myasthenia gravis with acute exacerbation.  PMH includes myasthenia gravis, ESRD on HD MWF, CHF, DM, htn. ? ?  ?PT Comments  ? ? Pt in room with family ready for session.  To EOB with min guard. Steady in sitting.  She is able to stand from low bed height with min a x 1 and walk 20' with stop in bathroom then back to bed.  She does endorse feeling weak and dizzy at times with mobility.  Poor confidence.  While she is progressing towards gaols, change to SNF is recommended for discharge as she does demonstrate overall decrease in mobility, balance and strength.   ?  ?Recommendations for follow up therapy are one component of a multi-disciplinary discharge planning process, led by the attending physician.  Recommendations may be updated based on patient status, additional functional criteria and insurance authorization. ? ?Follow Up Recommendations ? Skilled nursing-short term rehab (<3 hours/day) ?  ?  ?Assistance Recommended at Discharge Frequent or constant Supervision/Assistance  ?Patient can return home with the following A little help with walking and/or transfers;A little help with bathing/dressing/bathroom;Assistance with cooking/housework;Help with stairs or ramp for entrance;Assist for transportation ?  ?Equipment Recommendations ? Rolling walker (2 wheels);BSC/3in1;Wheelchair (measurements PT);Wheelchair cushion (measurements PT)  ?  ?Recommendations for Other Services   ? ? ?  ?Precautions / Restrictions Precautions ?Precautions:  Fall ?Restrictions ?Weight Bearing Restrictions: No  ?  ? ?Mobility ? Bed Mobility ?Overal bed mobility: Needs Assistance ?Bed Mobility: Supine to Sit, Sit to Supine ?  ?  ?Supine to sit: Min guard ?Sit to supine: Min assist ?  ?General bed mobility comments: for LEs ?  ? ?Transfers ?Overall transfer level: Needs assistance ?Equipment used: Rolling walker (2 wheels) ?Transfers: Sit to/from Stand ?Sit to Stand: Min assist ?  ?  ?  ?  ?  ?General transfer comment: from lower surfaces ?  ? ?Ambulation/Gait ?Ambulation/Gait assistance: Min guard, Min assist ?Gait Distance (Feet): 20 Feet ?Assistive device: Rolling walker (2 wheels) ?Gait Pattern/deviations: Step-through pattern, Decreased step length - right, Decreased step length - left, Narrow base of support ?Gait velocity: decreased ?  ?  ?General Gait Details: c/o feeling weak and dizzy at times ? ? ?Stairs ?  ?  ?  ?  ?  ? ? ?Wheelchair Mobility ?  ? ?Modified Rankin (Stroke Patients Only) ?  ? ? ?  ?Balance Overall balance assessment: Needs assistance ?Sitting-balance support: No upper extremity supported, Feet supported ?Sitting balance-Leahy Scale: Good ?Sitting balance - Comments: steady sitting reaching within BOS ?  ?Standing balance support: Single extremity supported ?Standing balance-Leahy Scale: Fair ?Standing balance comment: BUE support on walker but does demonstrate general weakness ?  ?  ?  ?  ?  ?  ?  ?  ?  ?  ?  ?  ? ?  ?Cognition Arousal/Alertness: Awake/alert ?Behavior During Therapy: [facility] for tasks assessed/performed ?Overall Cognitive Status: No family/caregiver present to determine baseline cognitive functioning ?  ?  ?  ?  ?  ?  ?  ?  ?  ?  ?  ?  ?  ?  ?  ?  ?  ?  ?  ? ?  ?  Exercises   ? ?  ?General Comments   ?  ?  ? ?Pertinent Vitals/Pain Pain Assessment ?Pain Assessment: No/denies pain  ? ? ?Home Living   ?  ?  ?  ?  ?  ?  ?  ?  ?  ?   ?  ?Prior Function    ?  ?  ?   ? ?PT Goals (current goals can now be found in the care plan section)  Progress towards PT goals: Progressing toward goals ? ?  ?Frequency ? ? ? Min 2X/week ? ? ? ?  ?PT Plan Discharge plan needs to be updated  ? ? ?Co-evaluation   ?  ?  ?  ?  ? ?  ?AM-PAC PT "6 Clicks" Mobility   ?Outcome Measure ? Help needed turning from your back to your side while in a flat bed without using bedrails?: None ?Help needed moving from lying on your back to sitting on the side of a flat bed without using bedrails?: None ?Help needed moving to and from a bed to a chair (including a wheelchair)?: A Little ?Help needed standing up from a chair using your arms (e.g., wheelchair or bedside chair)?: A Little ?Help needed to walk in hospital room?: A Little ?Help needed climbing 3-5 steps with a railing? : A Lot ?6 Click Score: 19 ? ?  ?End of Session Equipment Utilized During Treatment: Gait belt ?Activity Tolerance: Patient limited by fatigue;Other (comment) (Limited d/t c/o drowsiness) ?Patient left: in bed;with call bell/phone within reach;with bed alarm set ?Nurse Communication: Mobility status;Precautions ?PT Visit Diagnosis: Muscle weakness (generalized) (M62.81);History of falling (Z91.81);Other abnormalities of gait and mobility (R26.89) ?  ? ? ?Time: 0350-0938 ?PT Time Calculation (min) (ACUTE ONLY): 12 min ? ?Charges:  $Gait Training: 8-22 mins          ?         Chesley Noon, PTA ?10/13/21, 1:09 PM ? ?

## 2021-10-13 NOTE — Progress Notes (Signed)
?[facility]  ?ROUNDING NOTE  ? ?Subjective:  ? ?Tammie Klein is a 71 year old female with past medical histories including chronic heart failure, diabetes, hypertension, myasthenia gravis, and end-stage renal disease on hemodialysis.  Patient presents to the emergency department with shortness of breath and lower extremity weakness.  Patient has been admitted for Myasthenia gravis with acute exacerbation ([place]) [G70.01] ?Myasthenia gravis with (acute) exacerbation ([place]) [G70.01] ? ?Patient was recently moved to the area from [place] to live with her brother.  Patient recently started receiving outpatient dialysis treatments at [facility], on a Monday Wednesday Friday schedule.  ? ?Update ?Patient appealed to wear today, in good spirits ?Currently returning to bed with walker and 1 person assist ?States she feels slightly stronger today however remains weaker than normal baseline.  Appetite remains poor ? ? ?Objective:  ?Vital signs in last 24 hours:  ?Temp:  [97.8 ?F (36.6 ?C)-98.6 ?F (37 ?C)] 97.8 ?F (36.6 ?C) (04/22 5188) ?Pulse Rate:  [80-95] 84 (04/22 0833) ?Resp:  [13-37] 17 (04/22 4166) ?BP: (114-153)/(49-106) 128/63 (04/22 0630) ?SpO2:  [93 %-100 %] 100 % (04/22 1601) ?Weight:  [51.5 kg-55.8 kg] 51.5 kg (04/22 0525) ? ?Weight change: -2.232 kg ?Filed Weights  ? 10/12/21 1129 10/12/21 1457 10/13/21 0525  ?Weight: 52.2 kg 55.8 kg 51.5 kg  ? ? ?Intake/Output: ?I/O last 3 completed shifts: ?In: 1060 [P.O.:60; IV Piggyback:1000] ?Out: 500 [Other:500] ?  ?Intake/Output this shift: ? Total I/O ?In: 160 [P.O.:160] ?Out: -  ? ?Physical Exam: ?General: NAD, resting in bed  ?Head: Normocephalic, atraumatic. Moist oral mucosal membranes  ?Eyes: Anicteric  ?Lungs:  Clear to auscultation, normal effort, room air  ?Heart: Regular rate and rhythm  ?Abdomen:  Soft, nontender, nondistended  ?Extremities: 1+ peripheral edema.  ?Neurologic: Nonfocal, moving all four extremities  ?Skin: No lesions   ?Access: Left aVF  ? ? ?Basic Metabolic Panel: ?Recent Labs  ?Lab 10/11/21 ?2034 10/12/21 ?0932 10/12/21 ?1140  ?NA 139 141 140  ?K 4.7 4.4 4.7  ?CL 100 104 102  ?CO2 24 22 21*  ?GLUCOSE 108* 86 102*  ?BUN 41* 45* 50*  ?CREATININE 4.91* 4.90* 5.28*  ?CALCIUM 8.2* 7.9* 8.0*  ?PHOS  --   --  4.6  ? ? ? ?Liver Function Tests: ?Recent Labs  ?Lab 10/12/21 ?1140  ?ALBUMIN 3.2*  ? ? ?No results for input(s): LIPASE, AMYLASE in the last 168 hours. ?No results for input(s): AMMONIA in the last 168 hours. ? ?CBC: ?Recent Labs  ?Lab 10/11/21 ?1848 10/12/21 ?3557 10/12/21 ?1140  ?WBC 5.6 5.5 5.5  ?HGB 8.3* 7.4* 7.6*  ?HCT 26.4* 24.2* 23.3*  ?MCV 98.9 100.0 96.3  ?PLT 86* 79* 68*  ? ? ? ?Cardiac Enzymes: ?Recent Labs  ?Lab 10/11/21 ?2034  ?CKTOTAL 93  ? ? ? ?BNP: ?Invalid input(s): POCBNP ? ?CBG: ?Recent Labs  ?Lab 10/12/21 ?2200 10/13/21 ?3220 10/13/21 ?0435 10/13/21 ?2542 10/13/21 ?0834  ?GLUCAP 80 76 69* 104* 79  ? ? ? ?Microbiology: ?Results for orders placed or performed during the hospital encounter of 10/11/21  ?Resp Panel by RT-PCR (Flu A&B, Covid) Nasopharyngeal Swab     Status: None  ? Collection Time: 10/11/21  8:34 PM  ? Specimen: Nasopharyngeal Swab; Nasopharyngeal(NP) swabs in vial transport medium  ?Result Value Ref Range Status  ? SARS Coronavirus 2 by RT PCR NEGATIVE NEGATIVE Final  ?  Comment: (NOTE) ?SARS-CoV-2 target nucleic acids are NOT DETECTED. ? ?The SARS-CoV-2 RNA is generally detectable in upper respiratory ?specimens during the  acute phase of infection. The lowest ?concentration of SARS-CoV-2 viral copies this assay can detect is ?138 copies/mL. A negative result does not preclude SARS-Cov-2 ?infection and should not be used as the sole basis for treatment or ?other patient management decisions. A negative result may occur with  ?improper specimen collection/handling, submission of specimen other ?than nasopharyngeal swab, presence of viral mutation(s) within the ?areas targeted by this assay, and  inadequate number of viral ?copies(<138 copies/mL). A negative result must be combined with ?clinical observations, patient history, and epidemiological ?information. The expected result is Negative. ? ?Fact Sheet for Patients:  ?EntrepreneurPulse.com.au ? ?Fact Sheet for Healthcare Providers:  ?IncredibleEmployment.be ? ?This test is no t yet approved or cleared by the [place] FDA and  ?has been authorized for detection and/or diagnosis of SARS-CoV-2 by ?FDA under an Emergency Use Authorization (EUA). This EUA will remain  ?in effect (meaning this test can be used) for the duration of the ?COVID-19 declaration under Section 564(b)(1) of the Act, 21 ?U.S.C.section 360bbb-3(b)(1), unless the authorization is terminated  ?or revoked sooner.  ? ? ?  ? Influenza A by PCR NEGATIVE NEGATIVE Final  ? Influenza B by PCR NEGATIVE NEGATIVE Final  ?  Comment: (NOTE) ?The Xpert Xpress SARS-CoV-2/FLU/RSV plus assay is intended as an aid ?in the diagnosis of influenza from Nasopharyngeal swab specimens and ?should not be used as a sole basis for treatment. Nasal washings and ?aspirates are unacceptable for Xpert Xpress SARS-CoV-2/FLU/RSV ?testing. ? ?Fact Sheet for Patients: ?EntrepreneurPulse.com.au ? ?Fact Sheet for Healthcare Providers: ?IncredibleEmployment.be ? ?This test is not yet approved or cleared by the [place] FDA and ?has been authorized for detection and/or diagnosis of SARS-CoV-2 by ?FDA under an Emergency Use Authorization (EUA). This EUA will remain ?in effect (meaning this test can be used) for the duration of the ?COVID-19 declaration under Section 564(b)(1) of the Act, 21 U.S.C. ?section 360bbb-3(b)(1), unless the authorization is terminated or ?revoked. ? ?Performed at [facility], [place], ?[place] [address] ?  ? ? ?Coagulation Studies: ?Recent Labs  ?  10/12/21 ?1230 10/13/21 ?0438  ?LABPROT 23.7*  23.8*  ?INR 2.1* 2.1*  ? ? ?Urinalysis: ?No results for input(s): COLORURINE, LABSPEC, [place], GLUCOSEU, HGBUR, BILIRUBINUR, KETONESUR, PROTEINUR, UROBILINOGEN, NITRITE, LEUKOCYTESUR in the last 72 hours. ? ?Invalid input(s): APPERANCEUR  ? ? ?Imaging: ?DG Chest Portable 1 View ? ?Result Date: 10/11/2021 ?CLINICAL DATA:  Dyspnea EXAM: PORTABLE CHEST 1 VIEW COMPARISON:  None. FINDINGS: The lungs are symmetrically expanded. Mild interstitial thickening is likely chronic in nature. No superimposed focal pulmonary infiltrate. No pneumothorax or pleural effusion. Median sternotomy has been performed. Intra pulmonary pressure monitoring device noted within the left perihilar region. Mild cardiomegaly is present. Pulmonary vascularity is normal. No acute bone abnormality. IMPRESSION: No radiographic evidence of acute cardiopulmonary disease. Mild cardiomegaly. Electronically Signed   By: Fidela Salisbury M.D.   On: 10/11/2021 22:23   ? ? ?Medications:  ? ? ? ? Chlorhexidine Gluconate Cloth  6 each Topical Q0600  ? famotidine  20 mg Oral Daily  ? feeding supplement  237 mL Oral BID BM  ? furosemide  80 mg Oral BID  ? insulin aspart  0-5 Units Subcutaneous QHS  ? insulin aspart  0-9 Units Subcutaneous TID WC  ? insulin glargine-yfgn  4 Units Subcutaneous Q breakfast  ? isosorbide-hydrALAZINE  1 tablet Oral BID  ? latanoprost  1 drop Both Eyes QHS  ? lidocaine  1 patch Transdermal Q24H  ?  megestrol  625 mg Oral Daily  ? melatonin  2.5 mg Oral QHS  ? mirtazapine  7.5 mg Oral QHS  ? pentoxifylline  400 mg Oral Daily  ? predniSONE  5 mg Oral Q breakfast  ? sacubitril-valsartan  1 tablet Oral BID  ? senna-docusate  1 tablet Oral Daily  ? warfarin  1 mg Oral q1600  ? Warfarin - Pharmacist Dosing Inpatient   Does not apply [id number]  ? ?acetaminophen **OR** acetaminophen, magnesium hydroxide, nitroGLYCERIN, ondansetron **OR** ondansetron (ZOFRAN) IV, traZODone ? ?Assessment/ Plan:  ?[name] is a 71 y.o.  female with past medical  histories including chronic heart failure, diabetes, hypertension, myasthenia gravis, and end-stage renal disease on hemodialysis.  Patient presents to the emergency department with shortness of breath and lower

## 2021-10-13 NOTE — Consult Note (Signed)
[facility] Face-to-Face Psychiatry Consult  ? ?Reason for Consult:  depression ?Referring Physician:  Dr Sloan Leiter ?Patient Identification: Tammie Klein ?MRN:  [id number] ?Principal Diagnosis: Myasthenia gravis with (acute) exacerbation ([place]) ?Diagnosis:  Principal Problem: ?  Myasthenia gravis with (acute) exacerbation ([place]) ?Active Problems: ?  Major depressive disorder, recurrent episode, moderate ([place]) ?  End-stage renal disease on hemodialysis ([place]) ?  Dyslipidemia ?  Essential hypertension ?  Chronic systolic CHF (congestive heart failure) ([place]) ? ? ?Total Time spent with patient: 45 minutes ? ?Subjective:   ?Tammie Klein is a 71 y.o. female patient admitted with shortness of breath, consult for depression. ? ?HPI:  71 yo female admitted for SOB and edema after dialysis yesterday, consult for depression. She moved to [place] from [place] last Sunday to live with her son and daughter-in-law to assist in her care.  Since then, her depression and anxiety have improved significantly.  Her depression is moderate with no suicidal ideations.  Moderate to high anxiety related to stress of move, panic attacks at times.  Sleep is "good" since moving to [place], "restful".  She is hungry but having difficulty swallowing food.  No psychosis, homicidal ideations, or substance abuse.  Discussed medications and decided on low dose of Lexapro which demonstrates low risk with her myasthenia gravis along with hydroxyzine 10 mg daily PRN for panic attacks started.  Explained side effects and how to take it. She is agreeable to also explore therapy, resources placed in discharge instructions.   ? ?Past Psychiatric History: depression, anxiety, panic d/o ? ?Risk to Self:  none ?Risk to Others:  none ?Prior Inpatient Therapy:  none ?Prior Outpatient Therapy:  none ? ?Past Medical History:  ?Past Medical History:  ?Diagnosis Date  ? Blood transfusion without reported diagnosis   ? CHF (congestive heart failure) ([place])   ? Diabetes mellitus without  complication ([place])   ? Hypertension   ? Renal disorder   ?  ?Past Surgical History:  ?Procedure Laterality Date  ? ABDOMINAL HYSTERECTOMY    ? CARDIAC CATHETERIZATION  2023  ? Thymus Gland Removal    ? ?Family History: No family history on file. ?Family Psychiatric  History: none ?Social History:  ?Social History  ? ?Substance and Sexual Activity  ?Alcohol Use Never  ?   ?Social History  ? ?Substance and Sexual Activity  ?Drug Use Never  ?  ?Social History  ? ?Socioeconomic History  ? Marital status: Single  ?  Spouse name: Not on file  ? Number of children: Not on file  ? Years of education: Not on file  ? Highest education level: Not on file  ?Occupational History  ? Not on file  ?Tobacco Use  ? Smoking status: Never  ? Smokeless tobacco: Never  ?Vaping Use  ? Vaping Use: Never used  ?Substance and Sexual Activity  ? Alcohol use: Never  ? Drug use: Never  ? Sexual activity: Not on file  ?Other Topics Concern  ? Not on file  ?Social History Narrative  ? Not on file  ? ?Social Determinants of Health  ? ?Financial Resource Strain: Not on file  ?Food Insecurity: Not on file  ?Transportation Needs: Not on file  ?Physical Activity: Not on file  ?Stress: Not on file  ?Social Connections: Not on file  ? ?Additional Social History: ?  ? ?Allergies:   ?Allergies  ?Allergen Reactions  ? Ciprofloxacin Other (See Comments) and Nausea And Vomiting  ?  Muscle aches ?Muscle aches ?Muscle aches ?  ? Buprenorphine  Hcl Nausea And Vomiting  ? Solifenacin Itching and Other (See Comments)  ?  Other reaction(s): Other ?Other reaction(s): Other ?Other reaction(s): Other ?  ? ? ?Labs:  ?Results for orders placed or performed during the hospital encounter of 10/11/21 (from the past 48 hour(s))  ?CBG monitoring, ED     Status: Abnormal  ? Collection Time: 10/11/21  6:29 PM  ?Result Value Ref Range  ? Glucose-Capillary 119 (H) 70 - 99 mg/dL  ?  Comment: Glucose reference range applies only to samples taken after fasting for at least 8 hours.   ?CBC     Status: Abnormal  ? Collection Time: 10/11/21  6:48 PM  ?Result Value Ref Range  ? WBC 5.6 4.0 - 10.5 K/uL  ? RBC 2.67 (L) 3.87 - 5.11 MIL/uL  ? Hemoglobin 8.3 (L) 12.0 - 15.0 g/dL  ? HCT 26.4 (L) 36.0 - 46.0 %  ? MCV 98.9 80.0 - 100.0 fL  ? MCH 31.1 26.0 - 34.0 pg  ? MCHC 31.4 30.0 - 36.0 g/dL  ? RDW 17.2 (H) 11.5 - 15.5 %  ? Platelets 86 (L) 150 - 400 K/uL  ?  Comment: Immature Platelet Fraction may be ?clinically indicated, consider ?ordering this additional test ?[id number] ?  ? nRBC 1.8 (H) 0.0 - 0.2 %  ?  Comment: Performed at [facility], [address], [place], [place] [address]  ?CK     Status: None  ? Collection Time: 10/11/21  8:34 PM  ?Result Value Ref Range  ? Total CK 93 38 - 234 U/L  ?  Comment: Performed at [facility], [address] [place], [place] [address]  ?Basic metabolic panel     Status: Abnormal  ? Collection Time: 10/11/21  8:34 PM  ?Result Value Ref Range  ? Sodium 139 135 - 145 mmol/L  ? Potassium 4.7 3.5 - 5.1 mmol/L  ? Chloride 100 98 - 111 mmol/L  ? CO2 24 22 - 32 mmol/L  ? Glucose, Bld 108 (H) 70 - 99 mg/dL  ?  Comment: Glucose reference range applies only to samples taken after fasting for at least 8 hours.  ? BUN 41 (H) 8 - 23 mg/dL  ? Creatinine, Ser 4.91 (H) 0.44 - 1.00 mg/dL  ? Calcium 8.2 (L) 8.9 - 10.3 mg/dL  ? GFR, Estimated 9 (L) >60 mL/min  ?  Comment: (NOTE) ?Calculated using the CKD-EPI Creatinine Equation (2021) ?  ? Anion gap 15 5 - 15  ?  Comment: Performed at [facility], [address] [place], [place] [address]  ?Resp Panel by RT-PCR (Flu A&B, Covid) Nasopharyngeal Swab     Status: None  ? Collection Time: 10/11/21  8:34 PM  ? Specimen: Nasopharyngeal Swab; Nasopharyngeal(NP) swabs in vial transport medium  ?Result Value Ref Range  ? SARS Coronavirus 2 by RT PCR NEGATIVE NEGATIVE  ?  Comment: (NOTE) ?SARS-CoV-2 target nucleic acids are NOT DETECTED. ? ?The SARS-CoV-2 RNA is generally detectable in upper  respiratory ?specimens during the acute phase of infection. The lowest ?concentration of SARS-CoV-2 viral copies this assay can detect is ?138 copies/mL. A negative result does not preclude SARS-Cov-2 ?infection and should not be used as the sole basis for treatment or ?other patient management decisions. A negative result may occur with  ?improper specimen collection/handling, submission of specimen other ?than nasopharyngeal swab, presence of viral mutation(s) within the ?areas targeted by this assay, and inadequate number of viral ?copies(<138 copies/mL). A negative result must  be combined with ?clinical observations, patient history, and epidemiological ?information. The expected result is Negative. ? ?Fact Sheet for Patients:  ?EntrepreneurPulse.com.au ? ?Fact Sheet for Healthcare Providers:  ?IncredibleEmployment.be ? ?This test is no t yet approved or cleared by the [place] FDA and  ?has been authorized for detection and/or diagnosis of SARS-CoV-2 by ?FDA under an Emergency Use Authorization (EUA). This EUA will remain  ?in effect (meaning this test can be used) for the duration of the ?COVID-19 declaration under Section 564(b)(1) of the Act, 21 ?U.S.C.section 360bbb-3(b)(1), unless the authorization is terminated  ?or revoked sooner.  ? ? ?  ? Influenza A by PCR NEGATIVE NEGATIVE  ? Influenza B by PCR NEGATIVE NEGATIVE  ?  Comment: (NOTE) ?The Xpert Xpress SARS-CoV-2/FLU/RSV plus assay is intended as an aid ?in the diagnosis of influenza from Nasopharyngeal swab specimens and ?should not be used as a sole basis for treatment. Nasal washings and ?aspirates are unacceptable for Xpert Xpress SARS-CoV-2/FLU/RSV ?testing. ? ?Fact Sheet for Patients: ?EntrepreneurPulse.com.au ? ?Fact Sheet for Healthcare Providers: ?IncredibleEmployment.be ? ?This test is not yet approved or cleared by the [place] FDA and ?has been authorized for  detection and/or diagnosis of SARS-CoV-2 by ?FDA under an Emergency Use Authorization (EUA). This EUA will remain ?in effect (meaning this test can be used) for the duration of the ?COVID-19 declaration under Section 564(b)(1) of t

## 2021-10-13 NOTE — Consult Note (Signed)
? ?[facility] [place] ? ?Patient name: Tammie Klein MRN: [id number] DOB: 11-Jul-1950 Sex: female ? ? ?REQUESTING PROVIDER:  ? ?Renal ? ? ?REASON FOR CONSULT:  ?  ?Bleeding after dialysis ? ?HISTORY OF PRESENT ILLNESS:  ? ?Tammie Klein is a 71 y.o. female, who I have been asked to evaluate for bleeding after dialysis.  The patient has recently moved from [place] to [place].  She was admitted with shortness of breath and bilateral lower extremity weakness with suspected myasthenia gravis with acute exacerbation.  She has had prolonged bleeding after dialysis.  She is on Coumadin.  She has a right upper arm fistula.  She tells me that she does have stents within her fistula and that this is happened before.  She is a diabetic with hypertension ?PAST MEDICAL HISTORY  ? ? ?Past Medical History:  ?Diagnosis Date  ? Blood transfusion without reported diagnosis   ? CHF (congestive heart failure) ([place])   ? Diabetes mellitus without complication ([place])   ? Hypertension   ? Renal disorder   ? ? ? ?FAMILY HISTORY  ? ?No family history on file. ? ?SOCIAL HISTORY:  ? ?Social History  ? ?Socioeconomic History  ? Marital status: Single  ?  Spouse name: Not on file  ? Number of children: Not on file  ? Years of education: Not on file  ? Highest education level: Not on file  ?Occupational History  ? Not on file  ?Tobacco Use  ? Smoking status: Never  ? Smokeless tobacco: Never  ?Vaping Use  ? Vaping Use: Never used  ?Substance and Sexual Activity  ? Alcohol use: Never  ? Drug use: Never  ? Sexual activity: Not on file  ?Other Topics Concern  ? Not on file  ?Social History Narrative  ? Not on file  ? ?Social Determinants of Health  ? ?Financial Resource Strain: Not on file  ?Food Insecurity: Not on file  ?Transportation Needs: Not on file  ?Physical Activity: Not on file  ?Stress: Not on file  ?Social Connections: Not on file  ?Intimate Partner Violence: Not on file   ? ? ?ALLERGIES:  ? ? ?Allergies  ?Allergen Reactions  ? Ciprofloxacin Other (See Comments) and Nausea And Vomiting  ?  Muscle aches ?Muscle aches ?Muscle aches ?  ? Buprenorphine Hcl Nausea And Vomiting  ? Solifenacin Itching and Other (See Comments)  ?  Other reaction(s): Other ?Other reaction(s): Other ?Other reaction(s): Other ?  ? ? ?CURRENT MEDICATIONS:  ? ? ?Current Facility-Administered Medications  ?Medication Dose Route Frequency Provider Last Rate Last Admin  ? acetaminophen (TYLENOL) tablet 650 mg  650 mg Oral Q6H PRN Mansy, Jan A, MD      ? Or  ? acetaminophen (TYLENOL) suppository 650 mg  650 mg Rectal Q6H PRN Mansy, Jan A, MD      ? Chlorhexidine Gluconate Cloth 2 % PADS 6 each  6 each Topical Q0600 Colon Flattery, NP   6 each at 10/13/21 0523  ? escitalopram (LEXAPRO) tablet 5 mg  5 mg Oral Daily Patrecia Pour, NP      ? famotidine (PEPCID) tablet 20 mg  20 mg Oral Daily Mansy, Jan A, MD   20 mg at 10/13/21 1057  ? feeding supplement (ENSURE ENLIVE / ENSURE PLUS) liquid 237 mL  237 mL Oral TID BM Barb Merino, MD      ? furosemide (LASIX) tablet 80 mg  80 mg Oral BID Mansy, Arvella Merles, MD  80 mg at 10/13/21 1103  ? hydrOXYzine (ATARAX) tablet 10 mg  10 mg Oral Daily PRN Patrecia Pour, NP      ? insulin aspart (novoLOG) injection 0-5 Units  0-5 Units Subcutaneous QHS Barb Merino, MD      ? insulin aspart (novoLOG) injection 0-9 Units  0-9 Units Subcutaneous TID WC Barb Merino, MD      ? insulin glargine-yfgn (SEMGLEE) injection 4 Units  4 Units Subcutaneous Q breakfast Renda Rolls, RPH      ? isosorbide-hydrALAZINE (BIDIL) 20-37.5 MG per tablet 1 tablet  1 tablet Oral BID Mansy, Jan A, MD   1 tablet at 10/13/21 1057  ? latanoprost (XALATAN) 0.005 % ophthalmic solution 1 drop  1 drop Both Eyes QHS Mansy, Jan A, MD   1 drop at 10/12/21 2318  ? lidocaine (LIDODERM) 5 % 1 patch  1 patch Transdermal Q24H Mansy, Jan A, MD      ? loperamide (IMODIUM) capsule 2 mg  2 mg Oral Q4H PRN Barb Merino, MD   2 mg at 10/13/21 1253  ? megestrol (MEGACE) 400 MG/10ML suspension 625 mg  625 mg Oral Daily Mansy, Jan A, MD   625 mg at 10/13/21 1056  ? melatonin tablet 2.5 mg  2.5 mg Oral QHS Mansy, Jan A, MD   2.5 mg at 10/12/21 0209  ? mirtazapine (REMERON) tablet 7.5 mg  7.5 mg Oral QHS Mansy, Jan A, MD   7.5 mg at 10/12/21 0209  ? [START ON 10/14/2021] multivitamin with minerals tablet 1 tablet  1 tablet Oral Daily Barb Merino, MD      ? nitroGLYCERIN (NITROSTAT) SL tablet 0.4 mg  0.4 mg Sublingual Q5 min PRN Mansy, Jan A, MD      ? ondansetron [facility]) tablet 4 mg  4 mg Oral Q6H PRN Mansy, Jan A, MD      ? Or  ? ondansetron [facility]) injection 4 mg  4 mg Intravenous Q6H PRN Mansy, Jan A, MD      ? pentoxifylline (TRENTAL) CR tablet 400 mg  400 mg Oral Daily Mansy, Jan A, MD   400 mg at 10/13/21 1056  ? predniSONE (DELTASONE) tablet 5 mg  5 mg Oral Q breakfast Mansy, Jan A, MD   5 mg at 10/13/21 1103  ? sacubitril-valsartan (ENTRESTO) 24-26 mg per tablet  1 tablet Oral BID Mansy, Jan A, MD   1 tablet at 10/13/21 1056  ? ? ?REVIEW OF SYSTEMS:  ? ?[X]  denotes positive finding, [ ]  denotes negative finding ?Cardiac  Comments:  ?Chest pain or chest pressure:    ?Shortness of breath upon exertion: x   ?Short of breath when lying flat:    ?Irregular heart rhythm:    ?    ?Vascular    ?Pain in calf, thigh, or hip brought on by ambulation:    ?Pain in feet at night that wakes you up from your sleep:     ?Blood clot in your veins:    ?Leg swelling:     ?    ?Pulmonary    ?Oxygen at home:    ?Productive cough:     ?Wheezing:     ?    ?Neurologic    ?Sudden weakness in arms or legs:     ?Sudden numbness in arms or legs:     ?Sudden onset of difficulty speaking or slurred speech:    ?Temporary loss of vision in one eye:     ?Problems with  dizziness:     ?    ?Gastrointestinal    ?Blood in stool:     ? ?Vomited blood:     ?    ?Genitourinary    ?Burning when urinating:     ?Blood in urine:    ?    ?Psychiatric    ?Major  depression:     ?    ?Hematologic    ?Bleeding problems:    ?Problems with blood clotting too easily:    ?    ?Skin    ?Rashes or ulcers:    ?    ?Constitutional    ?Fever or chills:    ? ?PHYSICAL EXAM:  ? ?Vitals:  ? 10/13/21 0833 10/13/21 1240 10/13/21 1559 10/13/21 1617  ?BP: 128/63 (!) 123/51  (!) 111/55  ?Pulse: 84 89 88 81  ?Resp: 17 17  17   ?Temp: 97.8 ?F (36.6 ?C) 98 ?F (36.7 ?C)  97.9 ?F (36.6 ?C)  ?TempSrc: Oral Oral  Oral  ?SpO2: 100% 100%  100%  ?Weight:      ?Height:      ? ? ?GENERAL: The patient is a well-nourished female, in no acute distress. The vital signs are documented above. ?CARDIAC: There is a regular rate and rhythm.  ?VASCULAR: Pulsatile feeling in right upper arm fistula ?PULMONARY: Nonlabored respirations ?ABDOMEN: Soft and non-tender with normal pitched bowel sounds.  ?NEUROLOGIC: No focal weakness or paresthesias are detected. ?SKIN: There are no ulcers or rashes noted. ?PSYCHIATRIC: The patient has a normal affect. ? ?STUDIES:  ? ?None ? ?ASSESSMENT and PLAN  ? ?Bleeding from right upper arm fistula following dialysis: The patient has a history of stenting within her fistula and has had episodes like this before in the past.  I discussed with her that she likely has a stenosis in the outflow of her fistula and needs to undergo fistulogram to further evaluate this and intervene if any stenosis is identified.  We will try to add her onto the schedule on Monday.  I would hold her Coumadin until after the procedure and check INRs daily.  She will need to be n.p.o. after midnight on Sunday ? ? ?Annamarie Major, IV, MD, FACS ?[facility] [place] ?Tel [phone] ?Pager [phone]  ?

## 2021-10-14 DIAGNOSIS — G7001 Myasthenia gravis with (acute) exacerbation: Secondary | ICD-10-CM | POA: Diagnosis not present

## 2021-10-14 LAB — CBC
HCT: 19.6 % — ABNORMAL LOW (ref 36.0–46.0)
Hemoglobin: 6.2 g/dL — ABNORMAL LOW (ref 12.0–15.0)
MCH: 31.3 pg (ref 26.0–34.0)
MCHC: 31.6 g/dL (ref 30.0–36.0)
MCV: 99 fL (ref 80.0–100.0)
Platelets: 70 10*3/uL — ABNORMAL LOW (ref 150–400)
RBC: 1.98 MIL/uL — ABNORMAL LOW (ref 3.87–5.11)
RDW: 17.5 % — ABNORMAL HIGH (ref 11.5–15.5)
WBC: 4.5 10*3/uL (ref 4.0–10.5)
nRBC: 1.6 % — ABNORMAL HIGH (ref 0.0–0.2)

## 2021-10-14 LAB — RENAL FUNCTION PANEL
Albumin: 2.7 g/dL — ABNORMAL LOW (ref 3.5–5.0)
Anion gap: 7 (ref 5–15)
BUN: 40 mg/dL — ABNORMAL HIGH (ref 8–23)
CO2: 29 mmol/L (ref 22–32)
Calcium: 7.9 mg/dL — ABNORMAL LOW (ref 8.9–10.3)
Chloride: 102 mmol/L (ref 98–111)
Creatinine, Ser: 4.79 mg/dL — ABNORMAL HIGH (ref 0.44–1.00)
GFR, Estimated: 9 mL/min — ABNORMAL LOW (ref >60)
Glucose, Bld: 101 mg/dL — ABNORMAL HIGH (ref 70–99)
Phosphorus: 4.5 mg/dL (ref 2.5–4.6)
Potassium: 3.7 mmol/L (ref 3.5–5.1)
Sodium: 138 mmol/L (ref 135–145)

## 2021-10-14 LAB — GLUCOSE, CAPILLARY
Glucose-Capillary: 108 mg/dL — ABNORMAL HIGH (ref 70–99)
Glucose-Capillary: 127 mg/dL — ABNORMAL HIGH (ref 70–99)
Glucose-Capillary: 146 mg/dL — ABNORMAL HIGH (ref 70–99)
Glucose-Capillary: 121 mg/dL — ABNORMAL HIGH (ref 70–99)

## 2021-10-14 LAB — PREPARE RBC (CROSSMATCH)

## 2021-10-14 LAB — ABO/RH: ABO/RH(D): O NEG

## 2021-10-14 LAB — PROTIME-INR
INR: 1.8 — ABNORMAL HIGH (ref 0.8–1.2)
Prothrombin Time: 20.7 seconds — ABNORMAL HIGH (ref 11.4–15.2)

## 2021-10-14 MED ORDER — APIXABAN 5 MG PO TABS: 5.0000 mg | ORAL_TABLET | Freq: Two times a day (BID) | ORAL | Status: DC

## 2021-10-14 MED ORDER — SODIUM CHLORIDE 0.9% IV SOLUTION: Freq: Once | INTRAVENOUS | Status: DC

## 2021-10-14 NOTE — H&P (View-Only) (Signed)
INR is 1.8 today.  Schedule permitting, we will proceed with right arm fistulogram tomorrow.  She will be n.p.o. after midnight. ? ?Tammie Klein ?

## 2021-10-14 NOTE — Progress Notes (Signed)
Subjective: ?The patient had bleeding from her right upper arm fistula following dialysis yesterday. Vascular Surgery suspects stenosis in the outflow of her fistula that needs to be assessed with fistulogram, and to intervene if any stenosis is identified.  Vascular Surgery note from yesterday states that they will try to add her onto the schedule on Monday.  Coumadin to be held until after the procedure and check INRs daily.  She will need to be n.p.o. after midnight. ? ?Objective: ?Current vital signs: ?BP 124/65 (BP Location: Left Arm)   Pulse 82   Temp 97.9 ?F (36.6 ?C)   Resp 16   Ht 5\' 3"  (1.6 m)   Wt 51.3 kg   SpO2 99%   BMI 20.03 kg/m?  ?Vital signs in last 24 hours: ?Temp:  [97.8 ?F (36.6 ?C)-98.4 ?F (36.9 ?C)] 97.9 ?F (36.6 ?C) (04/23 1740) ?Pulse Rate:  [81-89] 82 (04/23 0731) ?Resp:  [16-21] 16 (04/23 0731) ?BP: (102-128)/(51-65) 124/65 (04/23 0731) ?SpO2:  [99 %-100 %] 99 % (04/23 0731) ?Weight:  [51.3 kg] 51.3 kg (04/23 0435) ? ?Intake/Output from previous day: ?04/22 0701 - 04/23 0700 ?In: 160 [P.O.:160] ?Out: -  ?Intake/Output this shift: ?No intake/output data recorded. ?Nutritional status:  ?Diet Order   ? ?       ?  Diet regular Room service appropriate? Yes; Fluid consistency: Thin  Diet effective now       ?  ? ?  ?  ? ?  ? ?Physical Exam  ?HEENT-  [place]/AT ?Lungs- Respirations unlabored ?Extremities- Edema has resolved ?  ?  ?Neurological Examination ?Mental Status: Alert and oriented, thought content appropriate. Fatigued appearing. Speech fluent without evidence of aphasia.  Able to follow all commands without difficulty. ?Cranial Nerves: ?II: Fixates and tracks normally.   ?III,IV, VI: Mild bilateral ptosis. EOMI with ability to fully bury sclerae on horizontal gaze and no fatiguability of extraocular muscles with upgaze, except for mild transient esotropia when eyes return back to the midline after sustained upgaze.  ?VII: Smile symmetric. Transverse smile seen at last exam is  resolved. ?VIII: Hearing intact to voice ?IX,X: No hoarseness or hypophonia ?XI: Head is midline ?XII: Midline tongue extension ?Motor: ?BUE: 4/5 BUE proximally and distally without asymmetry ?BLE BLE are stronger than previous exam, but still weak with 4/5 hip flexion, knee flexion and knee extension on the left, 4-/5 for these modalities on the right. ADF/APF 4+/5 bilaterally .  ?Cerebellar: No ataxia noted  ?Gait: Deferred ? ?Lab Results: ?Results for orders placed or performed during the hospital encounter of 10/11/21 (from the past 48 hour(s))  ?Hepatitis B surface antigen     Status: None  ? Collection Time: 10/12/21 11:11 AM  ?Result Value Ref Range  ? Hepatitis B Surface Ag NON REACTIVE NON REACTIVE  ?  Comment: Performed at [facility], [place] [address] [place], [place] [address]  ?Renal function panel     Status: Abnormal  ? Collection Time: 10/12/21 11:40 AM  ?Result Value Ref Range  ? Sodium 140 135 - 145 mmol/L  ? Potassium 4.7 3.5 - 5.1 mmol/L  ? Chloride 102 98 - 111 mmol/L  ? CO2 21 (L) 22 - 32 mmol/L  ? Glucose, Bld 102 (H) 70 - 99 mg/dL  ?  Comment: Glucose reference range applies only to samples taken after fasting for at least 8 hours.  ? BUN 50 (H) 8 - 23 mg/dL  ? Creatinine, Ser 5.28 (H) 0.44 - 1.00 mg/dL  ?  Calcium 8.0 (L) 8.9 - 10.3 mg/dL  ? Phosphorus 4.6 2.5 - 4.6 mg/dL  ? Albumin 3.2 (L) 3.5 - 5.0 g/dL  ? GFR, Estimated 8 (L) >60 mL/min  ?  Comment: (NOTE) ?Calculated using the CKD-EPI Creatinine Equation (2021) ?  ? Anion gap 17 (H) 5 - 15  ?  Comment: Performed at [facility], [address] [place], [place] [address]  ?CBC     Status: Abnormal  ? Collection Time: 10/12/21 11:40 AM  ?Result Value Ref Range  ? WBC 5.5 4.0 - 10.5 K/uL  ? RBC 2.42 (L) 3.87 - 5.11 MIL/uL  ? Hemoglobin 7.6 (L) 12.0 - 15.0 g/dL  ? HCT 23.3 (L) 36.0 - 46.0 %  ? MCV 96.3 80.0 - 100.0 fL  ? MCH 31.4 26.0 - 34.0 pg  ? MCHC 32.6 30.0 - 36.0 g/dL  ? RDW 17.2 (H) 11.5 - 15.5 %  ? Platelets 68 (L)  150 - 400 K/uL  ?  Comment: Immature Platelet Fraction may be ?clinically indicated, consider ?ordering this additional test ?[id number] ?  ? nRBC 2.9 (H) 0.0 - 0.2 %  ?  Comment: Performed at [facility], [address] [place], [place] [address]  ?Hepatitis B surface antibody     Status: Abnormal  ? Collection Time: 10/12/21 11:40 AM  ?Result Value Ref Range  ? Hep B S Ab Reactive (A) NON REACTIVE  ?  Comment: (NOTE) ?Consistent with immunity, greater than 9.9 mIU/mL. ? ?Performed at [facility], [place] [address], [place], [place] ?[address] ?  ?Hepatitis B surface antibody,quantitative     Status: None  ? Collection Time: 10/12/21 11:40 AM  ?Result Value Ref Range  ? Hepatitis B-Post 569.5 Immunity>9.9 mIU/mL  ?  Comment: (NOTE) ? Status of Immunity                     Anti-HBs Level ? ------------------                     -------------- ?Inconsistent with Immunity                   0.0 - 9.9 ?Consistent with Immunity                          >9.9 ?Performed At: [facility] ?[address] [place], [place] [id number] ?Rush Farmer MD UV:[id number] ?  ?Hemoglobin A1c     Status: Abnormal  ? Collection Time: 10/12/21 12:13 PM  ?Result Value Ref Range  ? Hgb A1c MFr Bld 6.0 (H) 4.8 - 5.6 %  ?  Comment: (NOTE) ?Pre diabetes:          5.7%-6.4% ? ?Diabetes:              >6.4% ? ?Glycemic control for   <7.0% ?adults with diabetes ?  ? Mean Plasma Glucose 125.5 mg/dL  ?  Comment: Performed at [facility], [place] [address] [place], [place] [address]  ?Protime-INR     Status: Abnormal  ? Collection Time: 10/12/21 12:30 PM  ?Result Value Ref Range  ? Prothrombin Time 23.7 (H) 11.4 - 15.2 seconds  ? INR 2.1 (H) 0.8 - 1.2  ?  Comment: (NOTE) ?INR goal varies based on device and disease states. ?Performed at [facility], [place], ?[place] [address] ?  ?Glucose, capillary     Status: Abnormal  ?  Collection Time: 10/12/21  9:17 PM  ?Result Value Ref Range  ?  Glucose-Capillary 64 (L) 70 - 99 mg/dL  ?  Comment: Glucose reference range applies only to samples taken after fasting for at least 8 hours.  ?Glucose, capillary     Status: None  ? Collection Time: 10/12/21 10:00 PM  ?Result Value Ref Range  ? Glucose-Capillary 80 70 - 99 mg/dL  ?  Comment: Glucose reference range applies only to samples taken after fasting for at least 8 hours.  ?Glucose, capillary     Status: None  ? Collection Time: 10/13/21 12:14 AM  ?Result Value Ref Range  ? Glucose-Capillary 76 70 - 99 mg/dL  ?  Comment: Glucose reference range applies only to samples taken after fasting for at least 8 hours.  ?Glucose, capillary     Status: Abnormal  ? Collection Time: 10/13/21  4:35 AM  ?Result Value Ref Range  ? Glucose-Capillary 69 (L) 70 - 99 mg/dL  ?  Comment: Glucose reference range applies only to samples taken after fasting for at least 8 hours.  ?HIV Antibody (routine testing w rflx)     Status: None  ? Collection Time: 10/13/21  4:38 AM  ?Result Value Ref Range  ? HIV Screen 4th Generation wRfx Non Reactive Non Reactive  ?  Comment: Performed at [facility], [place] [address], [place], [place] [address]  ?Protime-INR     Status: Abnormal  ? Collection Time: 10/13/21  4:38 AM  ?Result Value Ref Range  ? Prothrombin Time 23.8 (H) 11.4 - 15.2 seconds  ? INR 2.1 (H) 0.8 - 1.2  ?  Comment: (NOTE) ?INR goal varies based on device and disease states. ?Performed at [facility], [place], ?[place] [address] ?  ?Glucose, capillary     Status: Abnormal  ? Collection Time: 10/13/21  5:21 AM  ?Result Value Ref Range  ? Glucose-Capillary 104 (H) 70 - 99 mg/dL  ?  Comment: Glucose reference range applies only to samples taken after fasting for at least 8 hours.  ?Glucose, capillary     Status: None  ? Collection Time: 10/13/21  8:34 AM  ?Result Value Ref Range  ? Glucose-Capillary 79 70 - 99 mg/dL  ?  Comment: Glucose reference range applies only to samples taken after fasting for  at least 8 hours.  ?Glucose, capillary     Status: Abnormal  ? Collection Time: 10/13/21 12:37 PM  ?Result Value Ref Range  ? Glucose-Capillary 117 (H) 70 - 99 mg/dL  ?  Comment: Glucose reference range applies only to samples

## 2021-10-14 NOTE — Progress Notes (Signed)
?[facility]  ?ROUNDING NOTE  ? ?Subjective:  ? ?Tammie Klein is a 71 year old female with past medical histories including chronic heart failure, diabetes, hypertension, myasthenia gravis, and end-stage renal disease on hemodialysis.  Patient presents to the emergency department with shortness of breath and lower extremity weakness.  Patient has been admitted for Myasthenia gravis with acute exacerbation ([place]) [G70.01] ?Myasthenia gravis with (acute) exacerbation ([place]) [G70.01] ? ?Patient was recently moved to the area from [place] to live with her brother.  Patient recently started receiving outpatient dialysis treatments at [facility], on a Monday Wednesday Friday schedule.  ? ?Update ?Patient seen ambulating in hall with physical therapy ?Weakness remains and requires frequent rest breaks ?Appetite remains poor.  ? ? ?Objective:  ?Vital signs in last 24 hours:  ?Temp:  [97.9 ?F (36.6 ?C)-98.4 ?F (36.9 ?C)] 97.9 ?F (36.6 ?C) (04/23 9163) ?Pulse Rate:  [81-89] 82 (04/23 0731) ?Resp:  [16-21] 16 (04/23 0731) ?BP: (102-125)/(51-65) 124/65 (04/23 0731) ?SpO2:  [99 %-100 %] 99 % (04/23 0731) ?Weight:  [51.3 kg] 51.3 kg (04/23 0435) ? ?Weight change: -0.9 kg ?Filed Weights  ? 10/12/21 1457 10/13/21 0525 10/14/21 0435  ?Weight: 55.8 kg 51.5 kg 51.3 kg  ? ? ?Intake/Output: ?I/O last 3 completed shifts: ?In: 160 [P.O.:160] ?Out: -  ?  ?Intake/Output this shift: ? Total I/O ?In: 120 [P.O.:120] ?Out: -  ? ?Physical Exam: ?General: NAD  ?Head: Normocephalic, atraumatic. Moist oral mucosal membranes  ?Eyes: Anicteric  ?Lungs:  Clear to auscultation, normal effort, room air  ?Heart: Regular rate and rhythm  ?Abdomen:  Soft, nontender, nondistended  ?Extremities: 1+ peripheral edema.  ?Neurologic: Nonfocal, moving all four extremities  ?Skin: No lesions  ?Access: Left aVF  ? ? ?Basic Metabolic Panel: ?Recent Labs  ?Lab 10/11/21 ?2034 10/12/21 ?8466 10/12/21 ?1140  ?NA 139 141 140  ?K 4.7 4.4 4.7  ?CL 100  104 102  ?CO2 24 22 21*  ?GLUCOSE 108* 86 102*  ?BUN 41* 45* 50*  ?CREATININE 4.91* 4.90* 5.28*  ?CALCIUM 8.2* 7.9* 8.0*  ?PHOS  --   --  4.6  ? ? ? ?Liver Function Tests: ?Recent Labs  ?Lab 10/12/21 ?1140  ?ALBUMIN 3.2*  ? ? ?No results for input(s): LIPASE, AMYLASE in the last 168 hours. ?No results for input(s): AMMONIA in the last 168 hours. ? ?CBC: ?Recent Labs  ?Lab 10/11/21 ?1848 10/12/21 ?5993 10/12/21 ?1140  ?WBC 5.6 5.5 5.5  ?HGB 8.3* 7.4* 7.6*  ?HCT 26.4* 24.2* 23.3*  ?MCV 98.9 100.0 96.3  ?PLT 86* 79* 68*  ? ? ? ?Cardiac Enzymes: ?Recent Labs  ?Lab 10/11/21 ?2034  ?CKTOTAL 93  ? ? ? ?BNP: ?Invalid input(s): POCBNP ? ?CBG: ?Recent Labs  ?Lab 10/13/21 ?5701 10/13/21 ?1237 10/13/21 ?1618 10/13/21 ?2035 10/14/21 ?0731  ?GLUCAP 79 117* 144* 118* 108*  ? ? ? ?Microbiology: ?Results for orders placed or performed during the hospital encounter of 10/11/21  ?Resp Panel by RT-PCR (Flu A&B, Covid) Nasopharyngeal Swab     Status: None  ? Collection Time: 10/11/21  8:34 PM  ? Specimen: Nasopharyngeal Swab; Nasopharyngeal(NP) swabs in vial transport medium  ?Result Value Ref Range Status  ? SARS Coronavirus 2 by RT PCR NEGATIVE NEGATIVE Final  ?  Comment: (NOTE) ?SARS-CoV-2 target nucleic acids are NOT DETECTED. ? ?The SARS-CoV-2 RNA is generally detectable in upper respiratory ?specimens during the acute phase of infection. The lowest ?concentration of SARS-CoV-2 viral copies this assay can detect is ?138 copies/mL. A negative result  does not preclude SARS-Cov-2 ?infection and should not be used as the sole basis for treatment or ?other patient management decisions. A negative result may occur with  ?improper specimen collection/handling, submission of specimen other ?than nasopharyngeal swab, presence of viral mutation(s) within the ?areas targeted by this assay, and inadequate number of viral ?copies(<138 copies/mL). A negative result must be combined with ?clinical observations, patient history, and  epidemiological ?information. The expected result is Negative. ? ?Fact Sheet for Patients:  ?EntrepreneurPulse.com.au ? ?Fact Sheet for Healthcare Providers:  ?IncredibleEmployment.be ? ?This test is no t yet approved or cleared by the [place] FDA and  ?has been authorized for detection and/or diagnosis of SARS-CoV-2 by ?FDA under an Emergency Use Authorization (EUA). This EUA will remain  ?in effect (meaning this test can be used) for the duration of the ?COVID-19 declaration under Section 564(b)(1) of the Act, 21 ?U.S.C.section 360bbb-3(b)(1), unless the authorization is terminated  ?or revoked sooner.  ? ? ?  ? Influenza A by PCR NEGATIVE NEGATIVE Final  ? Influenza B by PCR NEGATIVE NEGATIVE Final  ?  Comment: (NOTE) ?The Xpert Xpress SARS-CoV-2/FLU/RSV plus assay is intended as an aid ?in the diagnosis of influenza from Nasopharyngeal swab specimens and ?should not be used as a sole basis for treatment. Nasal washings and ?aspirates are unacceptable for Xpert Xpress SARS-CoV-2/FLU/RSV ?testing. ? ?Fact Sheet for Patients: ?EntrepreneurPulse.com.au ? ?Fact Sheet for Healthcare Providers: ?IncredibleEmployment.be ? ?This test is not yet approved or cleared by the [place] FDA and ?has been authorized for detection and/or diagnosis of SARS-CoV-2 by ?FDA under an Emergency Use Authorization (EUA). This EUA will remain ?in effect (meaning this test can be used) for the duration of the ?COVID-19 declaration under Section 564(b)(1) of the Act, 21 U.S.C. ?section 360bbb-3(b)(1), unless the authorization is terminated or ?revoked. ? ?Performed at [facility], [place], ?[place] [address] ?  ? ? ?Coagulation Studies: ?Recent Labs  ?  10/12/21 ?1230 10/13/21 ?0438 10/14/21 ?0426  ?LABPROT 23.7* 23.8* 20.7*  ?INR 2.1* 2.1* 1.8*  ? ? ? ?Urinalysis: ?No results for input(s): COLORURINE, LABSPEC, [place], GLUCOSEU,  HGBUR, BILIRUBINUR, KETONESUR, PROTEINUR, UROBILINOGEN, NITRITE, LEUKOCYTESUR in the last 72 hours. ? ?Invalid input(s): APPERANCEUR  ? ? ?Imaging: ?No results found. ? ? ?Medications:  ? ? ? ? apixaban  5 mg Oral BID  ? Chlorhexidine Gluconate Cloth  6 each Topical Q0600  ? escitalopram  5 mg Oral Daily  ? famotidine  20 mg Oral Daily  ? feeding supplement  237 mL Oral TID BM  ? furosemide  80 mg Oral BID  ? insulin aspart  0-5 Units Subcutaneous QHS  ? insulin aspart  0-9 Units Subcutaneous TID WC  ? insulin glargine-yfgn  4 Units Subcutaneous Q breakfast  ? isosorbide-hydrALAZINE  1 tablet Oral BID  ? latanoprost  1 drop Both Eyes QHS  ? lidocaine  1 patch Transdermal Q24H  ? megestrol  625 mg Oral Daily  ? melatonin  2.5 mg Oral QHS  ? mirtazapine  7.5 mg Oral QHS  ? multivitamin with minerals  1 tablet Oral Daily  ? pentoxifylline  400 mg Oral Daily  ? predniSONE  5 mg Oral Q breakfast  ? sacubitril-valsartan  1 tablet Oral BID  ? ?acetaminophen **OR** acetaminophen, hydrOXYzine, loperamide, nitroGLYCERIN, ondansetron **OR** ondansetron (ZOFRAN) IV ? ?Assessment/ Plan:  ?Ms. Lonnie Rosado is a 71 y.o.  female with past medical histories including chronic heart failure, diabetes, hypertension, myasthenia gravis, and  end-stage renal disease on hemodialysis.  Patient presents to the emergency department with shortness of breath and lower extremity weakness.  Patient has been admitted for Myasthenia gravis with acute exacerbation ([place]) [G70.01] ?Myasthenia gravis with (acute) exacerbation ([place]) [G70.01] ? ?[facility]/MWF/left aVF ? ?End-stage renal disease on hemodialysis.  Will maintain outpatient schedule, if possible.  Plan for next treatment on Monday. Dialysis coordinator monitoring discharge plan to assess any outpatient needs.  ? ?2. Anemia of chronic kidney disease ?Lab Results  ?Component Value Date  ? HGB 7.6 (L) 10/12/2021  ?Mircera received outpatient biweekly.  Hgb below target. Will  continue to monitor ? ?3. Secondary Hyperparathyroidism:  ?Lab Results  ?Component Value Date  ? CALCIUM 8.0 (L) 10/12/2021  ? PHOS 4.6 10/12/2021  ?Will continue to monitor bone minerals during this admission. Phosphorus wit

## 2021-10-14 NOTE — Progress Notes (Signed)
Best of three attempts ? ?NIF -24 ? ?FVC - 1.06L ?FEV1  - 0.83L ?FEV1/FVC - 78.4% ?PEF - 3.54L/s ?

## 2021-10-14 NOTE — Progress Notes (Signed)
Physical Therapy Treatment ?Patient Details ?Name: Tammie Klein ?MRN: [id number] ?DOB: 09/27/1950 ?Today's Date: 10/14/2021 ? ? ?History of Present Illness Pt is a 71 y.o. female presenting to hospital 4/20 with c/o SOB and swelling to legs; gradually worsening B LE weakness over past several days; deteriorating health over last several months.  Recently brought to area from home in [place] by her family so pt could be cared for by family here.  Pt admitted with myasthenia gravis with acute exacerbation.  PMH includes myasthenia gravis, ESRD on HD MWF, CHF, DM, htn. ? ?  ?PT Comments  ? ? Pt in bed, wanting to get up and walk.  HgB is 6.2 but she stated she feels ok and wants to move.  She is able to get to EOB and walk 50' before requesting to sit in chair in hallway before retuning to room and opts to stay in chair.  Pt asymptomatic beyond normal c/o fatigue with activity. Encouragement provided.   ?  ?Recommendations for follow up therapy are one component of a multi-disciplinary discharge planning process, led by the attending physician.  Recommendations may be updated based on patient status, additional functional criteria and insurance authorization. ? ?Follow Up Recommendations ? Skilled nursing-short term rehab (<3 hours/day) ?  ?  ?Assistance Recommended at Discharge Frequent or constant Supervision/Assistance  ?Patient can return home with the following A little help with walking and/or transfers;A little help with bathing/dressing/bathroom;Assistance with cooking/housework;Help with stairs or ramp for entrance;Assist for transportation ?  ?Equipment Recommendations ? Rolling walker (2 wheels);BSC/3in1;Wheelchair (measurements PT);Wheelchair cushion (measurements PT)  ?  ?Recommendations for Other Services   ? ? ?  ?Precautions / Restrictions Precautions ?Precautions: Fall ?Restrictions ?Weight Bearing Restrictions: No  ?  ? ?Mobility ? Bed Mobility ?Overal bed mobility: Needs Assistance ?Bed Mobility:  Supine to Sit ?  ?  ?Supine to sit: Min guard ?  ?  ?  ?  ? ?Transfers ?Overall transfer level: Needs assistance ?Equipment used: Rolling walker (2 wheels) ?Transfers: Sit to/from Stand ?Sit to Stand: Min guard ?  ?  ?  ?  ?  ?  ?  ? ?Ambulation/Gait ?Ambulation/Gait assistance: Min guard ?Gait Distance (Feet): 50 Feet ?Assistive device: Rolling walker (2 wheels) ?Gait Pattern/deviations: Step-through pattern, Decreased step length - right, Decreased step length - left, Narrow base of support ?Gait velocity: decreased ?  ?  ?General Gait Details: 47' 20' ? ? ?Stairs ?  ?  ?  ?  ?  ? ? ?Wheelchair Mobility ?  ? ?Modified Rankin (Stroke Patients Only) ?  ? ? ?  ?Balance Overall balance assessment: Needs assistance ?Sitting-balance support: No upper extremity supported, Feet supported ?Sitting balance-Leahy Scale: Good ?  ?  ?Standing balance support: Single extremity supported, Reliant on assistive device for balance, During functional activity ?Standing balance-Leahy Scale: Fair ?  ?  ?  ?  ?  ?  ?  ?  ?  ?  ?  ?  ?  ? ?  ?Cognition Arousal/Alertness: Awake/alert ?Behavior During Therapy: Flat affect, WFL for tasks assessed/performed ?  ?  ?  ?  ?  ?  ?  ?  ?  ?  ?  ?  ?  ?  ?  ?  ?  ?General Comments: oriented to person, place, situation; not oriented to date ?  ?  ? ?  ?Exercises   ? ?  ?General Comments   ?  ?  ? ?Pertinent Vitals/Pain Pain Assessment ?Pain Assessment: No/denies  pain  ? ? ?Home Living   ?  ?  ?  ?  ?  ?  ?  ?  ?  ?   ?  ?Prior Function    ?  ?  ?   ? ?PT Goals (current goals can now be found in the care plan section) Progress towards PT goals: Progressing toward goals ? ?  ?Frequency ? ? ? Min 2X/week ? ? ? ?  ?PT Plan Discharge plan needs to be updated  ? ? ?Co-evaluation   ?  ?  ?  ?  ? ?  ?AM-PAC PT "6 Clicks" Mobility   ?Outcome Measure ? Help needed turning from your back to your side while in a flat bed without using bedrails?: None ?Help needed moving from lying on your back to sitting on  the side of a flat bed without using bedrails?: None ?Help needed moving to and from a bed to a chair (including a wheelchair)?: A Little ?Help needed standing up from a chair using your arms (e.g., wheelchair or bedside chair)?: A Little ?Help needed to walk in hospital room?: A Little ?Help needed climbing 3-5 steps with a railing? : A Lot ?6 Click Score: 19 ? ?  ?End of Session Equipment Utilized During Treatment: Gait belt ?Activity Tolerance: Patient limited by fatigue;Other (comment) (Limited d/t c/o drowsiness) ?Patient left: in bed;with call bell/phone within reach;with bed alarm set ?Nurse Communication: Mobility status;Precautions ?PT Visit Diagnosis: Muscle weakness (generalized) (M62.81);History of falling (Z91.81);Other abnormalities of gait and mobility (R26.89) ?  ? ? ?Time: 9562-1308 ?PT Time Calculation (min) (ACUTE ONLY): 13 min ? ?Charges:  $Gait Training: 8-22 mins          ?         Chesley Noon, PTA ?10/14/21, 12:19 PM ? ?

## 2021-10-14 NOTE — Progress Notes (Signed)
Made Dr. Reesa Chew aware current hgb is 6.2. Also questioned if new eliquis order needs to be held. Patient was on coumadin which was being held due to bleeding ?

## 2021-10-14 NOTE — [place] FL2 (Signed)
?[place] MEDICAID FL2 LEVEL OF CARE SCREENING TOOL  ?  ? ?IDENTIFICATION  ?Patient Name: ?Tammie Klein Birthdate: Feb 28, 1951 Sex: female Admission Date (Current Location): ?10/11/2021  ?[place] and [place] Number: ? [place] ?  Facility and Address:  ?[facility], [address] [place], [place] [address] ?     Provider Number: ?[id number]  ?Attending Physician Name and Address:  ?Damita Lack, MD ? Relative Name and Phone Number:  ?Leanor Rubenstein) [phone] ?   ?Current Level of Care: ?Hospital Recommended Level of Care: ?[place] Prior Approval Number: ?  ? ?Date Approved/Denied: ?  PASRR Number: ?[id number] A ? ?Discharge Plan: ?SNF ?  ? ?Current Diagnoses: ?Patient Active Problem List  ? Diagnosis Date Noted  ? Major depressive disorder, recurrent episode, moderate ([place]) 10/13/2021  ? End-stage renal disease on hemodialysis ([place]) 10/12/2021  ? Dyslipidemia 10/12/2021  ? Essential hypertension 10/12/2021  ? Chronic systolic CHF (congestive heart failure) ([place]) 10/12/2021  ? Myasthenia gravis with (acute) exacerbation ([place]) 10/11/2021  ? ? ?Orientation RESPIRATION BLADDER Height & Weight   ?  ?Self, Time, Situation, Place ? Normal Continent Weight: 113 lb 1.5 oz (51.3 kg) ?Height:  5\' 3"  (160 cm)  ?BEHAVIORAL SYMPTOMS/MOOD NEUROLOGICAL BOWEL NUTRITION STATUS  ?    Continent Diet (See discharge summary)  ?AMBULATORY STATUS COMMUNICATION OF NEEDS Skin   ?Extensive Assist Verbally Normal ?  ?  ?  ?    ?     ?     ? ? ?Personal Care Assistance Level of Assistance  ?Bathing, Feeding, Dressing Bathing Assistance: Maximum assistance ?Feeding assistance: Independent ?Dressing Assistance: Maximum assistance ?   ? ?Functional Limitations Info  ?Hearing, Speech, Sight Sight Info: Adequate ?Hearing Info: Adequate ?Speech Info: Adequate  ? ? ?SPECIAL CARE FACTORS FREQUENCY  ?PT (By licensed PT), OT (By licensed OT)   ?  ?PT Frequency: 2X min weekly ?OT Frequency: 2X min  weekly ?  ?  ?  ?   ? ? ?Contractures Contractures Info: Not present  ? ? ?Additional Factors Info  ?Code Status, Allergies Code Status Info: Full ?Allergies Info: Ciprofloxacin, Buprenorphine Hcl, Solifenacin ?  ?  ?  ?   ? ?Current Medications (10/14/2021):  This is the current hospital active medication list ?Current Facility-Administered Medications  ?Medication Dose Route Frequency Provider Last Rate Last Admin  ? 0.9 %  sodium chloride infusion (Manually program via Guardrails IV Fluids)   Intravenous Once Amin, Ankit Chirag, MD      ? acetaminophen (TYLENOL) tablet 650 mg  650 mg Oral Q6H PRN Mansy, Jan A, MD      ? Or  ? acetaminophen (TYLENOL) suppository 650 mg  650 mg Rectal Q6H PRN Mansy, Jan A, MD      ? Chlorhexidine Gluconate Cloth 2 % PADS 6 each  6 each Topical Q0600 Colon Flattery, NP   6 each at 10/14/21 0443  ? escitalopram (LEXAPRO) tablet 5 mg  5 mg Oral Daily Patrecia Pour, NP   5 mg at 10/14/21 2947  ? famotidine (PEPCID) tablet 20 mg  20 mg Oral Daily Mansy, Jan A, MD   20 mg at 10/14/21 6546  ? feeding supplement (ENSURE ENLIVE / ENSURE PLUS) liquid 237 mL  237 mL Oral TID BM Barb Merino, MD      ? furosemide (LASIX) tablet 80 mg  80 mg Oral BID Mansy, Jan A, MD   80 mg at 10/14/21 5035  ? hydrOXYzine (ATARAX) tablet 10 mg  10 mg Oral Daily PRN Patrecia Pour, NP      ? insulin aspart (novoLOG) injection 0-5 Units  0-5 Units Subcutaneous QHS Barb Merino, MD      ? insulin aspart (novoLOG) injection 0-9 Units  0-9 Units Subcutaneous TID WC Barb Merino, MD   1 Units at 10/13/21 1858  ? insulin glargine-yfgn (SEMGLEE) injection 4 Units  4 Units Subcutaneous Q breakfast Renda Rolls, RPH   4 Units at 10/14/21 1275  ? isosorbide-hydrALAZINE (BIDIL) 20-37.5 MG per tablet 1 tablet  1 tablet Oral BID Mansy, Arvella Merles, MD   1 tablet at 10/14/21 0853  ? latanoprost (XALATAN) 0.005 % ophthalmic solution 1 drop  1 drop Both Eyes QHS Mansy, Jan A, MD   1 drop at 10/13/21 2127  ? lidocaine  (LIDODERM) 5 % 1 patch  1 patch Transdermal Q24H Mansy, Jan A, MD      ? loperamide (IMODIUM) capsule 2 mg  2 mg Oral Q4H PRN Barb Merino, MD   2 mg at 10/13/21 1253  ? megestrol (MEGACE) 400 MG/10ML suspension 625 mg  625 mg Oral Daily Mansy, Jan A, MD   625 mg at 10/14/21 1700  ? melatonin tablet 2.5 mg  2.5 mg Oral QHS Mansy, Jan A, MD   2.5 mg at 10/12/21 0209  ? mirtazapine (REMERON) tablet 7.5 mg  7.5 mg Oral QHS Mansy, Jan A, MD   7.5 mg at 10/13/21 2128  ? multivitamin with minerals tablet 1 tablet  1 tablet Oral Daily Barb Merino, MD   1 tablet at 10/14/21 1749  ? nitroGLYCERIN (NITROSTAT) SL tablet 0.4 mg  0.4 mg Sublingual Q5 min PRN Mansy, Jan A, MD      ? ondansetron [facility]) tablet 4 mg  4 mg Oral Q6H PRN Mansy, Jan A, MD      ? Or  ? ondansetron [facility]) injection 4 mg  4 mg Intravenous Q6H PRN Mansy, Jan A, MD      ? pentoxifylline (TRENTAL) CR tablet 400 mg  400 mg Oral Daily Mansy, Jan A, MD   400 mg at 10/14/21 4496  ? predniSONE (DELTASONE) tablet 5 mg  5 mg Oral Q breakfast Mansy, Jan A, MD   5 mg at 10/14/21 7591  ? sacubitril-valsartan (ENTRESTO) 24-26 mg per tablet  1 tablet Oral BID Mansy, Arvella Merles, MD   1 tablet at 10/14/21 6384  ? ? ? ?Discharge Medications: ?Please see discharge summary for a list of discharge medications. ? ?Relevant Imaging Results: ? ?Relevant Lab Results: ? ? ?Additional Information ?SSN# [id number]. Patient goes to dialysis MWF at [facility] ? ?Raina Mina, LCSWA ? ? ? ? ?

## 2021-10-14 NOTE — Progress Notes (Signed)
Per Dr. Reesa Chew okay to hold Eliquis for now ?

## 2021-10-14 NOTE — TOC Transition Note (Signed)
Transition of Care (TOC) - CM/SW Discharge Note ? ? ?Patient Details  ?Name: Tammie Klein ?MRN: [id number] ?Date of Birth: 1950/07/12 ? ?Transition of Care (TOC) CM/SW Contact:  ?Raina Mina, LCSWA ?Phone Number: ?10/14/2021, 3:25 PM ? ? ?Clinical Narrative:   CSW spoke with patient who agree's with SNF placement. FL2 has been sent to provider for signature. CSW will start bed search.  ? ? ? ?  ?Barriers to Discharge: Continued Medical Work up ? ? ?Patient Goals and CMS Choice ?Patient states their goals for this hospitalization and ongoing recovery are:: patient is currently in dialysis ?CMS Medicare.gov Compare Post Acute Care list provided to:: Patient ?Choice offered to / list presented to : Patient, Adult Children ? ?Discharge Placement ?  ?           ?  ?  ?  ?  ? ?Discharge Plan and Services ?  ?Discharge Planning Services: CM Consult ?Post Acute Care Choice: [place]          ?DME Arranged: Youth worker wheelchair with seat cushion ?DME Agency: [employer] ?Date DME Agency Contacted: 10/12/21 ?Time DME Agency Contacted: 2831 ?Representative spoke with at DME Agency: Suanne Marker ?HH Arranged: RN, PT, OT ?  ?  ?  ?  ? ?Social Determinants of Health (SDOH) Interventions ?  ? ? ?Readmission Risk Interventions ?   ? View : No data to display.  ?  ?  ?  ? ? ? ? ? ?

## 2021-10-14 NOTE — Progress Notes (Signed)
?PROGRESS NOTE ? ? ? ?Tammie Klein  HCW:[id number] DOB: [birth year] DOA: 10/11/2021 ?PCP: Pcp, No  ? ?Brief Narrative:  ?71 year old with history of myasthenia gravis, ESRD on hemodialysis MWF, type 2 diabetes and hypertension recently moved from [place] to [place] to live with her son and daughter-in-law brought to ER with shortness of breath and bilateral lower extremity weakness.  Patient complained of shortness of breath worsening over the last few months.  She has occasional cough but no wheezing.  Extremity weakness is ongoing for many weeks, recently worse to the extent she is not able to walk very well.  In the emergency room hemodynamically stable.  Chest x-ray with mild cardiomegaly.  Given 1 L fluid bolus.  Nephrology and neurology consulted and admitted to the hospital. 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  She also has history of carotid artery stenosis. ? ? ?Assessment & Plan: ? Principal Problem: ?  Myasthenia gravis with (acute) exacerbation ([place]) ?Active Problems: ?  Major depressive disorder, recurrent episode, moderate ([place]) ?  End-stage renal disease on hemodialysis ([place]) ?  Dyslipidemia ?  Essential hypertension ?  Chronic systolic CHF (congestive heart failure) ([place]) ?  ?  ?  ?Bilateral lower extremity weakness and suspected myasthenia gravis with acute exacerbation ?Status post thymectomy ?-At home patient is on prednisone 5 mg 3 times a week.  At this time recommending management of volume overload with dialysis but if not improved, she may receive IVIG for 5 days. ?- Continue home prednisone ?  ?ESRD on hemodialysis: Right upper extremity AV fistula present ?-Followed by nephrology team.  Initially on Coumadin severe bleeding after cannulation, seen by vascular team who is recommending will need fistulogram to evaluate for stenosis and possible intervention on Monday.  In the meantime anticoagulation is on hold. ?-Nephrology team  following ? ?Acute on chronic anemia ?- Hemoglobin down to 6.2.  Anticoagulation currently on hold, 1 unit PRBC transfusion ordered. ?  ?Type 2 diabetes, well controlled ?-Sliding scale and Accu-Cheks  ? ?History of recurrent DVT ?-3 episodes over the last year.  On lifelong blood thinner.  Previous provider changed Eliquis to Coumadin after discussing this with the patient.  Currently on hold due to blood loss anemia and anticipation for fistulogram. ? ?Congestive heart failure with reduced ejection fraction, EF 35% ?- Continue home Lasix 80 mg twice daily, Entresto, BiDil ?Avoiding beta-blocker as it can exacerbate myasthenia gravis ? ?Hyperlipidemia ?- Not on statin due to myasthenia gravis ? ?Osteoarthritis ?- Pain control ? ?Frailty and debility: Continue to work with PT OT.  SNF versus home with home health therapies. ?  ? ? ?DVT prophylaxis: On Hold due to bleeding AV Fistula.  ?Code Status: Full  ?Family Communication:   ? ?Status is: Inpatient ?Remains inpatient appropriate because: Ongoing vascular evaluation ? ? ? ? ? ? ?Subjective: ?Seen and examined at bedside, she is sitting up in the chair, does not have any acute complaints at this time.  She does get some exertional shortness of breath ?Tells me she makes minimal urine with Lasix ? ? ?Examination: ? ?Constitutional: Not in acute distress ?Respiratory: Clear to auscultation bilaterally ?Cardiovascular: Normal sinus rhythm, no rubs ?Abdomen: Nontender nondistended good bowel sounds ?Musculoskeletal: No edema noted ?Skin: No rashes seen ?Neurologic: CN 2-12 grossly intact.  And nonfocal ?Psychiatric: Normal judgment and insight. Alert and oriented x 3. Normal mood. ?Right upper extremity AV fistula ? ? ?Objective: ?Vitals:  ? 10/13/21  2031 10/14/21 0011 10/14/21 0435 10/14/21 0731  ?BP: (!) 102/57 125/63 (!) 114/58 124/65  ?Pulse:  86 82 82  ?Resp: (!) 21 (!) 21 20 16   ?Temp: 98.4 ?F (36.9 ?C) 98.2 ?F (36.8 ?C) 98.2 ?F (36.8 ?C) 97.9 ?F (36.6 ?C)   ?TempSrc: Oral  Oral   ?SpO2: 100% 100% 100% 99%  ?Weight:   51.3 kg   ?Height:      ? ? ?Intake/Output Summary (Last 24 hours) at 10/14/2021 0831 ?Last data filed at 10/13/2021 1007 ?Gross per 24 hour  ?Intake 160 ml  ?Output --  ?Net 160 ml  ? ?Filed Weights  ? 10/12/21 1457 10/13/21 0525 10/14/21 0435  ?Weight: 55.8 kg 51.5 kg 51.3 kg  ? ? ? ?Data Reviewed:  ? ?CBC: ?Recent Labs  ?Lab 10/11/21 ?1848 10/12/21 ?1443 10/12/21 ?1140  ?WBC 5.6 5.5 5.5  ?HGB 8.3* 7.4* 7.6*  ?HCT 26.4* 24.2* 23.3*  ?MCV 98.9 100.0 96.3  ?PLT 86* 79* 68*  ? ?Basic Metabolic Panel: ?Recent Labs  ?Lab 10/11/21 ?2034 10/12/21 ?1540 10/12/21 ?1140  ?NA 139 141 140  ?K 4.7 4.4 4.7  ?CL 100 104 102  ?CO2 24 22 21*  ?GLUCOSE 108* 86 102*  ?BUN 41* 45* 50*  ?CREATININE 4.91* 4.90* 5.28*  ?CALCIUM 8.2* 7.9* 8.0*  ?PHOS  --   --  4.6  ? ?GFR: ?Estimated Creatinine Clearance: 8.3 mL/min (A) (by C-G formula based on SCr of 5.28 mg/dL (H)). ?Liver Function Tests: ?Recent Labs  ?Lab 10/12/21 ?1140  ?ALBUMIN 3.2*  ? ?No results for input(s): LIPASE, AMYLASE in the last 168 hours. ?No results for input(s): AMMONIA in the last 168 hours. ?Coagulation Profile: ?Recent Labs  ?Lab 10/12/21 ?1230 10/13/21 ?0438 10/14/21 ?0426  ?INR 2.1* 2.1* 1.8*  ? ?Cardiac Enzymes: ?Recent Labs  ?Lab 10/11/21 ?2034  ?CKTOTAL 93  ? ?BNP (last 3 results) ?No results for input(s): PROBNP in the last 8760 hours. ?HbA1C: ?Recent Labs  ?  10/12/21 ?1213  ?HGBA1C 6.0*  ? ?CBG: ?Recent Labs  ?Lab 10/13/21 ?0867 10/13/21 ?1237 10/13/21 ?1618 10/13/21 ?2035 10/14/21 ?0731  ?GLUCAP 79 117* 144* 118* 108*  ? ?Lipid Profile: ?No results for input(s): CHOL, HDL, LDLCALC, TRIG, CHOLHDL, LDLDIRECT in the last 72 hours. ?Thyroid Function Tests: ?No results for input(s): TSH, T4TOTAL, FREET4, T3FREE, THYROIDAB in the last 72 hours. ?Anemia Panel: ?No results for input(s): VITAMINB12, FOLATE, FERRITIN, TIBC, IRON, RETICCTPCT in the last 72 hours. ?Sepsis Labs: ?No results for input(s):  PROCALCITON, LATICACIDVEN in the last 168 hours. ? ?Recent Results (from the past 240 hour(s))  ?Resp Panel by RT-PCR (Flu A&B, Covid) Nasopharyngeal Swab     Status: None  ? Collection Time: 10/11/21  8:34 PM  ? Specimen: Nasopharyngeal Swab; Nasopharyngeal(NP) swabs in vial transport medium  ?Result Value Ref Range Status  ? SARS Coronavirus 2 by RT PCR NEGATIVE NEGATIVE Final  ?  Comment: (NOTE) ?SARS-CoV-2 target nucleic acids are NOT DETECTED. ? ?The SARS-CoV-2 RNA is generally detectable in upper respiratory ?specimens during the acute phase of infection. The lowest ?concentration of SARS-CoV-2 viral copies this assay can detect is ?138 copies/mL. A negative result does not preclude SARS-Cov-2 ?infection and should not be used as the sole basis for treatment or ?other patient management decisions. A negative result may occur with  ?improper specimen collection/handling, submission of specimen other ?than nasopharyngeal swab, presence of viral mutation(s) within the ?areas targeted by this assay, and inadequate number of viral ?copies(<138 copies/mL). A negative result  must be combined with ?clinical observations, patient history, and epidemiological ?information. The expected result is Negative. ? ?Fact Sheet for Patients:  ?EntrepreneurPulse.com.au ? ?Fact Sheet for Healthcare Providers:  ?IncredibleEmployment.be ? ?This test is no t yet approved or cleared by the [place] FDA and  ?has been authorized for detection and/or diagnosis of SARS-CoV-2 by ?FDA under an Emergency Use Authorization (EUA). This EUA will remain  ?in effect (meaning this test can be used) for the duration of the ?COVID-19 declaration under Section 564(b)(1) of the Act, 21 ?U.S.C.section 360bbb-3(b)(1), unless the authorization is terminated  ?or revoked sooner.  ? ? ?  ? Influenza A by PCR NEGATIVE NEGATIVE Final  ? Influenza B by PCR NEGATIVE NEGATIVE Final  ?  Comment: (NOTE) ?The Xpert Xpress  SARS-CoV-2/FLU/RSV plus assay is intended as an aid ?in the diagnosis of influenza from Nasopharyngeal swab specimens and ?should not be used as a sole basis for treatment. Nasal washings and ?aspirates are unaccepta

## 2021-10-14 NOTE — Progress Notes (Signed)
INR is 1.8 today.  Schedule permitting, we will proceed with right arm fistulogram tomorrow.  She will be n.p.o. after midnight. ? ?Annamarie Major ?

## 2021-10-15 DIAGNOSIS — G7001 Myasthenia gravis with (acute) exacerbation: Secondary | ICD-10-CM | POA: Diagnosis not present

## 2021-10-15 LAB — MAGNESIUM: Magnesium: 2 mg/dL (ref 1.7–2.4)

## 2021-10-15 LAB — TYPE AND SCREEN
ABO/RH(D): O NEG
Antibody Screen: NEGATIVE
Unit division: 0

## 2021-10-15 LAB — CBC
HCT: 25.1 % — ABNORMAL LOW (ref 36.0–46.0)
Hemoglobin: 8.2 g/dL — ABNORMAL LOW (ref 12.0–15.0)
MCH: 30.7 pg (ref 26.0–34.0)
MCHC: 32.7 g/dL (ref 30.0–36.0)
MCV: 94 fL (ref 80.0–100.0)
Platelets: 67 10*3/uL — ABNORMAL LOW (ref 150–400)
RBC: 2.67 MIL/uL — ABNORMAL LOW (ref 3.87–5.11)
RDW: 18.3 % — ABNORMAL HIGH (ref 11.5–15.5)
WBC: 4.6 10*3/uL (ref 4.0–10.5)
nRBC: 1.1 % — ABNORMAL HIGH (ref 0.0–0.2)

## 2021-10-15 LAB — BASIC METABOLIC PANEL
Anion gap: 13 (ref 5–15)
BUN: 49 mg/dL — ABNORMAL HIGH (ref 8–23)
CO2: 27 mmol/L (ref 22–32)
Calcium: 8.8 mg/dL — ABNORMAL LOW (ref 8.9–10.3)
Chloride: 100 mmol/L (ref 98–111)
Creatinine, Ser: 6.17 mg/dL — ABNORMAL HIGH (ref 0.44–1.00)
GFR, Estimated: 7 mL/min — ABNORMAL LOW (ref >60)
Glucose, Bld: 91 mg/dL (ref 70–99)
Potassium: 3.9 mmol/L (ref 3.5–5.1)
Sodium: 140 mmol/L (ref 135–145)

## 2021-10-15 LAB — PROTIME-INR
INR: 1.6 — ABNORMAL HIGH (ref 0.8–1.2)
INR: 1.5 — ABNORMAL HIGH (ref 0.8–1.2)
Prothrombin Time: 18.6 seconds — ABNORMAL HIGH (ref 11.4–15.2)
Prothrombin Time: 17.8 seconds — ABNORMAL HIGH (ref 11.4–15.2)

## 2021-10-15 LAB — BPAM RBC
Blood Product Expiration Date: 202305032359
ISSUE DATE / TIME: 202304231406
Unit Type and Rh: 5100

## 2021-10-15 LAB — GLUCOSE, CAPILLARY
Glucose-Capillary: 92 mg/dL (ref 70–99)
Glucose-Capillary: 95 mg/dL (ref 70–99)
Glucose-Capillary: 127 mg/dL — ABNORMAL HIGH (ref 70–99)
Glucose-Capillary: 317 mg/dL — ABNORMAL HIGH (ref 70–99)
Glucose-Capillary: 46 mg/dL — ABNORMAL LOW (ref 70–99)
Glucose-Capillary: 70 mg/dL (ref 70–99)

## 2021-10-15 NOTE — Progress Notes (Signed)
Mobility Specialist - Progress Note ? ? 10/15/21 1157  ?Mobility  ?Activity Refused mobility  ? ? ? ?Pt declined mobility, no reason specified. Pt reports wanting to take a day off from activity, will attempt another date.  ? ? ?Kathee Delton ?Mobility Specialist ?10/15/21, 12:00 PM ? ? ? ? ?

## 2021-10-15 NOTE — Progress Notes (Signed)
PT Cancellation Note ? ?Patient Details ?Name: Tammie Klein ?MRN: [id number] ?DOB: 1951/02/27 ? ? ?Cancelled Treatment:    Reason Eval/Treat Not Completed: Fatigue/lethargy limiting ability to participate ? ?Offered and encouraged session.  She reported she was awaiting procedure and did not feel up to therapy today.  Will return tomorrow.  Relayed request to mobility team.   ? ? ?Chesley Noon ?10/15/2021, 11:51 AM ?

## 2021-10-15 NOTE — Progress Notes (Signed)
?PROGRESS NOTE ? ? ? ?Tammie Klein  GEX:[id number] DOB: 1951/05/13 DOA: 10/11/2021 ?PCP: Pcp, No  ? ?Brief Narrative:  ?71 year old with history of myasthenia gravis, ESRD on hemodialysis MWF, type 2 diabetes and hypertension recently moved from [place] to [place] to live with her son and daughter-in-law brought to ER with shortness of breath and bilateral lower extremity weakness.  Patient complained of shortness of breath worsening over the last few months.  She has occasional cough but no wheezing.  Extremity weakness is ongoing for many weeks, recently worse to the extent she is not able to walk very well.  In the emergency room hemodynamically stable.  Chest x-ray with mild cardiomegaly.  Given 1 L fluid bolus.  Nephrology and neurology consulted and admitted to the hospital. 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  She also has history of carotid artery stenosis. ? ? ?Assessment & Plan: ? Principal Problem: ?  Myasthenia gravis with (acute) exacerbation ([place]) ?Active Problems: ?  Major depressive disorder, recurrent episode, moderate ([place]) ?  End-stage renal disease on hemodialysis ([place]) ?  Dyslipidemia ?  Essential hypertension ?  Chronic systolic CHF (congestive heart failure) ([place]) ?  ?  ?  ?Bilateral lower extremity weakness and suspected myasthenia gravis with acute exacerbation ?Status post thymectomy ?-At home patient is on prednisone 5 mg 3 times a week.  At this time recommending management of volume overload with dialysis but if not improved, she may receive IVIG for 5 days. ?- Continue home prednisone ?  ?ESRD on hemodialysis: Right upper extremity AV fistula present ?-Followed by nephrology team.  Initially on Coumadin severe bleeding after cannulation, seen by vascular team who is recommending will need fistulogram to evaluate for stenosis and possible intervention on Monday.  In the meantime anticoagulation is on hold. ?-Nephrology team  following ? ?Acute on chronic anemia ?- Hemoglobin down to 6.2.  Anticoagulation currently on hold, 1 unit PRBC transfusion ordered. ?  ?Type 2 diabetes, well controlled ?-Sliding scale and Accu-Cheks  ? ?History of recurrent DVT ?-3 episodes over the last year.  On lifelong blood thinner.  Previous provider changed Eliquis to Coumadin after discussing this with the patient.  Currently on hold due to blood loss anemia and anticipation for fistulogram. ? ?Congestive heart failure with reduced ejection fraction, EF 35% ?CAD s/p PCI ?- Continue home Lasix 80 mg twice daily, Entresto, BiDil ?Avoiding beta-blocker as it can exacerbate myasthenia gravis ? ?Hyperlipidemia ?- Not on statin due to myasthenia gravis ? ?Osteoarthritis ?- Pain control ? ?Frailty and debility: Continue to work with PT OT.  SNF versus home with home health therapies. ?  ? ? ?DVT prophylaxis: On Hold due to bleeding AV Fistula.  ?Code Status: Full  ?Family Communication:  Family at Bedside ? ?Status is: Inpatient ?Remains inpatient appropriate because: Ongoing vascular evaluation ? ? ? ? ? ? ?Subjective: ?Feeling hungry but no other complaints at this time.  ? ? ?Examination: ? ?Constitutional: Not in acute distress ?Respiratory: Clear to auscultation bilaterally ?Cardiovascular: Normal sinus rhythm, no rubs ?Abdomen: Nontender nondistended good bowel sounds ?Musculoskeletal: No edema noted ?Skin: No rashes seen ?Neurologic: CN 2-12 grossly intact.  And nonfocal ?Psychiatric: Normal judgment and insight. Alert and oriented x 3. Normal mood.  ? ?Right upper extremity AV fistula ? ? ?Objective: ?Vitals:  ? 10/14/21 1730 10/14/21 2008 10/15/21 0533 10/15/21 0752  ?BP: 113/67 120/63 131/60 (!) 142/66  ?Pulse: 85 76 82 68  ?  Resp: 18 18 16 20   ?Temp: 98.1 ?F (36.7 ?C) 98 ?F (36.7 ?C) 98.4 ?F (36.9 ?C) 97.8 ?F (36.6 ?C)  ?TempSrc: Oral  Oral   ?SpO2: 100% 100% 100% 100%  ?Weight:   51.7 kg   ?Height:      ? ? ?Intake/Output Summary (Last 24 hours) at  10/15/2021 1122 ?Last data filed at 10/14/2021 1736 ?Gross per 24 hour  ?Intake 430 ml  ?Output --  ?Net 430 ml  ? ?Filed Weights  ? 10/13/21 0525 10/14/21 0435 10/15/21 0533  ?Weight: 51.5 kg 51.3 kg 51.7 kg  ? ? ? ?Data Reviewed:  ? ?CBC: ?Recent Labs  ?Lab 10/11/21 ?1848 10/12/21 ?2130 10/12/21 ?1140 10/14/21 ?0426 10/15/21 ?0912  ?WBC 5.6 5.5 5.5 4.5 4.6  ?HGB 8.3* 7.4* 7.6* 6.2* 8.2*  ?HCT 26.4* 24.2* 23.3* 19.6* 25.1*  ?MCV 98.9 100.0 96.3 99.0 94.0  ?PLT 86* 79* 68* 70* 67*  ? ?Basic Metabolic Panel: ?Recent Labs  ?Lab 10/11/21 ?2034 10/12/21 ?8657 10/12/21 ?1140 10/14/21 ?0426 10/15/21 ?0912  ?NA 139 141 140 138 140  ?K 4.7 4.4 4.7 3.7 3.9  ?CL 100 104 102 102 100  ?CO2 24 22 21* 29 27  ?GLUCOSE 108* 86 102* 101* 91  ?BUN 41* 45* 50* 40* 49*  ?CREATININE 4.91* 4.90* 5.28* 4.79* 6.17*  ?CALCIUM 8.2* 7.9* 8.0* 7.9* 8.8*  ?MG  --   --   --   --  2.0  ?PHOS  --   --  4.6 4.5  --   ? ?GFR: ?Estimated Creatinine Clearance: 7.1 mL/min (A) (by C-G formula based on SCr of 6.17 mg/dL (H)). ?Liver Function Tests: ?Recent Labs  ?Lab 10/12/21 ?1140 10/14/21 ?0426  ?ALBUMIN 3.2* 2.7*  ? ?No results for input(s): LIPASE, AMYLASE in the last 168 hours. ?No results for input(s): AMMONIA in the last 168 hours. ?Coagulation Profile: ?Recent Labs  ?Lab 10/12/21 ?1230 10/13/21 ?0438 10/14/21 ?0426 10/15/21 ?0501 10/15/21 ?0912  ?INR 2.1* 2.1* 1.8* 1.6* 1.5*  ? ?Cardiac Enzymes: ?Recent Labs  ?Lab 10/11/21 ?2034  ?CKTOTAL 93  ? ?BNP (last 3 results) ?No results for input(s): PROBNP in the last 8760 hours. ?HbA1C: ?Recent Labs  ?  10/12/21 ?1213  ?HGBA1C 6.0*  ? ?CBG: ?Recent Labs  ?Lab 10/14/21 ?0731 10/14/21 ?1134 10/14/21 ?1634 10/14/21 ?2048 10/15/21 ?0800  ?GLUCAP 108* 127* 146* 121* 92  ? ?Lipid Profile: ?No results for input(s): CHOL, HDL, LDLCALC, TRIG, CHOLHDL, LDLDIRECT in the last 72 hours. ?Thyroid Function Tests: ?No results for input(s): TSH, T4TOTAL, FREET4, T3FREE, THYROIDAB in the last 72 hours. ?Anemia Panel: ?No  results for input(s): VITAMINB12, FOLATE, FERRITIN, TIBC, IRON, RETICCTPCT in the last 72 hours. ?Sepsis Labs: ?No results for input(s): PROCALCITON, LATICACIDVEN in the last 168 hours. ? ?Recent Results (from the past 240 hour(s))  ?Resp Panel by RT-PCR (Flu A&B, Covid) Nasopharyngeal Swab     Status: None  ? Collection Time: 10/11/21  8:34 PM  ? Specimen: Nasopharyngeal Swab; Nasopharyngeal(NP) swabs in vial transport medium  ?Result Value Ref Range Status  ? SARS Coronavirus 2 by RT PCR NEGATIVE NEGATIVE Final  ?  Comment: (NOTE) ?SARS-CoV-2 target nucleic acids are NOT DETECTED. ? ?The SARS-CoV-2 RNA is generally detectable in upper respiratory ?specimens during the acute phase of infection. The lowest ?concentration of SARS-CoV-2 viral copies this assay can detect is ?138 copies/mL. A negative result does not preclude SARS-Cov-2 ?infection and should not be used as the sole basis for treatment or ?other patient management decisions.  A negative result may occur with  ?improper specimen collection/handling, submission of specimen other ?than nasopharyngeal swab, presence of viral mutation(s) within the ?areas targeted by this assay, and inadequate number of viral ?copies(<138 copies/mL). A negative result must be combined with ?clinical observations, patient history, and epidemiological ?information. The expected result is Negative. ? ?Fact Sheet for Patients:  ?EntrepreneurPulse.com.au ? ?Fact Sheet for Healthcare Providers:  ?IncredibleEmployment.be ? ?This test is no t yet approved or cleared by the [place] FDA and  ?has been authorized for detection and/or diagnosis of SARS-CoV-2 by ?FDA under an Emergency Use Authorization (EUA). This EUA will remain  ?in effect (meaning this test can be used) for the duration of the ?COVID-19 declaration under Section 564(b)(1) of the Act, 21 ?U.S.C.section 360bbb-3(b)(1), unless the authorization is terminated  ?or revoked sooner.   ? ? ?  ? Influenza A by PCR NEGATIVE NEGATIVE Final  ? Influenza B by PCR NEGATIVE NEGATIVE Final  ?  Comment: (NOTE) ?The Xpert Xpress SARS-CoV-2/FLU/RSV plus assay is intended as an aid ?in the diagnosis of

## 2021-10-15 NOTE — Progress Notes (Signed)
Subjective: ?Fistulogram delayed until tomorrow. Patient fatigued from being npo this AM, now eating peanut butter. Feels weakness is unchanged since yesterday. ? ?Objective: ?Current vital signs: ?BP 118/69 (BP Location: Left Leg)   Pulse 76   Temp 97.9 ?F (36.6 ?C) (Oral)   Resp 18   Ht 5\' 3"  (1.6 m)   Wt 51.7 kg   SpO2 98%   BMI 20.19 kg/m?  ?Vital signs in last 24 hours: ?Temp:  [97.8 ?F (36.6 ?C)-98.4 ?F (36.9 ?C)] 97.9 ?F (36.6 ?C) (04/24 1217) ?Pulse Rate:  [68-85] 76 (04/24 1217) ?Resp:  [16-20] 18 (04/24 1217) ?BP: (113-142)/(60-69) 118/69 (04/24 1217) ?SpO2:  [98 %-100 %] 98 % (04/24 1217) ?Weight:  [51.7 kg] 51.7 kg (04/24 0533) ? ?Intake/Output from previous day: ?04/23 0701 - 04/24 0700 ?In: 550 [P.O.:240; Blood:310] ?Out: -  ?Intake/Output this shift: ?No intake/output data recorded. ?Nutritional status:  ?Diet Order   ? ?       ?  Diet Heart Room service appropriate? Yes; Fluid consistency: Thin  Diet effective now       ?  ? ?  ?  ? ?  ? ?Physical Exam  ?HEENT-  [place] [address]/AT ?Lungs- Respirations unlabored ?Extremities- Edema has resolved ?  ?  ?Neurological Examination ?Mental Status: Alert and oriented, thought content appropriate. Fatigued appearing. Speech fluent without evidence of aphasia.  Able to follow all commands without difficulty. ?Cranial Nerves: ?II: Fixates and tracks normally.   ?III,IV, VI: Mild bilateral ptosis. EOMI with ability to fully bury sclerae on horizontal gaze and no fatiguability of extraocular muscles with upgaze, except for mild transient esotropia when eyes return back to the midline after sustained upgaze.  ?VII: Smile symmetric.  ?VIII: Hearing intact to voice ?IX,X: No hoarseness or hypophonia ?XI: Head is midline ?XII: Midline tongue extension ?Motor: ?BUE: 4/5 BUE proximally and distally without asymmetry ?BLE 4/5 hip flexion, knee flexion and knee extension on the left, 4-/5 for these modalities on the right. ADF/APF 4+/5 bilaterally .  ?Cerebellar: No ataxia  noted  ?Gait: Deferred ? ?Lab Results: ?Results for orders placed or performed during the hospital encounter of 10/11/21 (from the past 48 hour(s))  ?Glucose, capillary     Status: Abnormal  ? Collection Time: 10/13/21  4:18 PM  ?Result Value Ref Range  ? Glucose-Capillary 144 (H) 70 - 99 mg/dL  ?  Comment: Glucose reference range applies only to samples taken after fasting for at least 8 hours.  ?Glucose, capillary     Status: Abnormal  ? Collection Time: 10/13/21  8:35 PM  ?Result Value Ref Range  ? Glucose-Capillary 118 (H) 70 - 99 mg/dL  ?  Comment: Glucose reference range applies only to samples taken after fasting for at least 8 hours.  ?Protime-INR     Status: Abnormal  ? Collection Time: 10/14/21  4:26 AM  ?Result Value Ref Range  ? Prothrombin Time 20.7 (H) 11.4 - 15.2 seconds  ? INR 1.8 (H) 0.8 - 1.2  ?  Comment: (NOTE) ?INR goal varies based on device and disease states. ?Performed at [facility], [place], ?[place] [address] ?  ?Renal function panel     Status: Abnormal  ? Collection Time: 10/14/21  4:26 AM  ?Result Value Ref Range  ? Sodium 138 135 - 145 mmol/L  ? Potassium 3.7 3.5 - 5.1 mmol/L  ? Chloride 102 98 - 111 mmol/L  ? CO2 29 22 - 32 mmol/L  ? Glucose, Bld 101 (H) 70 -  99 mg/dL  ?  Comment: Glucose reference range applies only to samples taken after fasting for at least 8 hours.  ? BUN 40 (H) 8 - 23 mg/dL  ? Creatinine, Ser 4.79 (H) 0.44 - 1.00 mg/dL  ? Calcium 7.9 (L) 8.9 - 10.3 mg/dL  ? Phosphorus 4.5 2.5 - 4.6 mg/dL  ? Albumin 2.7 (L) 3.5 - 5.0 g/dL  ? GFR, Estimated 9 (L) >60 mL/min  ?  Comment: (NOTE) ?Calculated using the CKD-EPI Creatinine Equation (2021) ?  ? Anion gap 7 5 - 15  ?  Comment: Performed at [facility], [address], [place], [place] [address]  ?CBC     Status: Abnormal  ? Collection Time: 10/14/21  4:26 AM  ?Result Value Ref Range  ? WBC 4.5 4.0 - 10.5 K/uL  ? RBC 1.98 (L) 3.87 - 5.11 MIL/uL  ? Hemoglobin 6.2 (L) 12.0 - 15.0 g/dL  ?  HCT 19.6 (L) 36.0 - 46.0 %  ? MCV 99.0 80.0 - 100.0 fL  ? MCH 31.3 26.0 - 34.0 pg  ? MCHC 31.6 30.0 - 36.0 g/dL  ? RDW 17.5 (H) 11.5 - 15.5 %  ? Platelets 70 (L) 150 - 400 K/uL  ?  Comment: Immature Platelet Fraction may be ?clinically indicated, consider ?ordering this additional test ?[id number] ?  ? nRBC 1.6 (H) 0.0 - 0.2 %  ?  Comment: Performed at [facility], [address] [place], [place] [address]  ?Glucose, capillary     Status: Abnormal  ? Collection Time: 10/14/21  7:31 AM  ?Result Value Ref Range  ? Glucose-Capillary 108 (H) 70 - 99 mg/dL  ?  Comment: Glucose reference range applies only to samples taken after fasting for at least 8 hours.  ?Glucose, capillary     Status: Abnormal  ? Collection Time: 10/14/21 11:34 AM  ?Result Value Ref Range  ? Glucose-Capillary 127 (H) 70 - 99 mg/dL  ?  Comment: Glucose reference range applies only to samples taken after fasting for at least 8 hours.  ?Prepare RBC (crossmatch)     Status: None  ? Collection Time: 10/14/21 11:48 AM  ?Result Value Ref Range  ? Order Confirmation    ?  ORDER PROCESSED BY BLOOD BANK ?Performed at [facility], [address] [place], [place] [address] ?  ?Type and screen [place]     Status: None  ? Collection Time: 10/14/21 12:07 PM  ?Result Value Ref Range  ? ABO/RH(D) O NEG   ? Antibody Screen NEG   ? Sample Expiration 10/17/2021,2359   ? Unit Number [id number]   ? Blood Component Type RBC LR PHER1   ? Unit division 00   ? Status of Unit ISSUED,FINAL   ? Transfusion Status OK TO TRANSFUSE   ? Crossmatch Result COMPATIBLE   ?ABO/Rh     Status: None  ? Collection Time: 10/14/21 12:14 PM  ?Result Value Ref Range  ? ABO/RH(D)    ?  O NEG ?Performed at [facility], [address], [place], [place] [address] ?  ?Glucose, capillary     Status: Abnormal  ? Collection Time: 10/14/21  4:34 PM  ?Result Value Ref Range  ? Glucose-Capillary 146 (H) 70 - 99 mg/dL  ?  Comment: Glucose  reference range applies only to samples taken after fasting for at least 8 hours.  ?Glucose, capillary     Status: Abnormal  ? Collection Time: 10/14/21  8:48 PM  ?Result  Value Ref Range  ? Glucose-Capillary 121 (H) 70 - 99 mg/dL  ?  Comment: Glucose reference range applies only to samples taken after fasting for at least 8 hours.  ?Protime-INR     Status: Abnormal  ? Collection Time: 10/15/21  5:01 AM  ?Result Value Ref Range  ? Prothrombin Time 18.6 (H) 11.4 - 15.2 seconds  ? INR 1.6 (H) 0.8 - 1.2  ?  Comment: (NOTE) ?INR goal varies based on device and disease states. ?Performed at [facility], [place], ?[place] [address] ?  ?Glucose, capillary     Status: None  ? Collection Time: 10/15/21  8:00 AM  ?Result Value Ref Range  ? Glucose-Capillary 92 70 - 99 mg/dL  ?  Comment: Glucose reference range applies only to samples taken after fasting for at least 8 hours.  ?Basic metabolic panel     Status: Abnormal  ? Collection Time: 10/15/21  9:12 AM  ?Result Value Ref Range  ? Sodium 140 135 - 145 mmol/L  ? Potassium 3.9 3.5 - 5.1 mmol/L  ? Chloride 100 98 - 111 mmol/L  ? CO2 27 22 - 32 mmol/L  ? Glucose, Bld 91 70 - 99 mg/dL  ?  Comment: Glucose reference range applies only to samples taken after fasting for at least 8 hours.  ? BUN 49 (H) 8 - 23 mg/dL  ? Creatinine, Ser 6.17 (H) 0.44 - 1.00 mg/dL  ? Calcium 8.8 (L) 8.9 - 10.3 mg/dL  ? GFR, Estimated 7 (L) >60 mL/min  ?  Comment: (NOTE) ?Calculated using the CKD-EPI Creatinine Equation (2021) ?  ? Anion gap 13 5 - 15  ?  Comment: Performed at [facility], [address], [place], [place] [address]  ?CBC     Status: Abnormal  ? Collection Time: 10/15/21  9:12 AM  ?Result Value Ref Range  ? WBC 4.6 4.0 - 10.5 K/uL  ? RBC 2.67 (L) 3.87 - 5.11 MIL/uL  ? Hemoglobin 8.2 (L) 12.0 - 15.0 g/dL  ?  Comment: REPEATED TO VERIFY  ? HCT 25.1 (L) 36.0 - 46.0 %  ? MCV 94.0 80.0 - 100.0 fL  ? MCH 30.7 26.0 - 34.0 pg  ? MCHC 32.7 30.0 - 36.0 g/dL  ?  RDW 18.3 (H) 11.5 - 15.5 %  ? Platelets 67 (L) 150 - 400 K/uL  ?  Comment: Immature Platelet Fraction may be ?clinically indicated, consider ?ordering this additional test ?[id number] ?  ? nRBC 1.1 (H) 0.0 - 0.2 %  ?

## 2021-10-15 NOTE — Progress Notes (Signed)
?[facility]  ?ROUNDING NOTE  ? ?Subjective:  ? ?Tammie Klein is a 71 year old female with past medical histories including chronic heart failure, diabetes, hypertension, myasthenia gravis, and end-stage renal disease on hemodialysis.  Patient presents to the emergency department with shortness of breath and lower extremity weakness.  Patient has been admitted for Myasthenia gravis with acute exacerbation ([place]) [G70.01] ?Myasthenia gravis with (acute) exacerbation ([place]) [G70.01] ? ?Patient was recently moved to the area from [place] to live with her brother.  Patient recently started receiving outpatient dialysis treatments at [facility], on a Monday Wednesday Friday schedule.  ? ?Update ?Patient resting in bed ?Reports ambulating in hall yesterday evening with rest breaks.  ?Currently NPO for procedure, irritated and thirsty.  ? ? ?Objective:  ?Vital signs in last 24 hours:  ?Temp:  [97.8 ?F (36.6 ?C)-98.5 ?F (36.9 ?C)] 97.9 ?F (36.6 ?C) (04/24 1217) ?Pulse Rate:  [68-85] 76 (04/24 1217) ?Resp:  [16-20] 18 (04/24 1217) ?BP: (106-142)/(55-69) 118/69 (04/24 1217) ?SpO2:  [98 %-100 %] 98 % (04/24 1217) ?Weight:  [51.7 kg] 51.7 kg (04/24 0533) ? ?Weight change: 0.41 kg ?Filed Weights  ? 10/13/21 0525 10/14/21 0435 10/15/21 0533  ?Weight: 51.5 kg 51.3 kg 51.7 kg  ? ? ?Intake/Output: ?I/O last 3 completed shifts: ?In: 550 [P.O.:240; Blood:310] ?Out: -  ?  ?Intake/Output this shift: ? No intake/output data recorded. ? ?Physical Exam: ?General: NAD  ?Head: Normocephalic, atraumatic. Moist oral mucosal membranes  ?Eyes: Anicteric  ?Lungs:  Clear to auscultation, normal effort, room air  ?Heart: Regular rate and rhythm  ?Abdomen:  Soft, nontender, nondistended  ?Extremities: 1+ peripheral edema.  ?Neurologic: Nonfocal, moving all four extremities  ?Skin: No lesions  ?Access: Left aVF  ? ? ?Basic Metabolic Panel: ?Recent Labs  ?Lab 10/11/21 ?2034 10/12/21 ?8756 10/12/21 ?1140 10/14/21 ?0426  10/15/21 ?0912  ?NA 139 141 140 138 140  ?K 4.7 4.4 4.7 3.7 3.9  ?CL 100 104 102 102 100  ?CO2 24 22 21* 29 27  ?GLUCOSE 108* 86 102* 101* 91  ?BUN 41* 45* 50* 40* 49*  ?CREATININE 4.91* 4.90* 5.28* 4.79* 6.17*  ?CALCIUM 8.2* 7.9* 8.0* 7.9* 8.8*  ?MG  --   --   --   --  2.0  ?PHOS  --   --  4.6 4.5  --   ? ? ? ?Liver Function Tests: ?Recent Labs  ?Lab 10/12/21 ?1140 10/14/21 ?0426  ?ALBUMIN 3.2* 2.7*  ? ? ?No results for input(s): LIPASE, AMYLASE in the last 168 hours. ?No results for input(s): AMMONIA in the last 168 hours. ? ?CBC: ?Recent Labs  ?Lab 10/11/21 ?1848 10/12/21 ?4332 10/12/21 ?1140 10/14/21 ?0426 10/15/21 ?0912  ?WBC 5.6 5.5 5.5 4.5 4.6  ?HGB 8.3* 7.4* 7.6* 6.2* 8.2*  ?HCT 26.4* 24.2* 23.3* 19.6* 25.1*  ?MCV 98.9 100.0 96.3 99.0 94.0  ?PLT 86* 79* 68* 70* 67*  ? ? ? ?Cardiac Enzymes: ?Recent Labs  ?Lab 10/11/21 ?2034  ?CKTOTAL 93  ? ? ? ?BNP: ?Invalid input(s): POCBNP ? ?CBG: ?Recent Labs  ?Lab 10/14/21 ?1134 10/14/21 ?1634 10/14/21 ?2048 10/15/21 ?0800 10/15/21 ?1140  ?GLUCAP 127* 146* 121* 92 95  ? ? ? ?Microbiology: ?Results for orders placed or performed during the hospital encounter of 10/11/21  ?Resp Panel by RT-PCR (Flu A&B, Covid) Nasopharyngeal Swab     Status: None  ? Collection Time: 10/11/21  8:34 PM  ? Specimen: Nasopharyngeal Swab; Nasopharyngeal(NP) swabs in vial transport medium  ?Result Value Ref Range Status  ?  SARS Coronavirus 2 by RT PCR NEGATIVE NEGATIVE Final  ?  Comment: (NOTE) ?SARS-CoV-2 target nucleic acids are NOT DETECTED. ? ?The SARS-CoV-2 RNA is generally detectable in upper respiratory ?specimens during the acute phase of infection. The lowest ?concentration of SARS-CoV-2 viral copies this assay can detect is ?138 copies/mL. A negative result does not preclude SARS-Cov-2 ?infection and should not be used as the sole basis for treatment or ?other patient management decisions. A negative result may occur with  ?improper specimen collection/handling, submission of specimen  other ?than nasopharyngeal swab, presence of viral mutation(s) within the ?areas targeted by this assay, and inadequate number of viral ?copies(<138 copies/mL). A negative result must be combined with ?clinical observations, patient history, and epidemiological ?information. The expected result is Negative. ? ?Fact Sheet for Patients:  ?EntrepreneurPulse.com.au ? ?Fact Sheet for Healthcare Providers:  ?IncredibleEmployment.be ? ?This test is no t yet approved or cleared by the [place] FDA and  ?has been authorized for detection and/or diagnosis of SARS-CoV-2 by ?FDA under an Emergency Use Authorization (EUA). This EUA will remain  ?in effect (meaning this test can be used) for the duration of the ?COVID-19 declaration under Section 564(b)(1) of the Act, 21 ?U.S.C.section 360bbb-3(b)(1), unless the authorization is terminated  ?or revoked sooner.  ? ? ?  ? Influenza A by PCR NEGATIVE NEGATIVE Final  ? Influenza B by PCR NEGATIVE NEGATIVE Final  ?  Comment: (NOTE) ?The Xpert Xpress SARS-CoV-2/FLU/RSV plus assay is intended as an aid ?in the diagnosis of influenza from Nasopharyngeal swab specimens and ?should not be used as a sole basis for treatment. Nasal washings and ?aspirates are unacceptable for Xpert Xpress SARS-CoV-2/FLU/RSV ?testing. ? ?Fact Sheet for Patients: ?EntrepreneurPulse.com.au ? ?Fact Sheet for Healthcare Providers: ?IncredibleEmployment.be ? ?This test is not yet approved or cleared by the [place] FDA and ?has been authorized for detection and/or diagnosis of SARS-CoV-2 by ?FDA under an Emergency Use Authorization (EUA). This EUA will remain ?in effect (meaning this test can be used) for the duration of the ?COVID-19 declaration under Section 564(b)(1) of the Act, 21 U.S.C. ?section 360bbb-3(b)(1), unless the authorization is terminated or ?revoked. ? ?Performed at [facility], [place], ?[place] [address] ?  ? ? ?Coagulation Studies: ?Recent Labs  ?  10/13/21 ?0438 10/14/21 ?0426 10/15/21 ?0501 10/15/21 ?0912  ?LABPROT 23.8* 20.7* 18.6* 17.8*  ?INR 2.1* 1.8* 1.6* 1.5*  ? ? ? ?Urinalysis: ?No results for input(s): COLORURINE, LABSPEC, [place], GLUCOSEU, HGBUR, BILIRUBINUR, KETONESUR, PROTEINUR, UROBILINOGEN, NITRITE, LEUKOCYTESUR in the last 72 hours. ? ?Invalid input(s): APPERANCEUR  ? ? ?Imaging: ?No results found. ? ? ?Medications:  ? ? ? ? sodium chloride   Intravenous Once  ? Chlorhexidine Gluconate Cloth  6 each Topical Q0600  ? escitalopram  5 mg Oral Daily  ? famotidine  20 mg Oral Daily  ? feeding supplement  237 mL Oral TID BM  ? furosemide  80 mg Oral BID  ? insulin aspart  0-5 Units Subcutaneous QHS  ? insulin aspart  0-9 Units Subcutaneous TID WC  ? insulin glargine-yfgn  4 Units Subcutaneous Q breakfast  ? isosorbide-hydrALAZINE  1 tablet Oral BID  ? latanoprost  1 drop Both Eyes QHS  ? lidocaine  1 patch Transdermal Q24H  ? megestrol  625 mg Oral Daily  ? melatonin  2.5 mg Oral QHS  ? mirtazapine  7.5 mg Oral QHS  ? multivitamin with minerals  1 tablet Oral Daily  ? pentoxifylline  400  mg Oral Daily  ? predniSONE  5 mg Oral Q breakfast  ? sacubitril-valsartan  1 tablet Oral BID  ? ?acetaminophen **OR** acetaminophen, hydrOXYzine, loperamide, nitroGLYCERIN, ondansetron **OR** ondansetron (ZOFRAN) IV ? ?Assessment/ Plan:  ?[name] is a 71 y.o.  female with past medical histories including chronic heart failure, diabetes, hypertension, myasthenia gravis, and end-stage renal disease on hemodialysis.  Patient presents to the emergency department with shortness of breath and lower extremity weakness.  Patient has been admitted for Myasthenia gravis with acute exacerbation ([place]) [G70.01] ?Myasthenia gravis with (acute) exacerbation ([place]) [G70.01] ? ?[facility]/MWF/left aVF ? ?End-stage renal disease on hemodialysis.  Will maintain outpatient schedule, if possible.   Was planning to hold dialysis until vascular able to perform fistulogram. Vascular unable to perform today, plan to complete tomorrow. Will provide dialysis tomorrow after procedure. Labs stable at this time.  ? ?2. Anemi

## 2021-10-15 NOTE — Progress Notes (Signed)
Pt did not want to take meds while NPO, she requested to hold meds until after procedure and has eated, pt educated, MD notified. ?

## 2021-10-15 NOTE — Progress Notes (Addendum)
Tech reported patient's blood glucose at 46. Started the hypoglycemic protocol and gave patient some gingerale. Blood glucose came up to 70.  ?

## 2021-10-15 NOTE — Plan of Care (Signed)
In to see patient. She is upset she was made NPO and her fistulagram was not completed, nor dialysis. She states her glucose is all over the place and she does not feel good right now. Advised I would follow back up tomorrow.  ?

## 2021-10-15 NOTE — Progress Notes (Signed)
?   10/15/21 1200  ?Clinical Encounter Type  ?Visited With Patient and family together  ?Visit Type Follow-up ?(Advance Directive Completed)  ? ?Chaplain facilitated completion of Advance Directive ?

## 2021-10-16 ENCOUNTER — Inpatient Hospital Stay
Admission: EM | Disposition: A | Discharge status: Skilled Nursing Facility | Payer: Self-pay | Source: Home / Self Care | Attending: Internal Medicine

## 2021-10-16 ENCOUNTER — Encounter: Payer: Self-pay | Admitting: Vascular Surgery

## 2021-10-16 DIAGNOSIS — G7001 Myasthenia gravis with (acute) exacerbation: Secondary | ICD-10-CM | POA: Diagnosis not present

## 2021-10-16 DIAGNOSIS — T82858A Stenosis of vascular prosthetic devices, implants and grafts, initial encounter: Secondary | ICD-10-CM

## 2021-10-16 HISTORY — PX: A/V FISTULAGRAM: CATH118298

## 2021-10-16 LAB — CBC
HCT: 23.6 % — ABNORMAL LOW (ref 36.0–46.0)
Hemoglobin: 7.8 g/dL — ABNORMAL LOW (ref 12.0–15.0)
MCH: 31.2 pg (ref 26.0–34.0)
MCHC: 33.1 g/dL (ref 30.0–36.0)
MCV: 94.4 fL (ref 80.0–100.0)
Platelets: 58 10*3/uL — ABNORMAL LOW (ref 150–400)
RBC: 2.5 MIL/uL — ABNORMAL LOW (ref 3.87–5.11)
RDW: 18.1 % — ABNORMAL HIGH (ref 11.5–15.5)
WBC: 5.6 10*3/uL (ref 4.0–10.5)
nRBC: 0.5 % — ABNORMAL HIGH (ref 0.0–0.2)

## 2021-10-16 LAB — BASIC METABOLIC PANEL WITH GFR
Anion gap: 13 (ref 5–15)
BUN: 58 mg/dL — ABNORMAL HIGH (ref 8–23)
CO2: 24 mmol/L (ref 22–32)
Calcium: 8.4 mg/dL — ABNORMAL LOW (ref 8.9–10.3)
Chloride: 100 mmol/L (ref 98–111)
Creatinine, Ser: 7.16 mg/dL — ABNORMAL HIGH (ref 0.44–1.00)
GFR, Estimated: 6 mL/min — ABNORMAL LOW
Glucose, Bld: 67 mg/dL — ABNORMAL LOW (ref 70–99)
Potassium: 3.7 mmol/L (ref 3.5–5.1)
Sodium: 137 mmol/L (ref 135–145)

## 2021-10-16 LAB — GLUCOSE, CAPILLARY
Glucose-Capillary: 109 mg/dL — ABNORMAL HIGH (ref 70–99)
Glucose-Capillary: 115 mg/dL — ABNORMAL HIGH (ref 70–99)
Glucose-Capillary: 118 mg/dL — ABNORMAL HIGH (ref 70–99)
Glucose-Capillary: 130 mg/dL — ABNORMAL HIGH (ref 70–99)
Glucose-Capillary: 152 mg/dL — ABNORMAL HIGH (ref 70–99)
Glucose-Capillary: 69 mg/dL — ABNORMAL LOW (ref 70–99)
Glucose-Capillary: 75 mg/dL (ref 70–99)
Glucose-Capillary: 81 mg/dL (ref 70–99)
Glucose-Capillary: 83 mg/dL (ref 70–99)
Glucose-Capillary: 84 mg/dL (ref 70–99)
Glucose-Capillary: 96 mg/dL (ref 70–99)

## 2021-10-16 LAB — MAGNESIUM: Magnesium: 2.1 mg/dL (ref 1.7–2.4)

## 2021-10-16 SURGERY — A/V FISTULAGRAM
Anesthesia: Moderate Sedation | Laterality: Right

## 2021-10-16 MED ORDER — FAMOTIDINE 20 MG PO TABS
40.0000 mg | ORAL_TABLET | Freq: Once | ORAL | Status: DC | PRN
Start: 2021-10-16 — End: 2021-10-16

## 2021-10-16 MED ORDER — SODIUM CHLORIDE 0.9 % IV SOLN: INTRAVENOUS | Status: DC

## 2021-10-16 MED ORDER — CEFAZOLIN SODIUM-DEXTROSE 1-4 GM/50ML-% IV SOLN: 1.0000 g | INTRAVENOUS | Status: AC

## 2021-10-16 MED ORDER — FENTANYL CITRATE PF 50 MCG/ML IJ SOSY
PREFILLED_SYRINGE | INTRAMUSCULAR | Status: AC
  Filled 2021-10-16: qty 1

## 2021-10-16 MED ORDER — MIDAZOLAM HCL 2 MG/2ML IJ SOLN
INTRAMUSCULAR | Status: AC
  Filled 2021-10-16: qty 2

## 2021-10-16 MED ORDER — METHYLPREDNISOLONE SODIUM SUCC 125 MG IJ SOLR
125.0000 mg | Freq: Once | INTRAMUSCULAR | Status: DC | PRN
Start: 2021-10-16 — End: 2021-10-16

## 2021-10-16 MED ORDER — FENTANYL CITRATE (PF) 100 MCG/2ML IJ SOLN
INTRAMUSCULAR | Status: DC | PRN
  Administered 2021-10-16: 50 ug via INTRAVENOUS

## 2021-10-16 MED ORDER — ONDANSETRON HCL 4 MG/2ML IJ SOLN
4.0000 mg | Freq: Four times a day (QID) | INTRAMUSCULAR | Status: DC | PRN
Start: 2021-10-16 — End: 2021-10-19

## 2021-10-16 MED ORDER — DEXTROSE 50 % IV SOLN
25.0000 mL | Freq: Once | INTRAVENOUS | Status: AC
  Administered 2021-10-16: 25 mL via INTRAVENOUS

## 2021-10-16 MED ORDER — MIDAZOLAM HCL 2 MG/2ML IJ SOLN
INTRAMUSCULAR | Status: DC | PRN
  Administered 2021-10-16: 2 mg via INTRAVENOUS

## 2021-10-16 MED ORDER — HYDROMORPHONE HCL 1 MG/ML IJ SOLN: 1.0000 mg | Freq: Once | INTRAMUSCULAR | Status: DC | PRN

## 2021-10-16 MED ORDER — HEPARIN SODIUM (PORCINE) 1000 UNIT/ML IJ SOLN
INTRAMUSCULAR | Status: AC
  Filled 2021-10-16: qty 10

## 2021-10-16 MED ORDER — DIPHENHYDRAMINE HCL 50 MG/ML IJ SOLN: 50.0000 mg | Freq: Once | INTRAMUSCULAR | Status: DC | PRN

## 2021-10-16 MED ORDER — HEPARIN SODIUM (PORCINE) 1000 UNIT/ML IJ SOLN
INTRAMUSCULAR | Status: DC | PRN
  Administered 2021-10-16: 4000 [IU] via INTRAVENOUS

## 2021-10-16 MED ORDER — DEXTROSE 50 % IV SOLN
INTRAVENOUS | Status: AC
  Filled 2021-10-16: qty 50

## 2021-10-16 MED ORDER — DEXTROSE 50 % IV SOLN
12.5000 g | INTRAVENOUS | Status: AC
  Administered 2021-10-16: 12.5 g via INTRAVENOUS
  Filled 2021-10-16: qty 50

## 2021-10-16 MED ORDER — CEFAZOLIN SODIUM-DEXTROSE 1-4 GM/50ML-% IV SOLN
INTRAVENOUS | Status: AC
  Administered 2021-10-16: 1 g via INTRAVENOUS
  Filled 2021-10-16: qty 50

## 2021-10-16 MED ORDER — MIDAZOLAM HCL 2 MG/ML PO SYRP
8.0000 mg | ORAL_SOLUTION | Freq: Once | ORAL | Status: DC | PRN
Start: 2021-10-16 — End: 2021-10-16

## 2021-10-16 SURGICAL SUPPLY — 13 items
BALLN DORADO 8X100X80 (BALLOONS) ×2
BALLOON DORADO 8X100X80 (BALLOONS) IMPLANT
CANNULA 5F STIFF (CANNULA) ×1 IMPLANT
COVER PROBE U/S 5X48 (MISCELLANEOUS) ×1 IMPLANT
DRAPE BRACHIAL (DRAPES) ×1 IMPLANT
KIT ENCORE 26 ADVANTAGE (KITS) ×1 IMPLANT
PACK ANGIOGRAPHY (CUSTOM PROCEDURE TRAY) ×2 IMPLANT
SHEATH BRITE TIP 6FRX5.5 (SHEATH) ×1 IMPLANT
SHEATH BRITE TIP 7FRX5.5 (SHEATH) ×1 IMPLANT
STENT VIABAHN 8X100X120 (Permanent Stent) ×2 IMPLANT
STENT VIABAHN 8X10X120 (Permanent Stent) IMPLANT
SUT MNCRL AB 4-0 PS2 18 (SUTURE) ×1 IMPLANT
WIRE G 018X200 V18 (WIRE) ×1 IMPLANT

## 2021-10-16 NOTE — Plan of Care (Signed)
Patient has been in dialysis this afternoon. Unable to discuss [place]. Will reattempt tomorrow.  ?

## 2021-10-16 NOTE — Progress Notes (Signed)
Mobility Specialist - Progress Note ? ? 10/16/21 1400  ?Mobility  ?Activity Off unit  ? ? ? ?Pt off unit for HD tx. Will attempt session another date/time.  ? ? ?Kathee Delton ?Mobility Specialist ?10/16/21, 2:13 PM ? ? ? ? ?

## 2021-10-16 NOTE — Care Management Important Message (Signed)
Important Message ? ?Patient Details  ?Name: Tammie Klein ?MRN: [id number] ?Date of Birth: 05-Aug-1950 ? ? ?Medicare Important Message Given:  Yes ? ?Patient out of room upon time of visit, no family available.  Copy of Medicare IM left in patient's room for reference. ? ? ?Dannette Barbara ?10/16/2021, 11:05 AM ?

## 2021-10-16 NOTE — Progress Notes (Addendum)
PT Cancellation Note ? ?Patient Details ?Name: Tammie Klein ?MRN: [id number] ?DOB: 10-May-1951 ? ? ?Cancelled Treatment:    Reason Eval/Treat Not Completed: Patient at procedure or test/unavailable ?Pt out of room this AM for fistulagram, this PM with HD.  Will maintain on caseload and attempt to see as available and appropriate. ? ?Kreg Shropshire, DPT ?10/16/2021, 11:30 AM ?

## 2021-10-16 NOTE — Progress Notes (Signed)
?[facility]  ?ROUNDING NOTE  ? ?Subjective:  ? ?Tammie Klein is a 71 year old female with past medical histories including chronic heart failure, diabetes, hypertension, myasthenia gravis, and end-stage renal disease on hemodialysis.  Patient presents to the emergency department with shortness of breath and lower extremity weakness.  Patient has been admitted for Myasthenia gravis with acute exacerbation ([place]) [G70.01] ?Myasthenia gravis with (acute) exacerbation ([place]) [G70.01] ? ?Patient was recently moved to the area from [place] to live with her brother.  Patient recently started receiving outpatient dialysis treatments at [facility], on a Monday Wednesday Friday schedule.  ? ?Update ?Patient resting comfortably ?Currently NPO for procedure ?Voices her concern of the unknown and what to expect from this procedure. States she is scared what they may find and if it can be fixed. States they have attempted to repair her access multiple times and still experiences prolonged bleeding times.  ? ? ?Objective:  ?Vital signs in last 24 hours:  ?Temp:  [97.6 ?F (36.4 ?C)-98.8 ?F (37.1 ?C)] 98.6 ?F (37 ?C) (04/25 1032) ?Pulse Rate:  [75-82] 82 (04/25 1032) ?Resp:  [16-20] 20 (04/25 1032) ?BP: (108-153)/(46-69) 130/67 (04/25 1032) ?SpO2:  [98 %-100 %] 100 % (04/25 1032) ? ?Weight change:  ?Filed Weights  ? 10/13/21 0525 10/14/21 0435 10/15/21 0533  ?Weight: 51.5 kg 51.3 kg 51.7 kg  ? ? ?Intake/Output: ?I/O last 3 completed shifts: ?In: 360 [P.O.:360] ?Out: 300 [Urine:300] ?  ?Intake/Output this shift: ? No intake/output data recorded. ? ?Physical Exam: ?General: NAD  ?Head: Normocephalic, atraumatic. Moist oral mucosal membranes  ?Eyes: Anicteric  ?Lungs:  Clear to auscultation, normal effort, room air  ?Heart: Regular rate and rhythm  ?Abdomen:  Soft, nontender, nondistended  ?Extremities: 1+ peripheral edema.  ?Neurologic: Nonfocal, moving all four extremities  ?Skin: No lesions  ?Access: Left aVF   ? ? ?Basic Metabolic Panel: ?Recent Labs  ?Lab 10/12/21 ?6433 10/12/21 ?1140 10/14/21 ?0426 10/15/21 ?2951 10/16/21 ?8841  ?NA 141 140 138 140 137  ?K 4.4 4.7 3.7 3.9 3.7  ?CL 104 102 102 100 100  ?CO2 22 21* 29 27 24   ?GLUCOSE 86 102* 101* 91 67*  ?BUN 45* 50* 40* 49* 58*  ?CREATININE 4.90* 5.28* 4.79* 6.17* 7.16*  ?CALCIUM 7.9* 8.0* 7.9* 8.8* 8.4*  ?MG  --   --   --  2.0 2.1  ?PHOS  --  4.6 4.5  --   --   ? ? ? ?Liver Function Tests: ?Recent Labs  ?Lab 10/12/21 ?1140 10/14/21 ?0426  ?ALBUMIN 3.2* 2.7*  ? ? ?No results for input(s): LIPASE, AMYLASE in the last 168 hours. ?No results for input(s): AMMONIA in the last 168 hours. ? ?CBC: ?Recent Labs  ?Lab 10/12/21 ?6606 10/12/21 ?1140 10/14/21 ?0426 10/15/21 ?3016 10/16/21 ?0109  ?WBC 5.5 5.5 4.5 4.6 5.6  ?HGB 7.4* 7.6* 6.2* 8.2* 7.8*  ?HCT 24.2* 23.3* 19.6* 25.1* 23.6*  ?MCV 100.0 96.3 99.0 94.0 94.4  ?PLT 79* 68* 70* 67* 58*  ? ? ? ?Cardiac Enzymes: ?Recent Labs  ?Lab 10/11/21 ?2034  ?CKTOTAL 93  ? ? ? ?BNP: ?Invalid input(s): POCBNP ? ?CBG: ?Recent Labs  ?Lab 10/16/21 ?0101 10/16/21 ?3235 10/16/21 ?5732 10/16/21 ?2025 10/16/21 ?1037  ?GLUCAP 84 69* 152* 96 81  ? ? ? ?Microbiology: ?Results for orders placed or performed during the hospital encounter of 10/11/21  ?Resp Panel by RT-PCR (Flu A&B, Covid) Nasopharyngeal Swab     Status: None  ? Collection Time: 10/11/21  8:34 PM  ?  Specimen: Nasopharyngeal Swab; Nasopharyngeal(NP) swabs in vial transport medium  ?Result Value Ref Range Status  ? SARS Coronavirus 2 by RT PCR NEGATIVE NEGATIVE Final  ?  Comment: (NOTE) ?SARS-CoV-2 target nucleic acids are NOT DETECTED. ? ?The SARS-CoV-2 RNA is generally detectable in upper respiratory ?specimens during the acute phase of infection. The lowest ?concentration of SARS-CoV-2 viral copies this assay can detect is ?138 copies/mL. A negative result does not preclude SARS-Cov-2 ?infection and should not be used as the sole basis for treatment or ?other patient management  decisions. A negative result may occur with  ?improper specimen collection/handling, submission of specimen other ?than nasopharyngeal swab, presence of viral mutation(s) within the ?areas targeted by this assay, and inadequate number of viral ?copies(<138 copies/mL). A negative result must be combined with ?clinical observations, patient history, and epidemiological ?information. The expected result is Negative. ? ?Fact Sheet for Patients:  ?EntrepreneurPulse.com.au ? ?Fact Sheet for Healthcare Providers:  ?IncredibleEmployment.be ? ?This test is no t yet approved or cleared by the [place] FDA and  ?has been authorized for detection and/or diagnosis of SARS-CoV-2 by ?FDA under an Emergency Use Authorization (EUA). This EUA will remain  ?in effect (meaning this test can be used) for the duration of the ?COVID-19 declaration under Section 564(b)(1) of the Act, 21 ?U.S.C.section 360bbb-3(b)(1), unless the authorization is terminated  ?or revoked sooner.  ? ? ?  ? Influenza A by PCR NEGATIVE NEGATIVE Final  ? Influenza B by PCR NEGATIVE NEGATIVE Final  ?  Comment: (NOTE) ?The Xpert Xpress SARS-CoV-2/FLU/RSV plus assay is intended as an aid ?in the diagnosis of influenza from Nasopharyngeal swab specimens and ?should not be used as a sole basis for treatment. Nasal washings and ?aspirates are unacceptable for Xpert Xpress SARS-CoV-2/FLU/RSV ?testing. ? ?Fact Sheet for Patients: ?EntrepreneurPulse.com.au ? ?Fact Sheet for Healthcare Providers: ?IncredibleEmployment.be ? ?This test is not yet approved or cleared by the [place] FDA and ?has been authorized for detection and/or diagnosis of SARS-CoV-2 by ?FDA under an Emergency Use Authorization (EUA). This EUA will remain ?in effect (meaning this test can be used) for the duration of the ?COVID-19 declaration under Section 564(b)(1) of the Act, 21 U.S.C. ?section 360bbb-3(b)(1), unless the  authorization is terminated or ?revoked. ? ?Performed at [facility], [place], ?[place] [address] ?  ? ? ?Coagulation Studies: ?Recent Labs  ?  10/14/21 ?0426 10/15/21 ?0501 10/15/21 ?0912  ?LABPROT 20.7* 18.6* 17.8*  ?INR 1.8* 1.6* 1.5*  ? ? ? ?Urinalysis: ?No results for input(s): COLORURINE, LABSPEC, [place], GLUCOSEU, HGBUR, BILIRUBINUR, KETONESUR, PROTEINUR, UROBILINOGEN, NITRITE, LEUKOCYTESUR in the last 72 hours. ? ?Invalid input(s): APPERANCEUR  ? ? ?Imaging: ?No results found. ? ? ?Medications:  ? ? sodium chloride    ? ? ? [MAR Hold] sodium chloride   Intravenous Once  ? [MAR Hold] Chlorhexidine Gluconate Cloth  6 each Topical Q0600  ? dextrose      ? [MAR Hold] escitalopram  5 mg Oral Daily  ? [MAR Hold] famotidine  20 mg Oral Daily  ? [MAR Hold] feeding supplement  237 mL Oral TID BM  ? fentaNYL      ? [MAR Hold] furosemide  80 mg Oral BID  ? heparin sodium (porcine)      ? [MAR Hold] insulin aspart  0-5 Units Subcutaneous QHS  ? [MAR Hold] insulin aspart  0-9 Units Subcutaneous TID WC  ? [MAR Hold] isosorbide-hydrALAZINE  1 tablet Oral BID  ? [MAR Hold] latanoprost  1 drop Both Eyes QHS  ? [MAR Hold] lidocaine  1 patch Transdermal Q24H  ? [MAR Hold] megestrol  625 mg Oral Daily  ? [MAR Hold] melatonin  2.5 mg Oral QHS  ? midazolam      ? [MAR Hold] mirtazapine  7.5 mg Oral QHS  ? [MAR Hold] multivitamin with minerals  1 tablet Oral Daily  ? [MAR Hold] pentoxifylline  400 mg Oral Daily  ? [MAR Hold] predniSONE  5 mg Oral Q breakfast  ? [MAR Hold] sacubitril-valsartan  1 tablet Oral BID  ? ?[MAR Hold] acetaminophen **OR** [MAR Hold] acetaminophen, diphenhydrAMINE, famotidine, fentaNYL, heparin sodium (porcine), HYDROmorphone (DILAUDID) injection, [MAR Hold] hydrOXYzine, [MAR Hold] loperamide, methylPREDNISolone (SOLU-MEDROL) injection, midazolam, midazolam, [MAR Hold] nitroGLYCERIN, [MAR Hold] ondansetron **OR** [MAR Hold] ondansetron (ZOFRAN) IV, ondansetron (ZOFRAN)  IV ? ?Assessment/ Plan:  ?[name] is a 71 y.o.  female with past medical histories including chronic heart failure, diabetes, hypertension, myasthenia gravis, and end-stage renal disease on hemodialysis.  Pati

## 2021-10-16 NOTE — Plan of Care (Signed)
Patient unavailable to be seen this AM 2/2 being in procedure with vascular surgery. Will evaluate her tomorrow AM, with tentative plan to start IVIG after that. See progress note from neuro from yesterday for full plan. ? ?Su Monks, MD ?[facility] ?[phone] ? ?If 7pm- 7am, please page neurology on call as listed in [place]. ? ?

## 2021-10-16 NOTE — Interval H&P Note (Signed)
History and Physical Interval Note: ? ?10/16/2021 ?10:43 AM ? ?Tammie Klein  has presented today for surgery, with the diagnosis of ESRD.  The various methods of treatment have been discussed with the patient and family. After consideration of risks, benefits and other options for treatment, the patient has consented to  Procedure(s): ?A/V Fistulagram (Right) as a surgical intervention.  The patient's history has been reviewed, patient examined, no change in status, stable for surgery.  I have reviewed the patient's chart and labs.  Questions were answered to the patient's satisfaction.   ? ? ?[name] ? ? ?

## 2021-10-16 NOTE — Progress Notes (Signed)
Hemodialysis Post Treatment Note: ? ?Tx date:10/16/2021 ?Tx time:3 hours ?Access:right AVF ?UF Removed: 525ml ? ?Note: HD completed. Tolerated well. No complications. Patient asymptomatic ? ? ? ? ? ? ? ? ?  ?

## 2021-10-16 NOTE — OR Nursing (Signed)
Pt fsbs 81, Pt reports feeling symptomatic, discussed with dr schnier. 1/2 amp dextrose given iv.  ?

## 2021-10-16 NOTE — Progress Notes (Signed)
OT Cancellation Note ? ?Patient Details ?Name: Tammie Klein ?MRN: [id number] ?DOB: Jun 06, 1951 ? ? ?Cancelled Treatment:    Reason Eval/Treat Not Completed: Patient at procedure or test/ unavailable. Pt noted to be off the floor for HD, unavailable at this time. Will continue to follow POC at later date/time as pt available.  ? ?Dessie Coma, M.S. OTR/L  ?10/16/21, 2:01 PM  ?ascom [phone] ? ?

## 2021-10-16 NOTE — Progress Notes (Signed)
?PROGRESS NOTE ? ? ? ?Tammie Klein  JQB:[id number] DOB: March 15, 1951 DOA: 10/11/2021 ?PCP: Pcp, No  ? ?Brief Narrative:  ?71 year old with history of myasthenia gravis, ESRD on hemodialysis MWF, type 2 diabetes and hypertension recently moved from [place] to [place] to live with her son and daughter-in-law brought to ER with shortness of breath and bilateral lower extremity weakness.  Patient complained of shortness of breath worsening over the last few months.  She has occasional cough but no wheezing.  Extremity weakness is ongoing for many weeks, recently worse to the extent she is not able to walk very well.  In the emergency room hemodynamically stable.  Chest x-ray with mild cardiomegaly.  Given 1 L fluid bolus.  Nephrology and neurology consulted and admitted to the hospital. 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  She also has history of carotid artery stenosis. Vascular planning on Fistulogram today, Neuro planning IVIG thereafter.  ? ? ?Assessment & Plan: ? Principal Problem: ?  Myasthenia gravis with (acute) exacerbation ([place]) ?Active Problems: ?  Major depressive disorder, recurrent episode, moderate ([place]) ?  End-stage renal disease on hemodialysis ([place]) ?  Dyslipidemia ?  Essential hypertension ?  Chronic systolic CHF (congestive heart failure) ([place]) ?  Pain of right upper extremity ?  ?  ?  ?Bilateral lower extremity weakness and suspected myasthenia gravis with acute exacerbation ?Status post thymectomy ?-At home patient is on prednisone 5 mg 3 times a week.  Neuro allergy is recommending IVIG ?- Continue home prednisone ?  ?ESRD on hemodialysis: Right upper extremity AV fistula present ?-Followed by nephrology team.  Initially on Coumadin severe bleeding after cannulation, seen by vascular team who is recommending fistulogram.  Postponed from yesterday due to scheduling conflict.  Anticoagulation is on hold ?-Nephrology team following ? ?Acute on  chronic anemia ?- Hemoglobin down to 6.2.  Status post 1 unit PRBC transfusion 4/23.  Hemoglobin stable for now. ?  ?Type 2 diabetes, episode of hypoglycemia. ?-Sliding scale and Accu-Cheks.  Due to low blood glucose long-acting discontinued ? ?History of recurrent DVT ?-3 episodes over the last year.  On lifelong blood thinner.  Previous provider changed Eliquis to Coumadin after discussing this with the patient.  Currently on hold due to blood loss anemia and anticipation for fistulogram. ? ?Congestive heart failure with reduced ejection fraction, EF 35% ?CAD s/p PCI ?- Continue home Lasix 80 mg twice daily, Entresto, BiDil ?Avoiding beta-blocker as it can exacerbate myasthenia gravis ? ?Hyperlipidemia ?- Not on statin due to myasthenia gravis ? ?Osteoarthritis ?- Pain control ? ?Frailty and debility: Continue to work with PT OT.  SNF versus home with home health therapies. ?  ? ? ?DVT prophylaxis: On Hold due to bleeding AV Fistula.  ?Code Status: Full  ?Family Communication:  Family at Bedside ? ?Status is: Inpatient ?Remains inpatient appropriate because: Ongoing vascular evaluation, plan fistulogram today thereafter neurology is planning 5 days of IVIG. ? ? ? ? ? ? ?Subjective: ?Seen and examined at bedside.  No complaints this morning.  She tells me she is wants to get moving with her procedure ? ?Examination: ?Constitutional: Not in acute distress ?Respiratory: Clear to auscultation bilaterally ?Cardiovascular: Normal sinus rhythm, no rubs ?Abdomen: Nontender nondistended good bowel sounds ?Musculoskeletal: No edema noted ?Skin: No rashes seen ?Neurologic: CN 2-12 grossly intact.  And nonfocal ?Psychiatric: Normal judgment and insight. Alert and oriented x 3. Normal mood. ? ?Right upper extremity AV fistula ? ? ?  Objective: ?Vitals:  ? 10/15/21 1607 10/15/21 1958 10/16/21 0057 10/16/21 0402  ?BP: (!) 108/46 (!) 108/54 (!) 153/68 124/62  ?Pulse: 81 81 82 80  ?Resp: 18 18 16 16   ?Temp: 97.8 ?F (36.6 ?C) 98 ?F  (36.7 ?C) 97.7 ?F (36.5 ?C) 97.6 ?F (36.4 ?C)  ?TempSrc:   Oral Oral  ?SpO2: 99% 100% 100% 100%  ?Weight:      ?Height:      ? ? ?Intake/Output Summary (Last 24 hours) at 10/16/2021 9924 ?Last data filed at 10/15/2021 1724 ?Gross per 24 hour  ?Intake 360 ml  ?Output 300 ml  ?Net 60 ml  ? ?Filed Weights  ? 10/13/21 0525 10/14/21 0435 10/15/21 0533  ?Weight: 51.5 kg 51.3 kg 51.7 kg  ? ? ? ?Data Reviewed:  ? ?CBC: ?Recent Labs  ?Lab 10/12/21 ?2683 10/12/21 ?1140 10/14/21 ?0426 10/15/21 ?4196 10/16/21 ?2229  ?WBC 5.5 5.5 4.5 4.6 5.6  ?HGB 7.4* 7.6* 6.2* 8.2* 7.8*  ?HCT 24.2* 23.3* 19.6* 25.1* 23.6*  ?MCV 100.0 96.3 99.0 94.0 94.4  ?PLT 79* 68* 70* 67* 58*  ? ?Basic Metabolic Panel: ?Recent Labs  ?Lab 10/12/21 ?7989 10/12/21 ?1140 10/14/21 ?0426 10/15/21 ?2119 10/16/21 ?4174  ?NA 141 140 138 140 137  ?K 4.4 4.7 3.7 3.9 3.7  ?CL 104 102 102 100 100  ?CO2 22 21* 29 27 24   ?GLUCOSE 86 102* 101* 91 67*  ?BUN 45* 50* 40* 49* 58*  ?CREATININE 4.90* 5.28* 4.79* 6.17* 7.16*  ?CALCIUM 7.9* 8.0* 7.9* 8.8* 8.4*  ?MG  --   --   --  2.0 2.1  ?PHOS  --  4.6 4.5  --   --   ? ?GFR: ?Estimated Creatinine Clearance: 6.1 mL/min (A) (by C-G formula based on SCr of 7.16 mg/dL (H)). ?Liver Function Tests: ?Recent Labs  ?Lab 10/12/21 ?1140 10/14/21 ?0426  ?ALBUMIN 3.2* 2.7*  ? ?No results for input(s): LIPASE, AMYLASE in the last 168 hours. ?No results for input(s): AMMONIA in the last 168 hours. ?Coagulation Profile: ?Recent Labs  ?Lab 10/12/21 ?1230 10/13/21 ?0438 10/14/21 ?0426 10/15/21 ?0501 10/15/21 ?0912  ?INR 2.1* 2.1* 1.8* 1.6* 1.5*  ? ?Cardiac Enzymes: ?Recent Labs  ?Lab 10/11/21 ?2034  ?CKTOTAL 93  ? ?BNP (last 3 results) ?No results for input(s): PROBNP in the last 8760 hours. ?HbA1C: ?No results for input(s): HGBA1C in the last 72 hours. ? ?CBG: ?Recent Labs  ?Lab 10/15/21 ?2045 10/16/21 ?0814 10/16/21 ?0101 10/16/21 ?4818 10/16/21 ?5631  ?GLUCAP 70 83 84 69* 152*  ? ?Lipid Profile: ?No results for input(s): CHOL, HDL, LDLCALC, TRIG,  CHOLHDL, LDLDIRECT in the last 72 hours. ?Thyroid Function Tests: ?No results for input(s): TSH, T4TOTAL, FREET4, T3FREE, THYROIDAB in the last 72 hours. ?Anemia Panel: ?No results for input(s): VITAMINB12, FOLATE, FERRITIN, TIBC, IRON, RETICCTPCT in the last 72 hours. ?Sepsis Labs: ?No results for input(s): PROCALCITON, LATICACIDVEN in the last 168 hours. ? ?Recent Results (from the past 240 hour(s))  ?Resp Panel by RT-PCR (Flu A&B, Covid) Nasopharyngeal Swab     Status: None  ? Collection Time: 10/11/21  8:34 PM  ? Specimen: Nasopharyngeal Swab; Nasopharyngeal(NP) swabs in vial transport medium  ?Result Value Ref Range Status  ? SARS Coronavirus 2 by RT PCR NEGATIVE NEGATIVE Final  ?  Comment: (NOTE) ?SARS-CoV-2 target nucleic acids are NOT DETECTED. ? ?The SARS-CoV-2 RNA is generally detectable in upper respiratory ?specimens during the acute phase of infection. The lowest ?concentration of SARS-CoV-2 viral copies this assay can detect is ?[place]  copies/mL. A negative result does not preclude SARS-Cov-2 ?infection and should not be used as the sole basis for treatment or ?other patient management decisions. A negative result may occur with  ?improper specimen collection/handling, submission of specimen other ?than nasopharyngeal swab, presence of viral mutation(s) within the ?areas targeted by this assay, and inadequate number of viral ?copies(<138 copies/mL). A negative result must be combined with ?clinical observations, patient history, and epidemiological ?information. The expected result is Negative. ? ?Fact Sheet for Patients:  ?EntrepreneurPulse.com.au ? ?Fact Sheet for Healthcare Providers:  ?IncredibleEmployment.be ? ?This test is no t yet approved or cleared by the [place] FDA and  ?has been authorized for detection and/or diagnosis of SARS-CoV-2 by ?FDA under an Emergency Use Authorization (EUA). This EUA will remain  ?in effect (meaning this test can be used) for  the duration of the ?COVID-19 declaration under Section 564(b)(1) of the Act, 21 ?U.S.C.section 360bbb-3(b)(1), unless the authorization is terminated  ?or revoked sooner.  ? ? ?  ? Influenza A by [facility]

## 2021-10-16 NOTE — Op Note (Signed)
OPERATIVE NOTE ? ? ?PROCEDURE: ?Contrast injection right brachiocephalic AV access ?Percutaneous transluminal angioplasty and stent placement right brachiocephalic ? ?PRE-OPERATIVE DIAGNOSIS: Complication of dialysis access ?                                                      End Stage Renal Disease ? ?POST-OPERATIVE DIAGNOSIS: same as above  ? ?SURGEON: Katha Cabal, M.D. ? ?ANESTHESIA: Conscious sedation was administered under my direct supervision by the interventional radiology RN. IV Versed plus fentanyl were utilized. Continuous ECG, pulse oximetry and blood pressure was monitored throughout the entire procedure.  Conscious sedation was for a total of 31 minutes. ? ?ESTIMATED BLOOD LOSS: minimal ? ?FINDING(S): ?Greater than 90% narrowing in 3 locations within previously placed stents peripheral segment ? ?SPECIMEN(S):  None ? ?CONTRAST: 20 cc ? ?FLUOROSCOPY TIME: 2.0 minutes ? ?INDICATIONS: ?Tammie Klein is a 71 y.o. female who  presents with malfunctioning right arm AV access.  The patient is scheduled for angiography with possible intervention of the AV access.  The patient is aware the risks include but are not limited to: bleeding, infection, thrombosis of the cannulated access, and possible anaphylactic reaction to the contrast.  The patient acknowledges if the access can not be salvaged a tunneled catheter will be needed and will be placed during this procedure.  The patient is aware of the risks of the procedure and elects to proceed with the angiogram and intervention. ? ?DESCRIPTION: ?After full informed written consent was obtained, the patient was brought back to the Special Procedure suite and placed supine position.  Appropriate cardiopulmonary monitors were placed.  The right arm was prepped and draped in the standard fashion.  Appropriate timeout is called. The right AV access was cannulated with a micropuncture needle.  Cannulation was performed with ultrasound guidance. Ultrasound was  placed in a sterile sleeve, the AV access was interrogated and noted to be echolucent and compressible indicating patency. Image was recorded for the permanent record. The puncture is performed under continuous ultrasound visualization.   The microwire was advanced and the needle was exchanged for  a microsheath.  The J-wire was then advanced and a 6 Fr sheath inserted.  Hand injections were completed to image the access from the arterial anastomosis through the entire access.  The central venous structures were also imaged by hand injections. ? ?Based on the images,  3000 units of heparin was given and a wire was negotiated through the strictures within the venous portion of the graft.  An 8 mm x 100 mm Viabahn was deployed across the stenoses and postdilated with an 8 mm [place] balloon. ? ?Follow-up imaging demonstrates complete resolution of the stricture with rapid flow of contrast through the graft, the central venous anatomy is preserved. ? ?A 4-0 Monocryl purse-string suture was sewn around the sheath.  The sheath was removed and light pressure was applied.  A sterile bandage was applied to the puncture site. ? ?Interpretation: ?Initial images demonstrate there are 3 tandem lesions of greater than 90% within the peripheral segment these are all located within a previously placed stent.  More proximally the fistula is widely patent and the central veins are all widely patent.  Following angioplasty and stent placement there is now less than 5% residual stenosis throughout the fistula. ? ? ?COMPLICATIONS: None ? ?CONDITION:  Good ? ?Katha Cabal, M.D ?[facility] ?Office: [phone] ? ?10/16/2021 11:38 AM ?  ?

## 2021-10-16 NOTE — Progress Notes (Signed)
Patient refused bed alarm. Educated about risks and safety. Informed charge nurse.  ?

## 2021-10-17 DIAGNOSIS — E440 Moderate protein-calorie malnutrition: Secondary | ICD-10-CM | POA: Insufficient documentation

## 2021-10-17 DIAGNOSIS — G7001 Myasthenia gravis with (acute) exacerbation: Secondary | ICD-10-CM | POA: Diagnosis not present

## 2021-10-17 LAB — CBC
HCT: 23.4 % — ABNORMAL LOW (ref 36.0–46.0)
Hemoglobin: 7.6 g/dL — ABNORMAL LOW (ref 12.0–15.0)
MCH: 30.9 pg (ref 26.0–34.0)
MCHC: 32.5 g/dL (ref 30.0–36.0)
MCV: 95.1 fL (ref 80.0–100.0)
Platelets: 58 10*3/uL — ABNORMAL LOW (ref 150–400)
RBC: 2.46 MIL/uL — ABNORMAL LOW (ref 3.87–5.11)
RDW: 18 % — ABNORMAL HIGH (ref 11.5–15.5)
WBC: 4.7 10*3/uL (ref 4.0–10.5)
nRBC: 0 % (ref 0.0–0.2)

## 2021-10-17 LAB — GLUCOSE, CAPILLARY
Glucose-Capillary: 102 mg/dL — ABNORMAL HIGH (ref 70–99)
Glucose-Capillary: 154 mg/dL — ABNORMAL HIGH (ref 70–99)
Glucose-Capillary: 155 mg/dL — ABNORMAL HIGH (ref 70–99)

## 2021-10-17 LAB — BASIC METABOLIC PANEL
Anion gap: 7 (ref 5–15)
BUN: 26 mg/dL — ABNORMAL HIGH (ref 8–23)
CO2: 28 mmol/L (ref 22–32)
Calcium: 8 mg/dL — ABNORMAL LOW (ref 8.9–10.3)
Chloride: 98 mmol/L (ref 98–111)
Creatinine, Ser: 4.13 mg/dL — ABNORMAL HIGH (ref 0.44–1.00)
GFR, Estimated: 11 mL/min — ABNORMAL LOW (ref >60)
Glucose, Bld: 103 mg/dL — ABNORMAL HIGH (ref 70–99)
Potassium: 3.3 mmol/L — ABNORMAL LOW (ref 3.5–5.1)
Sodium: 133 mmol/L — ABNORMAL LOW (ref 135–145)

## 2021-10-17 LAB — MAGNESIUM: Magnesium: 1.7 mg/dL (ref 1.7–2.4)

## 2021-10-17 MED ORDER — APIXABAN 5 MG PO TABS
5.0000 mg | ORAL_TABLET | Freq: Two times a day (BID) | ORAL | Status: DC
  Administered 2021-10-17 – 2021-10-19 (×4): 5 mg via ORAL
  Filled 2021-10-17 (×5): qty 1

## 2021-10-17 MED ORDER — PROSOURCE PLUS PO LIQD
30.0000 mL | Freq: Three times a day (TID) | ORAL | Status: DC
  Administered 2021-10-17 – 2021-10-18 (×3): 30 mL via ORAL
  Filled 2021-10-17 (×8): qty 30

## 2021-10-17 MED ORDER — HEPARIN SODIUM (PORCINE) 5000 UNIT/ML IJ SOLN: 5000.0000 [IU] | Freq: Three times a day (TID) | INTRAMUSCULAR | Status: DC

## 2021-10-17 MED ORDER — PROSOURCE TF PO LIQD
45.0000 mL | Freq: Three times a day (TID) | ORAL | Status: DC
  Filled 2021-10-17: qty 45

## 2021-10-17 NOTE — Progress Notes (Signed)
?[facility]  ?ROUNDING NOTE  ? ?Subjective:  ? ?Tammie Klein is a 71 year old female with past medical histories including chronic heart failure, diabetes, hypertension, myasthenia gravis, and end-stage renal disease on hemodialysis.  Patient presents to the emergency department with shortness of breath and lower extremity weakness.  Patient has been admitted for Myasthenia gravis with acute exacerbation ([place]) [G70.01] ?Myasthenia gravis with (acute) exacerbation ([place]) [G70.01] ? ?Patient was recently moved to the area from [place] to live with her brother.  Patient recently started receiving outpatient dialysis treatments at [facility], on a Monday Wednesday Friday schedule.  ? ?Update ?Patient resting in bed ?States she feels well, but not sure why she is still admitted ?Reminded she had dialysis yesterday due to procedure on access ?Tolerated treatment well  ?Patient seen later sitting up in chair ? ? ?Objective:  ?Vital signs in last 24 hours:  ?Temp:  [97.5 ?F (36.4 ?C)-98.5 ?F (36.9 ?C)] 97.5 ?F (36.4 ?C) (04/26 1149) ?Pulse Rate:  [77-86] 86 (04/26 1149) ?Resp:  [16-33] 17 (04/26 1149) ?BP: (107-157)/(46-71) 126/47 (04/26 1149) ?SpO2:  [99 %-100 %] 99 % (04/26 1149) ?Weight:  [50.1 kg-51.2 kg] 50.1 kg (04/25 1738) ? ?Weight change:  ?Filed Weights  ? 10/15/21 0533 10/16/21 1402 10/16/21 1738  ?Weight: 51.7 kg 51.2 kg 50.1 kg  ? ? ?Intake/Output: ?I/O last 3 completed shifts: ?In: 84 [IV Piggyback:50] ?Out: 500 [Other:500] ?  ?Intake/Output this shift: ? Total I/O ?In: 43 [P.O.:476] ?Out: -  ? ?Physical Exam: ?General: NAD  ?Head: Normocephalic, atraumatic. Moist oral mucosal membranes  ?Eyes: Anicteric  ?Lungs:  Clear to auscultation, normal effort, room air  ?Heart: Regular rate and rhythm  ?Abdomen:  Soft, nontender, nondistended  ?Extremities: 1+ peripheral edema.  ?Neurologic: Nonfocal, moving all four extremities  ?Skin: No lesions  ?Access: Left aVF  ? ? ?Basic Metabolic  Panel: ?Recent Labs  ?Lab 10/12/21 ?1140 10/14/21 ?0426 10/15/21 ?0912 10/16/21 ?0352 10/17/21 ?7846  ?NA 140 138 140 137 133*  ?K 4.7 3.7 3.9 3.7 3.3*  ?CL 102 102 100 100 98  ?CO2 21* 29 27 24 28   ?GLUCOSE 102* 101* 91 67* 103*  ?BUN 50* 40* 49* 58* 26*  ?CREATININE 5.28* 4.79* 6.17* 7.16* 4.13*  ?CALCIUM 8.0* 7.9* 8.8* 8.4* 8.0*  ?MG  --   --  2.0 2.1 1.7  ?PHOS 4.6 4.5  --   --   --   ? ? ? ?Liver Function Tests: ?Recent Labs  ?Lab 10/12/21 ?1140 10/14/21 ?0426  ?ALBUMIN 3.2* 2.7*  ? ? ?No results for input(s): LIPASE, AMYLASE in the last 168 hours. ?No results for input(s): AMMONIA in the last 168 hours. ? ?CBC: ?Recent Labs  ?Lab 10/12/21 ?1140 10/14/21 ?0426 10/15/21 ?0912 10/16/21 ?0352 10/17/21 ?9629  ?WBC 5.5 4.5 4.6 5.6 4.7  ?HGB 7.6* 6.2* 8.2* 7.8* 7.6*  ?HCT 23.3* 19.6* 25.1* 23.6* 23.4*  ?MCV 96.3 99.0 94.0 94.4 95.1  ?PLT 68* 70* 67* 58* 58*  ? ? ? ?Cardiac Enzymes: ?Recent Labs  ?Lab 10/11/21 ?2034  ?CKTOTAL 93  ? ? ? ?BNP: ?Invalid input(s): POCBNP ? ?CBG: ?Recent Labs  ?Lab 10/16/21 ?1813 10/16/21 ?2040 10/16/21 ?2347 10/17/21 ?5284 10/17/21 ?1150  ?GLUCAP 75 130* 118* 102* 154*  ? ? ? ?Microbiology: ?Results for orders placed or performed during the hospital encounter of 10/11/21  ?Resp Panel by RT-PCR (Flu A&B, Covid) Nasopharyngeal Swab     Status: None  ? Collection Time: 10/11/21  8:34 PM  ? Specimen:  Nasopharyngeal Swab; Nasopharyngeal(NP) swabs in vial transport medium  ?Result Value Ref Range Status  ? SARS Coronavirus 2 by RT PCR NEGATIVE NEGATIVE Final  ?  Comment: (NOTE) ?SARS-CoV-2 target nucleic acids are NOT DETECTED. ? ?The SARS-CoV-2 RNA is generally detectable in upper respiratory ?specimens during the acute phase of infection. The lowest ?concentration of SARS-CoV-2 viral copies this assay can detect is ?138 copies/mL. A negative result does not preclude SARS-Cov-2 ?infection and should not be used as the sole basis for treatment or ?other patient management decisions. A negative  result may occur with  ?improper specimen collection/handling, submission of specimen other ?than nasopharyngeal swab, presence of viral mutation(s) within the ?areas targeted by this assay, and inadequate number of viral ?copies(<138 copies/mL). A negative result must be combined with ?clinical observations, patient history, and epidemiological ?information. The expected result is Negative. ? ?Fact Sheet for Patients:  ?EntrepreneurPulse.com.au ? ?Fact Sheet for Healthcare Providers:  ?IncredibleEmployment.be ? ?This test is no t yet approved or cleared by the [place] FDA and  ?has been authorized for detection and/or diagnosis of SARS-CoV-2 by ?FDA under an Emergency Use Authorization (EUA). This EUA will remain  ?in effect (meaning this test can be used) for the duration of the ?COVID-19 declaration under Section 564(b)(1) of the Act, 21 ?U.S.C.section 360bbb-3(b)(1), unless the authorization is terminated  ?or revoked sooner.  ? ? ?  ? Influenza A by PCR NEGATIVE NEGATIVE Final  ? Influenza B by PCR NEGATIVE NEGATIVE Final  ?  Comment: (NOTE) ?The Xpert Xpress SARS-CoV-2/FLU/RSV plus assay is intended as an aid ?in the diagnosis of influenza from Nasopharyngeal swab specimens and ?should not be used as a sole basis for treatment. Nasal washings and ?aspirates are unacceptable for Xpert Xpress SARS-CoV-2/FLU/RSV ?testing. ? ?Fact Sheet for Patients: ?EntrepreneurPulse.com.au ? ?Fact Sheet for Healthcare Providers: ?IncredibleEmployment.be ? ?This test is not yet approved or cleared by the [place] FDA and ?has been authorized for detection and/or diagnosis of SARS-CoV-2 by ?FDA under an Emergency Use Authorization (EUA). This EUA will remain ?in effect (meaning this test can be used) for the duration of the ?COVID-19 declaration under Section 564(b)(1) of the Act, 21 U.S.C. ?section 360bbb-3(b)(1), unless the authorization is  terminated or ?revoked. ? ?Performed at [facility], [place], ?[place] [address] ?  ? ? ?Coagulation Studies: ?Recent Labs  ?  10/15/21 ?0501 10/15/21 ?0912  ?LABPROT 18.6* 17.8*  ?INR 1.6* 1.5*  ? ? ? ?Urinalysis: ?No results for input(s): COLORURINE, LABSPEC, [place], GLUCOSEU, HGBUR, BILIRUBINUR, KETONESUR, PROTEINUR, UROBILINOGEN, NITRITE, LEUKOCYTESUR in the last 72 hours. ? ?Invalid input(s): APPERANCEUR  ? ? ?Imaging: ?PERIPHERAL VASCULAR CATHETERIZATION ? ?Result Date: 10/16/2021 ?See surgical note for result.  ? ? ?Medications:  ? ? ? ? ? Chlorhexidine Gluconate Cloth  6 each Topical Q0600  ? escitalopram  5 mg Oral Daily  ? famotidine  20 mg Oral Daily  ? feeding supplement  237 mL Oral TID BM  ? furosemide  80 mg Oral BID  ? insulin aspart  0-5 Units Subcutaneous QHS  ? insulin aspart  0-9 Units Subcutaneous TID WC  ? isosorbide-hydrALAZINE  1 tablet Oral BID  ? latanoprost  1 drop Both Eyes QHS  ? lidocaine  1 patch Transdermal Q24H  ? melatonin  2.5 mg Oral QHS  ? mirtazapine  7.5 mg Oral QHS  ? multivitamin with minerals  1 tablet Oral Daily  ? pentoxifylline  400 mg Oral Daily  ? predniSONE  5 mg Oral Q breakfast  ? sacubitril-valsartan  1 tablet Oral BID  ? ?acetaminophen **OR** acetaminophen, HYDROmorphone (DILAUDID) injection, hydrOXYzine, loperamide, nitroGLYCERIN, ondansetron **OR** ondansetron (ZOFRAN) IV, ondansetron (ZOFRAN) IV ? ?Assessment/ Plan:  ?Tammie Klein is a 71 y.o.  female with past medical histories including chronic heart failure, diabetes, hypertension, myasthenia gravis, and end-stage renal disease on hemodialysis.  Patient presents to the emergency department with shortness of breath and lower extremity weakness.  Patient has been admitted for Myasthenia gravis with acute exacerbation ([place]) [G70.01] ?Myasthenia gravis with (acute) exacerbation ([place]) [G70.01] ? ?[facility]/MWF/left aVF ? ?End-stage renal disease on hemodialysis.  Will  maintain outpatient schedule, if possible.  Received dialysis yesterday after fisutogram. UF achieved 527ml. Fistulogram performed due to prolonged bleeding post treatment. 90% stenosis identified with stent placeme

## 2021-10-17 NOTE — Progress Notes (Signed)
? ? ? ?Progress Note  ? ? ?Tammie Klein  EZM:[id number] DOB: [birth year]  DOA: 10/11/2021 ?PCP: Pcp, No  ? ? ? ? ?Brief Narrative:  ? ? ?Medical records reviewed and are as summarized below: ? ?Tammie Klein is a 71 y.o. 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  She recently moved from [place] to [place] to live with her son and daughter-in-law.  She presented to the hospital with shortness of breath and lower extremity weakness. ? ? ? ? ? ?Assessment/Plan:  ? ?Principal Problem: ?  Myasthenia gravis with (acute) exacerbation ([place]) ?Active Problems: ?  Major depressive disorder, recurrent episode, moderate ([place]) ?  End-stage renal disease on hemodialysis ([place]) ?  Dyslipidemia ?  Essential hypertension ?  Chronic systolic CHF (congestive heart failure) ([place]) ?  Pain of right upper extremity ?  Malnutrition of moderate degree ? ? ?Nutrition Problem: Moderate Malnutrition ?Etiology: chronic illness (ESRD on HD) ? ?Signs/Symptoms: mild fat depletion, moderate fat depletion, mild muscle depletion, moderate muscle depletion ? ? ?Body mass index is 19.41 kg/m?. ? ? ?Myasthenia gravis exacerbation with lower extremity weakness and dysphagia, history of thymectomy: She has been evaluated by the neurologist with plan to initiate IV immunoglobulin.  Continue prednisone.  Follow-up with neurologist for further recommendations. ? ?ESRD on HD, complication of dialysis access: S/p stent placement to right brachiocephalic AV access.  Follow-up with nephrologist for hemodialysis. ? ?Acute on chronic anemia: S/p transfusion with 1 unit of PRBCs on 10/14/2021.  H&H is stable ? ?Type II DM, recent hypoglycemia: NovoLog as needed for hyperglycemia ? ?History of recurrent lower extremity DVTs: Restart Coumadin ? ?Chronic systolic CHF  with EF of 50%, CAD s/p PCI: Continue Lasix, Entresto and BiDil.  She is not on beta-blockers because of myasthenia gravis. ? ?Debility: PT and OT recommended discharge to SNF.  Follow-up with social worker to assist with disposition. ? ?Other comorbidities include hyperlipidemia, osteoarthritis ? ? ?Diet Order   ? ?       ?  Diet regular Room service appropriate? Yes; Fluid consistency: Thin  Diet effective now       ?  ? ?  ?  ? ?  ? ? ? ? ? ? ?Consultants: ?Nephrologist, neurologist, vascular surgeon ? ?Procedures: ?Percutaneous transluminal angioplasty and stent placement of right brachiocephalic AV access on 3/54/6568 ? ? ? ?Medications:  ? ? (feeding supplement) PROSource Plus  30 mL Oral TID BM  ? Chlorhexidine Gluconate Cloth  6 each Topical Q0600  ? escitalopram  5 mg Oral Daily  ? famotidine  20 mg Oral Daily  ? furosemide  80 mg Oral BID  ? heparin injection (subcutaneous)  5,000 Units Subcutaneous Q8H  ? insulin aspart  0-5 Units Subcutaneous QHS  ? insulin aspart  0-9 Units Subcutaneous TID WC  ? isosorbide-hydrALAZINE  1 tablet Oral BID  ? latanoprost  1 drop Both Eyes QHS  ? lidocaine  1 patch Transdermal Q24H  ? melatonin  2.5 mg Oral QHS  ? mirtazapine  7.5 mg Oral QHS  ? multivitamin with minerals  1 tablet Oral Daily  ? pentoxifylline  400 mg Oral Daily  ? predniSONE  5 mg Oral Q breakfast  ? sacubitril-valsartan  1 tablet Oral BID  ? ?Continuous Infusions: ? ? ?Anti-infectives (From admission, onward)  ? ? Start     Dose/Rate Route  Frequency Ordered Stop  ? 10/16/21 1039  ceFAZolin (ANCEF) IVPB 1 g/50 mL premix       ? 1 g ?100 mL/hr over 30 Minutes Intravenous 30 min pre-op 10/16/21 1039 10/16/21 1244  ? ?  ? ? ? ? ? ? ? ? ? ?Family Communication/Anticipated D/C date and plan/Code Status  ? ?DVT prophylaxis: heparin injection 5,000 Units Start: 10/17/21 2200 ? ? ?  Code Status: Full Code ? ?Family Communication: None ?Disposition Plan: Plan to discharge to SNF after completion of IV  immunoglobulin ? ? ?Status is: Inpatient ?Remains inpatient appropriate because: Myasthenia gravis exacerbation ? ? ? ? ? ? ?Subjective:  ? ?Interval events noted.  She complains of bilateral lower extremity weakness and difficulty swallowing liquids ? ?Objective:  ? ? ?Vitals:  ? 10/17/21 1530 10/17/21 1545 10/17/21 1600 10/17/21 1615  ?BP: (!) 106/48 (!) 113/48 (!) 116/56 (!) 119/50  ?Pulse: 86 85 85 88  ?Resp: (!) 27 (!) 22 20 (!) 22  ?Temp:      ?TempSrc:      ?SpO2: 100% 100% 100% 100%  ?Weight:      ?Height:      ? ?No data found. ? ? ?Intake/Output Summary (Last 24 hours) at 10/17/2021 1710 ?Last data filed at 10/17/2021 1300 ?Gross per 24 hour  ?Intake 716 ml  ?Output 500 ml  ?Net 216 ml  ? ?Filed Weights  ? 10/16/21 1402 10/16/21 1738 10/17/21 1505  ?Weight: 51.2 kg 50.1 kg 49.7 kg  ? ? ?Exam: ? ?GEN: NAD ?SKIN: Warm and dry ?EYES: No pallor or icterus ?ENT: MMM ?CV: RRR ?PULM: CTA B ?ABD: soft, ND, NT, +BS ?CNS: AAO x 3, non focal ?EXT: No edema or tenderness ? ? ? ?  ? ? ?Data Reviewed:  ? ?I have personally reviewed following labs and imaging studies: ? ?Labs: ?Labs show the following:  ? ?Basic Metabolic Panel: ?Recent Labs  ?Lab 10/12/21 ?1140 10/14/21 ?0426 10/15/21 ?0912 10/16/21 ?0352 10/17/21 ?1607  ?NA 140 138 140 137 133*  ?K 4.7 3.7 3.9 3.7 3.3*  ?CL 102 102 100 100 98  ?CO2 21* 29 27 24 28   ?GLUCOSE 102* 101* 91 67* 103*  ?BUN 50* 40* 49* 58* 26*  ?CREATININE 5.28* 4.79* 6.17* 7.16* 4.13*  ?CALCIUM 8.0* 7.9* 8.8* 8.4* 8.0*  ?MG  --   --  2.0 2.1 1.7  ?PHOS 4.6 4.5  --   --   --   ? ?GFR ?Estimated Creatinine Clearance: 10.2 mL/min (A) (by C-G formula based on SCr of 4.13 mg/dL (H)). ?Liver Function Tests: ?Recent Labs  ?Lab 10/12/21 ?1140 10/14/21 ?0426  ?ALBUMIN 3.2* 2.7*  ? ?No results for input(s): LIPASE, AMYLASE in the last 168 hours. ?No results for input(s): AMMONIA in the last 168 hours. ?Coagulation profile ?Recent Labs  ?Lab 10/12/21 ?1230 10/13/21 ?0438 10/14/21 ?0426 10/15/21 ?0501  10/15/21 ?0912  ?INR 2.1* 2.1* 1.8* 1.6* 1.5*  ? ? ?CBC: ?Recent Labs  ?Lab 10/12/21 ?1140 10/14/21 ?0426 10/15/21 ?0912 10/16/21 ?0352 10/17/21 ?3710  ?WBC 5.5 4.5 4.6 5.6 4.7  ?HGB 7.6* 6.2* 8.2* 7.8* 7.6*  ?HCT 23.3* 19.6* 25.1* 23.6* 23.4*  ?MCV 96.3 99.0 94.0 94.4 95.1  ?PLT 68* 70* 67* 58* 58*  ? ?Cardiac Enzymes: ?Recent Labs  ?Lab 10/11/21 ?2034  ?CKTOTAL 93  ? ?BNP (last 3 results) ?No results for input(s): PROBNP in the last 8760 hours. ?CBG: ?Recent Labs  ?Lab 10/16/21 ?1813 10/16/21 ?2040 10/16/21 ?2347 10/17/21 ?6269 10/17/21 ?1150  ?  GLUCAP 75 130* 118* 102* 154*  ? ?D-Dimer: ?No results for input(s): DDIMER in the last 72 hours. ?Hgb A1c: ?No results for input(s): HGBA1C in the last 72 hours. ?Lipid Profile: ?No results for input(s): CHOL, HDL, LDLCALC, TRIG, CHOLHDL, LDLDIRECT in the last 72 hours. ?Thyroid function studies: ?No results for input(s): TSH, T4TOTAL, T3FREE, THYROIDAB in the last 72 hours. ? ?Invalid input(s): FREET3 ?Anemia work up: ?No results for input(s): VITAMINB12, FOLATE, FERRITIN, TIBC, IRON, RETICCTPCT in the last 72 hours. ?Sepsis Labs: ?Recent Labs  ?Lab 10/14/21 ?0426 10/15/21 ?0912 10/16/21 ?0352 10/17/21 ?2951  ?WBC 4.5 4.6 5.6 4.7  ? ? ?Microbiology ?Recent Results (from the past 240 hour(s))  ?Resp Panel by RT-PCR (Flu A&B, Covid) Nasopharyngeal Swab     Status: None  ? Collection Time: 10/11/21  8:34 PM  ? Specimen: Nasopharyngeal Swab; Nasopharyngeal(NP) swabs in vial transport medium  ?Result Value Ref Range Status  ? SARS Coronavirus 2 by RT PCR NEGATIVE NEGATIVE Final  ?  Comment: (NOTE) ?SARS-CoV-2 target nucleic acids are NOT DETECTED. ? ?The SARS-CoV-2 RNA is generally detectable in upper respiratory ?specimens during the acute phase of infection. The lowest ?concentration of SARS-CoV-2 viral copies this assay can detect is ?138 copies/mL. A negative result does not preclude SARS-Cov-2 ?infection and should not be used as the sole basis for treatment or ?other  patient management decisions. A negative result may occur with  ?improper specimen collection/handling, submission of specimen other ?than nasopharyngeal swab, presence of viral mutation(s) within the ?areas targeted b

## 2021-10-17 NOTE — Progress Notes (Addendum)
Patient off unit at HD. Neurology will f/u with patient tomorrow AM for consideration of IVIG for tx myasthenia exacerbation. ? ?Su Monks, MD ?[facility] ?[phone] ? ?If 7pm- 7am, please page neurology on call as listed in [place]. ? ?

## 2021-10-17 NOTE — Consult Note (Signed)
ANTICOAGULATION CONSULT NOTE  ? ?Pharmacy Consult for Eliquis ?Indication: Hx of DVT ? ?Allergies  ?Allergen Reactions  ? Ciprofloxacin Other (See Comments) and Nausea And Vomiting  ?  Muscle aches ?Muscle aches ?Muscle aches ?  ? Buprenorphine Hcl Nausea And Vomiting  ? Solifenacin Itching and Other (See Comments)  ?  Other reaction(s): Other ?Other reaction(s): Other ?Other reaction(s): Other ?  ? ? ?Patient Measurements: ?Height: 5\' 3"  (160 cm) ?Weight: 49.5 kg (109 lb 2 oz) ?IBW/kg (Calculated) : 52.4 ? ? ?Vital Signs: ?Temp: 98.5 ?F (36.9 ?C) (04/26 1900) ?Temp Source: Oral (04/26 1900) ?BP: 137/110 (04/26 1902) ?Pulse Rate: 99 (04/26 1902) ? ?Labs: ?Recent Labs  ?  10/15/21 ?0501 10/15/21 ?0912 10/15/21 ?3570 10/16/21 ?1779 10/17/21 ?3903  ?HGB  --  8.2*   < > 7.8* 7.6*  ?HCT  --  25.1*  --  23.6* 23.4*  ?PLT  --  67*  --  58* 58*  ?LABPROT 18.6* 17.8*  --   --   --   ?INR 1.6* 1.5*  --   --   --   ?CREATININE  --  6.17*  --  7.16* 4.13*  ? < > = values in this interval not displayed.  ? ? ? ?Estimated Creatinine Clearance: 10.2 mL/min (A) (by C-G formula based on SCr of 4.13 mg/dL (H)). ? ? ?Medical History: ?Past Medical History:  ?Diagnosis Date  ? Blood transfusion without reported diagnosis   ? CHF (congestive heart failure) ([place])   ? Diabetes mellitus without complication ([place])   ? Hypertension   ? Renal disorder   ? ? ?Medications:  ?Warfarin PTA regimen: 1 mg daily ? ?DDIs: reviewed  ? ?Assessment: ?71 year old female with PMH ofchronic heart failure,  myasthenia gravis, and end-stage renal disease on hemodialysis and DVT on warfarin regimen outlined above. Admitted to [facility] 10/11/21 with SOB/weakness. Pharmacy has been consulted for switching patient from warfarin to Eliquis. Pt wants to stop warfarin and try Eliquis.  ? ? ?Goal of Therapy:  ?Monitor platelets by anticoagulation protocol: Yes ?  ?Plan:  ?INR 1.5 today, okay to start Eliquis per MD ?Start Eliquis 5mg  BID for recurrent VTE  prophylaxis ? ?Daijon Wenke Rodriguez-Guzman PharmD, BCPS ?10/17/2021 8:49 PM ? ? ?

## 2021-10-17 NOTE — Progress Notes (Signed)
Nif is -20 and ?Fvc is 0.99 L ?

## 2021-10-17 NOTE — Progress Notes (Signed)
Patient refused bed alarm. Educated on the risks and safety. Patient is alert and oriented x4. ?

## 2021-10-17 NOTE — Progress Notes (Signed)
Nutrition Follow-up ? ?DOCUMENTATION CODES:  ? ?Non-severe (moderate) malnutrition in context of chronic illness ? ?INTERVENTION:  ? ?-Liberalize diet to regular to provide widest variety of food selections ?-Continue renal MVI daily ?-D/c Ensure Enlive  ?-30 ml Prosource Plus TID, each supplement provides 100 kcals and 15 grams ? ?NUTRITION DIAGNOSIS:  ? ?Moderate Malnutrition related to chronic illness (ESRD on HD) as evidenced by mild fat depletion, moderate fat depletion, mild muscle depletion, moderate muscle depletion. ? ?Ongoing ? ?GOAL:  ? ?Patient will meet greater than or equal to 90% of their needs ? ?Progressing  ? ?MONITOR:  ? ?PO intake, Supplement acceptance, Labs, Weight trends, Skin, I & O's ? ?REASON FOR ASSESSMENT:  ? ?Malnutrition Screening Tool ?  ? ?ASSESSMENT:  ? ?71 y/o female with h/o ESRD on HD, HTN, HLD, DM, CHF, DVT and myasthenia gravis who is admitted with acute exacerbation ? ?4/25- s/p PROCEDURE: ?Contrast injection right brachiocephalic AV access ?Percutaneous transluminal angioplasty and stent placement right brachiocephalic ? ?Reviewed I/O's: -450 ml x 24 hours and +880 ml x 24 hours ? ?Spoke with pt, who was sitting in recliner chair at time of visit. Pt reports feeling poorly. She reports her appetite has improved, but shares that she feels like she is unable to get anything she likes to eat due to restrictions of renal, carb modified diet.  ? ?Pt shares that she started HD about 4 years ago and lost 40# upon the initiation of HD as she "fell into a deep depression" after starting HD. Per pt, she typically does not eat well at home and has been craving potato chips. She was receiving almond protein bars and Nepro shakes at HD. Pt reports she does not like the Ensure ordered secondary to side effect of diarrhea. She does not like Nepro, as she consumed it regularly in the past and has gotten tired of it.  ? ?Pt unsure of dry weight. She reports her HD treatments have been  challenging due to bleeding port and is hopeful that fistulagram has resolved this problem. She shares that her labs are always WDL at home HD center.  ? ?Discussed importance of good meal and supplement intake to promote healing. Pt amenable to try Prosource supplements. RD will also liberalize diet to provide widest variety of meal selections.  ? ?Medications reviewed and include lasix, melatonin, and prednisone.  ? ?Labs reviewed: Na: 133, K: 3.3, CBGS: 75-154 (inpatient orders for glycemic control are 0-5 units insulin aspart daily at bedtime and 0-9 units inuslin aspart TID with meals).   ? ?NUTRITION - FOCUSED PHYSICAL EXAM: ? ?Flowsheet Row Most Recent Value  ?Orbital Region Moderate depletion  ?Upper Arm Region Moderate depletion  ?Thoracic and Lumbar Region Moderate depletion  ?Buccal Region Moderate depletion  ?Temple Region Moderate depletion  ?Clavicle Bone Region Severe depletion  ?Clavicle and Acromion Bone Region Severe depletion  ?Scapular Bone Region Severe depletion  ?Dorsal Hand Moderate depletion  ?Patellar Region Moderate depletion  ?Anterior Thigh Region Moderate depletion  ?Posterior Calf Region Moderate depletion  ?Edema (RD Assessment) None  ?Hair Reviewed  ?Eyes Reviewed  ?Mouth Reviewed  ?Skin Reviewed  ?Nails Reviewed  ? ?  ? ? ?Diet Order:   ?Diet Order   ? ?       ?  Diet regular Room service appropriate? Yes; Fluid consistency: Thin  Diet effective now       ?  ? ?  ?  ? ?  ? ? ?EDUCATION NEEDS:  ? ?  Education needs have been addressed ? ?Skin:  Skin Assessment: Reviewed RN Assessment ? ?Last BM:  10/17/21 ? ?Height:  ? ?Ht Readings from Last 1 Encounters:  ?10/13/21 5\' 3"  (1.6 m)  ? ? ?Weight:  ? ?Wt Readings from Last 1 Encounters:  ?10/17/21 49.7 kg  ? ? ?Ideal Body Weight:  52.3 kg ? ?BMI:  Body mass index is 19.41 kg/m?. ? ?Estimated Nutritional Needs:  ? ?Kcal:  1550-1750 ? ?Protein:  85-100 grams ? ?Fluid:  1000 ml + UOP ? ? ? ?Loistine Chance, RD, LDN, CDCES ?Registered Dietitian  II ?Certified Diabetes Care and Education Specialist ?Please refer to [facility] for RD and/or RD on-call/weekend/after hours pager  ?

## 2021-10-17 NOTE — Plan of Care (Signed)

## 2021-10-17 NOTE — Plan of Care (Signed)
Consult request for Tammie Klein in place. Returned to see patient today as she was in dialysis at the time of my visit yesterday. Patient is currently off unit at this time.   ?

## 2021-10-17 NOTE — Progress Notes (Signed)
Occupational Therapy Treatment ?Patient Details ?Name: Tammie Klein ?MRN: [id number] ?DOB: December 10, 1950 ?Today's Date: 10/17/2021 ? ? ?History of present illness Pt is a 71 y.o. female presenting to hospital 4/20 with c/o SOB and swelling to legs; gradually worsening B LE weakness over past several days; deteriorating health over last several months.  Recently brought to area from home in [place] by her family so pt could be cared for by family here.  Pt admitted with myasthenia gravis with acute exacerbation.  PMH includes myasthenia gravis, ESRD on HD MWF, CHF, DM, htn. ?  ?OT comments ? Chart reviewed, pt greeted in bathroom on toilet. Tx session targeted progressing strength/endurance in setting of ADLs to facilitate safe completion of self care tasks. Pt performed STS with CGA with RW, MOD A required for toileting for thoroughness. Grooming tasks completed at sink level with CGA. Supervision-CGA required for functional ambulation in room with RW due to reported dizziness. BP 139/55 when seated in chair. Pt is left in bedside chair, NAD all needs met. OT will continue to follow.   ? ?Recommendations for follow up therapy are one component of a multi-disciplinary discharge planning process, led by the attending physician.  Recommendations may be updated based on patient status, additional functional criteria and insurance authorization. ?   ?Follow Up Recommendations ? Skilled nursing-short term rehab (<3 hours/day)  ?  ?Assistance Recommended at Discharge Intermittent Supervision/Assistance  ?Patient can return home with the following ? A little help with walking and/or transfers;A little help with bathing/dressing/bathroom;Assistance with cooking/housework;Assist for transportation;Direct supervision/assist for medications management;Help with stairs or ramp for entrance ?  ?Equipment Recommendations ?    ?  ?Recommendations for Other Services   ? ?  ?Precautions / Restrictions Precautions ?Precautions:  Fall ?Restrictions ?Weight Bearing Restrictions: No  ? ? ?  ? ?Mobility Bed Mobility ?  ?  ?  ?  ?  ?  ?  ?General bed mobility comments: NT pt on toilet when OT arrived ?  ? ?Transfers ?Overall transfer level: Needs assistance ?Equipment used: Rolling walker (2 wheels) ?Transfers: Sit to/from Stand ?Sit to Stand: Min guard ?  ?  ?  ?  ?  ?  ?  ?  ?Balance Overall balance assessment: Needs assistance ?Sitting-balance support: No upper extremity supported, Feet supported ?Sitting balance-Leahy Scale: Good ?  ?  ?Standing balance support: Bilateral upper extremity supported ?Standing balance-Leahy Scale: Fair ?  ?  ?  ?  ?  ?  ?  ?  ?  ?  ?  ?  ?   ? ?ADL either performed or assessed with clinical judgement  ? ?ADL Overall ADL's : Needs assistance/impaired ?Eating/Feeding: Set up;Sitting ?  ?Grooming: Wash/dry hands;Wash/dry face;Standing;Min guard ?  ?  ?  ?  ?  ?Upper Body Dressing : Set up;Sitting ?  ?  ?  ?Toilet Transfer: Min guard;Rolling walker (2 wheels) ?  ?Toileting- Clothing Manipulation and Hygiene: Moderate assistance;Maximal assistance ?Toileting - Clothing Manipulation Details (indicate cue type and reason): peri care for thoroughness ?  ?  ?Functional mobility during ADLs: Supervision/safety;Min guard;Rolling walker (2 wheels) ?  ?  ? ?Extremity/Trunk Assessment   ?  ?  ?  ?  ?  ? ?Vision   ?  ?  ?Perception   ?  ?Praxis   ?  ? ?Cognition Arousal/Alertness: Awake/alert ?Behavior During Therapy: [facility] for tasks assessed/performed ?Overall Cognitive Status: Within Functional Limits for tasks assessed ?  ?  ?  ?  ?  ?  ?  ?  ?  ?  ?  ?  ?  ?  ?  ?  ?  General Comments: alert and oriented x4 ?  ?  ?   ?Exercises   ? ?  ?Shoulder Instructions   ? ? ?  ?General Comments pt with reported dizziness throughout tx session  ? ? ?Pertinent Vitals/ Pain       Pain Assessment ?Pain Assessment: No/denies pain ? ?Home Living   ?  ?  ?  ?  ?  ?  ?  ?  ?  ?  ?  ?  ?  ?  ?  ?  ?  ?  ? ?  ?Prior Functioning/Environment    ?   ?  ?  ?   ? ?Frequency ? Min 2X/week  ? ? ? ? ?  ?Progress Toward Goals ? ?OT Goals(current goals can now be found in the care plan section) ? Progress towards OT goals: Progressing toward goals ? ?Acute Rehab OT Goals ?Patient Stated Goal: get stronger ?OT Goal Formulation: With patient ?Time For Goal Achievement: 10/31/21 ?Potential to Achieve Goals: Good  ?Plan Discharge plan remains appropriate   ? ?Co-evaluation ? ? ?   ?  ?  ?  ?  ? ?  ?AM-PAC OT "6 Clicks" Daily Activity     ?Outcome Measure ? ? Help from another person eating meals?: None ?Help from another person taking care of personal grooming?: None ?Help from another person toileting, which includes using toliet, bedpan, or urinal?: A Lot ?Help from another person bathing (including washing, rinsing, drying)?: A Little ?Help from another person to put on and taking off regular upper body clothing?: None ?Help from another person to put on and taking off regular lower body clothing?: A Lot ?6 Click Score: 19 ? ?  ?End of Session Equipment Utilized During Treatment: Rolling walker (2 wheels) ? ?OT Visit Diagnosis: Unsteadiness on feet (R26.81);Muscle weakness (generalized) (M62.81) ?  ?Activity Tolerance Patient tolerated treatment well ?  ?Patient Left with call bell/phone within reach;in chair;with chair alarm set ?  ?Nurse Communication Mobility status ?  ? ?   ? ?Time: 8828-0034 ?OT Time Calculation (min): 16 min ? ?Charges: OT General Charges ?$OT Visit: 1 Visit ?OT Treatments ?$Self Care/Home Management : 8-22 mins ? ?Shanon Payor, OTD OTR/L  ?10/17/21, 1:01 PM  ?

## 2021-10-17 NOTE — Progress Notes (Signed)
NIF: -17 ?FVC: 1.12L ?FEV1:  0.85L ?FEV1/FVC:  76.07% ? ?Patient participated to the best of her ability but states she is tired from dialysis and unable to give it her best at this time. ?

## 2021-10-17 NOTE — Progress Notes (Signed)
Physical Therapy Treatment ?Patient Details ?Name: Tammie Klein ?MRN: [id number] ?DOB: [birth year] ?Today's Date: 10/17/2021 ? ? ?History of Present Illness Pt is a 71 y.o. female presenting to hospital 4/20 with c/o SOB and swelling to legs; gradually worsening B LE weakness over past several days; deteriorating health over last several months.  Recently brought to area from home in [place] by her family so pt could be cared for by family here.  Pt admitted with myasthenia gravis with acute exacerbation.  PMH includes myasthenia gravis, ESRD on HD MWF, CHF, DM, htn. ? ?  ?PT Comments  ? ? Pt was hesitant to do a lot of PT/activity but once encouraged to do so she was able to do a few bouts of ambulation and seated exercises.  She had some issue with tolerance citing lightheadedness with each bout of standing activity.  BP checked multiple times shortly after sitting back down; each time in the 120s/~50 range).  She was pleasant and motivated but ultimately struggled to tolerate more than some minimal standing tasks before needing to sit.    ?Recommendations for follow up therapy are one component of a multi-disciplinary discharge planning process, led by the attending physician.  Recommendations may be updated based on patient status, additional functional criteria and insurance authorization. ? ?Follow Up Recommendations ? Skilled nursing-short term rehab (<3 hours/day) ?  ?  ?Assistance Recommended at Discharge Frequent or constant Supervision/Assistance  ?Patient can return home with the following A little help with walking and/or transfers;A little help with bathing/dressing/bathroom;Assistance with cooking/housework;Help with stairs or ramp for entrance;Assist for transportation ?  ?Equipment Recommendations ? Rolling walker (2 wheels);BSC/3in1;Wheelchair (measurements PT);Wheelchair cushion (measurements PT)  ?  ?Recommendations for Other Services   ? ? ?  ?Precautions / Restrictions Precautions ?Precautions:  Fall ?Restrictions ?Weight Bearing Restrictions: No  ?  ? ?Mobility ? Bed Mobility ?Overal bed mobility: Needs Assistance ?Bed Mobility: Supine to Sit ?  ?  ?Supine to sit: Min guard ?  ?  ?General bed mobility comments: Pt needing some extra time, cuing and use of rails, but was able to assume sitting EOB w/o direct assist ?  ? ?Transfers ?Overall transfer level: Needs assistance ?Equipment used: Rolling walker (2 wheels) ?Transfers: Sit to/from Stand ?Sit to Stand: Min guard, Min assist ?  ?  ?  ?  ?  ?General transfer comment: Pt unable to rise on first attempt, cues for set up and positioning, she was able to rise 3 times t/o the session with just CGA but plenty of encouragement ?  ? ?Ambulation/Gait ?Ambulation/Gait assistance: Min assist ?Gait Distance (Feet): 40 Feet ?Assistive device: Rolling walker (2 wheels) ?  ?  ?  ?  ?General Gait Details: 19ft then after rest break 5ft.  Pt with lightheadedness with "prolonged" ambulation and despite great effort and motivation she needed to sit and rest.  BP in each instance was low but not critical (120s/~50). ? ? ?Stairs ?  ?  ?  ?  ?  ? ? ?Wheelchair Mobility ?  ? ?Modified Rankin (Stroke Patients Only) ?  ? ? ?  ?Balance Overall balance assessment: Needs assistance ?Sitting-balance support: No upper extremity supported, Feet supported ?Sitting balance-Leahy Scale: Good ?  ?  ?Standing balance support: Bilateral upper extremity supported ?Standing balance-Leahy Scale: Fair ?Standing balance comment: BUE support on walke, no LOBs but does demonstrate general weakness, hesitancy and consistently having lightheadedness with "prolonged" standing activity ?  ?  ?  ?  ?  ?  ?  ?  ?  ?  ?  ?  ? ?  ?  Cognition Arousal/Alertness: Awake/alert ?Behavior During Therapy: [facility] for tasks assessed/performed ?Overall Cognitive Status: Within Functional Limits for tasks assessed ?  ?  ?  ?  ?  ?  ?  ?  ?  ?  ?  ?  ?  ?  ?  ?  ?  ?  ?  ? ?  ?Exercises General Exercises - Lower  Extremity ?Ankle Circles/Pumps: AROM, 10 reps ?Long Arc Quad: Strengthening, 10 reps ?Hip ABduction/ADduction: Strengthening, 10 reps ?Hip Flexion/Marching: Strengthening, 10 reps, Seated ? ?  ?General Comments General comments (skin integrity, edema, etc.): attempted some HHA standing balance exercises with minimal tolerance due to some lightheadedness ?  ?  ? ?Pertinent Vitals/Pain Pain Assessment ?Pain Assessment: No/denies pain  ? ? ?Home Living   ?  ?  ?  ?  ?  ?  ?  ?  ?  ?   ?  ?Prior Function    ?  ?  ?   ? ?PT Goals (current goals can now be found in the care plan section) Progress towards PT goals: Progressing toward goals ? ?  ?Frequency ? ? ? Min 2X/week ? ? ? ?  ?PT Plan Current plan remains appropriate  ? ? ?Co-evaluation   ?  ?  ?  ?  ? ?  ?AM-PAC PT "6 Clicks" Mobility   ?Outcome Measure ? Help needed turning from your back to your side while in a flat bed without using bedrails?: None ?Help needed moving from lying on your back to sitting on the side of a flat bed without using bedrails?: None ?Help needed moving to and from a bed to a chair (including a wheelchair)?: A Little ?Help needed standing up from a chair using your arms (e.g., wheelchair or bedside chair)?: A Little ?Help needed to walk in hospital room?: A Little ?Help needed climbing 3-5 steps with a railing? : A Lot ?6 Click Score: 19 ? ?  ?End of Session Equipment Utilized During Treatment: Gait belt ?Activity Tolerance: Patient limited by fatigue;Other (comment) ?Patient left: in bed;with call bell/phone within reach;with bed alarm set ?Nurse Communication: Mobility status;Precautions ?PT Visit Diagnosis: Muscle weakness (generalized) (M62.81);History of falling (Z91.81);Other abnormalities of gait and mobility (R26.89) ?  ? ? ?Time: 3532-9924 ?PT Time Calculation (min) (ACUTE ONLY): 28 min ? ?Charges:  $Gait Training: 8-22 mins ?$Therapeutic Exercise: 8-22 mins          ?          ? ?Kreg Shropshire, DPT ?10/17/2021, 11:41 AM ? ?

## 2021-10-17 NOTE — Progress Notes (Signed)
Hemodialysis Post Treatment Note: ?  ?Treatment date: 10/17/2021 ?  ?Treatment time:  3 hours  ?  ?Access:  Right AVF ?  ?UF Removed: 500 ml ?  ?Next Treatment: 10/19/21 ?  ?Patient started dialysis as ordered. Difficulty cannulating venous line at start of treatment. Patient tolerated treatment well. V/S stable. Report given to floor nurse Carolyne Fiscal, RN. ?

## 2021-10-18 ENCOUNTER — Inpatient Hospital Stay: Payer: Medicare Other

## 2021-10-18 DIAGNOSIS — Z992 Dependence on renal dialysis: Secondary | ICD-10-CM | POA: Diagnosis not present

## 2021-10-18 DIAGNOSIS — I5022 Chronic systolic (congestive) heart failure: Secondary | ICD-10-CM | POA: Diagnosis not present

## 2021-10-18 DIAGNOSIS — N186 End stage renal disease: Secondary | ICD-10-CM | POA: Diagnosis not present

## 2021-10-18 DIAGNOSIS — R222 Localized swelling, mass and lump, trunk: Secondary | ICD-10-CM | POA: Diagnosis not present

## 2021-10-18 DIAGNOSIS — G7001 Myasthenia gravis with (acute) exacerbation: Secondary | ICD-10-CM | POA: Diagnosis not present

## 2021-10-18 LAB — BASIC METABOLIC PANEL
Anion gap: 7 (ref 5–15)
BUN: 15 mg/dL (ref 8–23)
CO2: 30 mmol/L (ref 22–32)
Calcium: 8.4 mg/dL — ABNORMAL LOW (ref 8.9–10.3)
Chloride: 96 mmol/L — ABNORMAL LOW (ref 98–111)
Creatinine, Ser: 2.65 mg/dL — ABNORMAL HIGH (ref 0.44–1.00)
GFR, Estimated: 19 mL/min — ABNORMAL LOW (ref >60)
Glucose, Bld: 102 mg/dL — ABNORMAL HIGH (ref 70–99)
Potassium: 3.1 mmol/L — ABNORMAL LOW (ref 3.5–5.1)
Sodium: 133 mmol/L — ABNORMAL LOW (ref 135–145)

## 2021-10-18 LAB — CBC
HCT: 23.4 % — ABNORMAL LOW (ref 36.0–46.0)
Hemoglobin: 7.7 g/dL — ABNORMAL LOW (ref 12.0–15.0)
MCH: 31.3 pg (ref 26.0–34.0)
MCHC: 32.9 g/dL (ref 30.0–36.0)
MCV: 95.1 fL (ref 80.0–100.0)
Platelets: 57 10*3/uL — ABNORMAL LOW (ref 150–400)
RBC: 2.46 MIL/uL — ABNORMAL LOW (ref 3.87–5.11)
RDW: 17.6 % — ABNORMAL HIGH (ref 11.5–15.5)
WBC: 5.2 10*3/uL (ref 4.0–10.5)
nRBC: 0 % (ref 0.0–0.2)

## 2021-10-18 LAB — GLUCOSE, CAPILLARY
Glucose-Capillary: 120 mg/dL — ABNORMAL HIGH (ref 70–99)
Glucose-Capillary: 110 mg/dL — ABNORMAL HIGH (ref 70–99)
Glucose-Capillary: 202 mg/dL — ABNORMAL HIGH (ref 70–99)
Glucose-Capillary: 128 mg/dL — ABNORMAL HIGH (ref 70–99)
Glucose-Capillary: 138 mg/dL — ABNORMAL HIGH (ref 70–99)

## 2021-10-18 LAB — MAGNESIUM: Magnesium: 1.7 mg/dL (ref 1.7–2.4)

## 2021-10-18 MED ORDER — IOHEXOL 300 MG/ML  SOLN
80.0000 mL | Freq: Once | INTRAMUSCULAR | Status: AC | PRN
  Administered 2021-10-18: 80 mL via INTRAVENOUS

## 2021-10-18 MED ORDER — IOHEXOL 9 MG/ML PO SOLN
500.0000 mL | ORAL | Status: AC
  Administered 2021-10-18 (×2): 500 mL via ORAL

## 2021-10-18 NOTE — Progress Notes (Addendum)
? ? ? ?Progress Note  ? ? ?Tammie Klein  KXF:[id number] DOB: Nov 10, 1950  DOA: 10/11/2021 ?PCP: Pcp, No  ? ? ? ? ?Brief Narrative:  ? ? ?Medical records reviewed and are as summarized below: ? ?Tammie Klein is a 71 y.o. 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  She recently moved from [place] to [place] to live with her son and daughter-in-law.  She presented to the hospital with shortness of breath and lower extremity weakness. ? ? ? ? ? ?Assessment/Plan:  ? ?Principal Problem: ?  Myasthenia gravis with (acute) exacerbation ([place]) ?Active Problems: ?  Major depressive disorder, recurrent episode, moderate ([place]) ?  End-stage renal disease on hemodialysis ([place]) ?  Dyslipidemia ?  Essential hypertension ?  Chronic systolic CHF (congestive heart failure) ([place]) ?  Pain of right upper extremity ?  Malnutrition of moderate degree ? ? ?Nutrition Problem: Moderate Malnutrition ?Etiology: chronic illness (ESRD on HD) ? ?Signs/Symptoms: mild fat depletion, moderate fat depletion, mild muscle depletion, moderate muscle depletion ? ? ?Body mass index is 19.76 kg/m?. ? ? ?Myasthenia gravis exacerbation with lower extremity weakness and dysphagia, history of thymectomy: She has been evaluated by the neurologist.  Because of improvement in her symptoms, neurologist has decided against initiating IV immunoglobulin.  Continue prednisone.  Plan discussed with Dr. Quinn Axe, neurologist. ? ?Dysphagia to liquids: Consult speech therapist for swallow evaluation. ? ?ESRD on HD, complication of dialysis access: S/p stent placement to right brachiocephalic AV access.  Follow-up with nephrologist for hemodialysis. ? ?Hypokalemia: Defer to nephrologist for management ? ?Acute on chronic anemia: S/p transfusion with 1 unit of PRBCs on  10/14/2021.  H&H is stable ? ?Type II DM, recent hypoglycemia: NovoLog as needed for hyperglycemia ? ?History of recurrent lower extremity DVTs: Patient was restarted on Eliquis on 10/17/2021.  She prefers to try Eliquis again so Coumadin will be discontinued at discharge. ? ?Chronic systolic CHF with EF of 69%, CAD s/p PCI: Continue Lasix, Entresto and BiDil.  She is not on beta-blockers because of myasthenia gravis. ? ?Debility: PT and OT recommended discharge to SNF.  Follow-up with social worker to assist with disposition. ? ?Other comorbidities include hyperlipidemia, osteoarthritis ? ? ?Diet Order   ? ?       ?  Diet regular Room service appropriate? Yes; Fluid consistency: Thin  Diet effective now       ?  ? ?  ?  ? ?  ? ? ? ? ? ? ?Consultants: ?Nephrologist, neurologist, vascular surgeon ? ?Procedures: ?Percutaneous transluminal angioplasty and stent placement of right brachiocephalic AV access on 6/78/9381 ? ? ? ?Medications:  ? ? (feeding supplement) PROSource Plus  30 mL Oral TID BM  ? apixaban  5 mg Oral BID  ? Chlorhexidine Gluconate Cloth  6 each Topical Q0600  ? escitalopram  5 mg Oral Daily  ? famotidine  20 mg Oral Daily  ? furosemide  80 mg Oral BID  ? insulin aspart  0-5 Units Subcutaneous QHS  ? insulin aspart  0-9 Units Subcutaneous TID WC  ? isosorbide-hydrALAZINE  1 tablet Oral BID  ? latanoprost  1 drop Both Eyes QHS  ? melatonin  2.5 mg Oral QHS  ? mirtazapine  7.5 mg Oral QHS  ? multivitamin with minerals  1 tablet Oral Daily  ? pentoxifylline  400 mg Oral Daily  ? predniSONE  5 mg Oral Q breakfast  ? sacubitril-valsartan  1 tablet Oral BID  ? ?Continuous Infusions: ? ? ?Anti-infectives (From admission, onward)  ? ? Start     Dose/Rate Route Frequency Ordered Stop  ? 10/16/21 1039  ceFAZolin (ANCEF) IVPB 1 g/50 mL premix       ? 1 g ?100 mL/hr over 30 Minutes Intravenous 30 min pre-op 10/16/21 1039 10/16/21 1244  ? ?  ? ? ? ? ? ? ? ? ? ?Family Communication/Anticipated D/C date and plan/Code  Status  ? ?DVT prophylaxis:  ?apixaban (ELIQUIS) tablet 5 mg  ? ?  Code Status: Full Code ? ?Family Communication: None ?Disposition Plan: Plan to discharge to SNF ? ?Status is: Inpatient ?Remains inpatient appropriate because: Awaiting placement to SNF ? ? ? ? ? ?Subjective:  ? ?Interval events noted.  No new complaints.  She is feeling a little better today.  She still has some difficulty swallowing liquids. ? ?Objective:  ? ? ?Vitals:  ? 10/18/21 0335 10/18/21 0335 10/18/21 0832 10/18/21 1220  ?BP:  (!) 155/65 (!) 130/58 (!) 109/50  ?Pulse:  99 93 90  ?Resp:  18 16 17   ?Temp: 98.3 ?F (36.8 ?C)  98.2 ?F (36.8 ?C) 98.1 ?F (36.7 ?C)  ?TempSrc: Oral  Oral Oral  ?SpO2:  100% 100% 99%  ?Weight:      ?Height:      ? ?No data found. ? ? ?Intake/Output Summary (Last 24 hours) at 10/18/2021 1452 ?Last data filed at 10/17/2021 1842 ?Gross per 24 hour  ?Intake --  ?Output 500 ml  ?Net -500 ml  ? ?Filed Weights  ? 10/17/21 1505 10/17/21 1900 10/18/21 0330  ?Weight: 49.7 kg 49.5 kg 50.6 kg  ? ? ?Exam: ? ?GEN: NAD ?SKIN: No rash. Lower sternal mass ?EYES: EOMI ?ENT: MMM ?CV: RRR ?PULM: CTA B ?ABD: soft, ND, NT, +BS ?CNS: AAO x 3, non focal ?EXT: No edema or tenderness ? ? ? ? ?  ? ? ?Data Reviewed:  ? ?I have personally reviewed following labs and imaging studies: ? ?Labs: ?Labs show the following:  ? ?Basic Metabolic Panel: ?Recent Labs  ?Lab 10/12/21 ?1140 10/14/21 ?0426 10/15/21 ?0912 10/16/21 ?0352 10/17/21 ?7035 10/18/21 ?0093  ?NA 140 138 140 137 133* 133*  ?K 4.7 3.7 3.9 3.7 3.3* 3.1*  ?CL 102 102 100 100 98 96*  ?CO2 21* 29 27 24 28 30   ?GLUCOSE 102* 101* 91 67* 103* 102*  ?BUN 50* 40* 49* 58* 26* 15  ?CREATININE 5.28* 4.79* 6.17* 7.16* 4.13* 2.65*  ?CALCIUM 8.0* 7.9* 8.8* 8.4* 8.0* 8.4*  ?MG  --   --  2.0 2.1 1.7 1.7  ?PHOS 4.6 4.5  --   --   --   --   ? ?GFR ?Estimated Creatinine Clearance: 16.2 mL/min (A) (by C-G formula based on SCr of 2.65 mg/dL (H)). ?Liver Function Tests: ?Recent Labs  ?Lab 10/12/21 ?1140  10/14/21 ?0426  ?ALBUMIN 3.2* 2.7*  ? ?No results for input(s): LIPASE, AMYLASE in the last 168 hours. ?No results for input(s): AMMONIA in the last 168 hours. ?Coagulation profile ?Recent Labs  ?Lab 10/12/21 ?1230 10/13/21 ?0438 10/14/21 ?0426 10/15/21 ?0501 10/15/21 ?0912  ?INR 2.1* 2.1* 1.8* 1.6* 1.5*  ? ? ?CBC: ?Recent Labs  ?Lab 10/14/21 ?0426 10/15/21 ?0912 10/16/21 ?0352 10/17/21 ?8182 10/18/21 ?9937  ?WBC 4.5 4.6 5.6 4.7 5.2  ?HGB 6.2* 8.2* 7.8* 7.6* 7.7*  ?HCT 19.6* 25.1* 23.6* 23.4* 23.4*  ?MCV 99.0 94.0 94.4 95.1 95.1  ?  PLT 70* 67* 58* 58* 57*  ? ?Cardiac Enzymes: ?Recent Labs  ?Lab 10/11/21 ?2034  ?CKTOTAL 93  ? ?BNP (last 3 results) ?No results for input(s): PROBNP in the last 8760 hours. ?CBG: ?Recent Labs  ?Lab 10/17/21 ?1150 10/17/21 ?2121 10/18/21 ?7353 10/18/21 ?2992 10/18/21 ?1219  ?GLUCAP 154* 155* 120* 110* 202*  ? ?D-Dimer: ?No results for input(s): DDIMER in the last 72 hours. ?Hgb A1c: ?No results for input(s): HGBA1C in the last 72 hours. ?Lipid Profile: ?No results for input(s): CHOL, HDL, LDLCALC, TRIG, CHOLHDL, LDLDIRECT in the last 72 hours. ?Thyroid function studies: ?No results for input(s): TSH, T4TOTAL, T3FREE, THYROIDAB in the last 72 hours. ? ?Invalid input(s): FREET3 ?Anemia work up: ?No results for input(s): VITAMINB12, FOLATE, FERRITIN, TIBC, IRON, RETICCTPCT in the last 72 hours. ?Sepsis Labs: ?Recent Labs  ?Lab 10/15/21 ?0912 10/16/21 ?0352 10/17/21 ?4268 10/18/21 ?3419  ?WBC 4.6 5.6 4.7 5.2  ? ? ?Microbiology ?Recent Results (from the past 240 hour(s))  ?Resp Panel by RT-PCR (Flu A&B, Covid) Nasopharyngeal Swab     Status: None  ? Collection Time: 10/11/21  8:34 PM  ? Specimen: Nasopharyngeal Swab; Nasopharyngeal(NP) swabs in vial transport medium  ?Result Value Ref Range Status  ? SARS Coronavirus 2 by RT PCR NEGATIVE NEGATIVE Final  ?  Comment: (NOTE) ?SARS-CoV-2 target nucleic acids are NOT DETECTED. ? ?The SARS-CoV-2 RNA is generally detectable in upper  respiratory ?specimens during the acute phase of infection. The lowest ?concentration of SARS-CoV-2 viral copies this assay can detect is ?138 copies/mL. A negative result does not preclude SARS-Cov-2 ?infection and should not be used

## 2021-10-18 NOTE — Consult Note (Signed)
? ?                                                                                ?Consultation Note ?Date: 10/18/2021  ? ?Patient Name: Zeta Bucy  ?DOB: [birth year]  MRN: [id number]  Age / Sex: 71 y.o., female  ?PCP: Pcp, No ?Referring Physician: Jennye Boroughs, MD ? ?Reason for Consultation: Establishing goals of care ? ?HPI/Patient Profile: Alyse Kathan is a 70 y.o. 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  She recently moved from [place] to [place] to live with her son and daughter-in-law.  She presented to the hospital with shortness of breath and lower extremity weakness. ? ?Clinical Assessment and Goals of Care: ?Notes and labs reviewed. Patient noticed a lump and is drinking contrast for a CT C/A/P. ? ?We discussed her diagnoses, prognosis, GOC, EOL wishes disposition and options. ? ?Created space and opportunity for patient  to explore thoughts and feelings regarding current medical information. She states she moved here to be near her son and his wife. She states she has been doing dialysis for the past "4 year and 4 months". She states since she started, it has been "a merry go round" and "one thing after another". She states she hates dialysis and feels so bad all the time. She states "I never have a good day." And "I'm tired." She tells me she is not hungry and does not want to eat.  ? ?A detailed discussion was had today regarding advanced directives.  Concepts specific to code status, artifical feeding and hydration, IV antibiotics and rehospitalization were discussed.  The difference between an aggressive medical intervention path and a comfort care path was discussed.  Values and goals of care important to patient and family were attempted to be elicited. ? ?Discussed limitations of medical  interventions to prolong quality of life in some situations and discussed the concept of human mortality. ? ?Discussed in detail what stopping dialysis would look like, and discussed hospice care. She states she has a lot to think about and needs to talk to her son, and will do so herself.  She states she would never want CPR or resuscitation. She states she would never want to be placed on a ventilator. She would like to be changed to DNR/DNI. Advised I would f/u Monday and if she decided on hospice facility or hospice at home prior to discharge to let the team know. Discussed having palliative to follow outpatient if she goes to rehab.  ? ?I completed a MOST form today and the signed original was placed in the chart. A photocopy was placed in the chart to be scanned into EMR. The patient outlined their wishes for the following treatment decisions: ? ?Cardiopulmonary Resuscitation: Do Not Attempt Resuscitation (DNR/No CPR)  ?Medical Interventions: Limited Additional Interventions: Use medical treatment, IV fluids and cardiac monitoring as indicated, DO NOT USE intubation or mechanical ventilation. May consider use of less invasive airway support such as BiPAP or CPAP. Also provide comfort measures. Transfer to the hospital if indicated. Avoid intensive  care.   ?Antibiotics: Left Blank  ?IV Fluids: Left Blank  ?Feeding Tube: Left Blank  ?  ? ?SUMMARY OF RECOMMENDATIONS   ?Considering stopping dialysis and shifting to hospice care.  ? ?Advised I would f/u Monday and if she decided on hospice facility or hospice at home prior to discharge to let the team know. Discussed having palliative to follow outpatient if she goes to rehab.  ? ? ? ?  ? ?Primary Diagnoses: ?Present on Admission: ? Myasthenia gravis with (acute) exacerbation ([place]) ? Major depressive disorder, recurrent episode, moderate ([place]) ? ? ?I have reviewed the medical record, interviewed the patient and family, and examined the patient. The following  aspects are pertinent. ? ?Past Medical History:  ?Diagnosis Date  ? Blood transfusion without reported diagnosis   ? CHF (congestive heart failure) ([place])   ? Diabetes mellitus without complication ([place])   ? Hypertension   ? Renal disorder   ? ?Social History  ? ?Socioeconomic History  ? Marital status: Single  ?  Spouse name: Not on file  ? Number of children: Not on file  ? Years of education: Not on file  ? Highest education level: Not on file  ?Occupational History  ? Not on file  ?Tobacco Use  ? Smoking status: Never  ? Smokeless tobacco: Never  ?Vaping Use  ? Vaping Use: Never used  ?Substance and Sexual Activity  ? Alcohol use: Never  ? Drug use: Never  ? Sexual activity: Not on file  ?Other Topics Concern  ? Not on file  ?Social History Narrative  ? Not on file  ? ?Social Determinants of Health  ? ?Financial Resource Strain: Not on file  ?Food Insecurity: Not on file  ?Transportation Needs: Not on file  ?Physical Activity: Not on file  ?Stress: Not on file  ?Social Connections: Not on file  ? ?No family history on file. ?Scheduled Meds: ? (feeding supplement) PROSource Plus  30 mL Oral TID BM  ? apixaban  5 mg Oral BID  ? Chlorhexidine Gluconate Cloth  6 each Topical Q0600  ? escitalopram  5 mg Oral Daily  ? famotidine  20 mg Oral Daily  ? furosemide  80 mg Oral BID  ? insulin aspart  0-5 Units Subcutaneous QHS  ? insulin aspart  0-9 Units Subcutaneous TID WC  ? isosorbide-hydrALAZINE  1 tablet Oral BID  ? latanoprost  1 drop Both Eyes QHS  ? melatonin  2.5 mg Oral QHS  ? mirtazapine  7.5 mg Oral QHS  ? multivitamin with minerals  1 tablet Oral Daily  ? pentoxifylline  400 mg Oral Daily  ? predniSONE  5 mg Oral Q breakfast  ? sacubitril-valsartan  1 tablet Oral BID  ? ?Continuous Infusions: ?PRN Meds:.acetaminophen **OR** acetaminophen, HYDROmorphone (DILAUDID) injection, hydrOXYzine, iohexol, loperamide, nitroGLYCERIN, ondansetron **OR** ondansetron (ZOFRAN) IV, ondansetron (ZOFRAN) IV ?Medications Prior to  Admission:  ?Prior to Admission medications   ?Medication Sig Start Date End Date Taking? Authorizing Provider  ?atorvastatin (LIPITOR) 20 MG tablet Take 20 mg by mouth daily. 03/02/21  Yes [provider]  ?bimatoprost (LUMIGAN) 0.01 % SOLN Place 1 drop into both eyes at bedtime.   Yes [provider]  ?carvedilol (COREG) 25 MG tablet Take 25 mg by mouth 2 (two) times daily with a meal. 10/21/19  Yes [provider]  ?famotidine (PEPCID) 20 MG tablet Take 20 mg by mouth daily. 09/17/18  Yes [provider]  ?furosemide (LASIX) 80 MG tablet Take 80 mg  by mouth 2 (two) times daily. 09/27/20  Yes [provider]  ?insulin degludec (TRESIBA) 200 UNIT/ML FlexTouch Pen Inject 4-6 Units into the skin in the morning. 09/28/18  Yes [provider]  ?isosorbide-hydrALAZINE (BIDIL) 20-37.5 MG tablet Take 1 tablet by mouth 2 (two) times daily. 04/23/21  Yes [provider]  ?lidocaine (LIDODERM) 5 % Place 1 patch onto the skin daily. 05/24/21  Yes [provider]  ?megestrol (MEGACE ES) 625 MG/5ML suspension Take 625 mg by mouth daily. 08/31/21  Yes [provider]  ?melatonin 3 MG TABS tablet Take 3 mg by mouth at bedtime. 09/23/21  Yes [provider]  ?mirtazapine (REMERON) 7.5 MG tablet Take 7.5 mg by mouth at bedtime. 09/23/21  Yes [provider]  ?nitroGLYCERIN (NITROSTAT) 0.4 MG SL tablet Place under the tongue. 03/27/21  Yes [provider]  ?ondansetron (ZOFRAN) 4 MG tablet Take 8 mg by mouth every 8 (eight) hours as needed. 04/16/21 04/16/22 Yes [provider]  ?pentoxifylline (TRENTAL) 400 MG CR tablet Take 400 mg by mouth daily. 12/10/19  Yes [provider]  ?predniSONE (DELTASONE) 5 MG tablet Take 5 mg by mouth 3 (three) times a week. On HD Days, Mon-Wed-Fri 08/09/21  Yes [provider]  ?sacubitril-valsartan (ENTRESTO) 24-26 MG Take 1 tablet by mouth 2 (two) times daily. 05/22/21  Yes  [provider]  ?senna-docusate (SENOKOT-S) 8.6-50 MG tablet Take 1 tablet by mouth daily. 03/13/21  Yes [provider]  ?warfarin (COUMADIN) 1 MG tablet Take 1 mg by mouth daily at 4 PM. 1/31/2

## 2021-10-18 NOTE — Progress Notes (Signed)
?[facility]  ?ROUNDING NOTE  ? ?Subjective:  ? ?Tammie Klein is a 71 year old female with past medical histories including chronic heart failure, diabetes, hypertension, myasthenia gravis, and end-stage renal disease on hemodialysis.  Patient presents to the emergency department with shortness of breath and lower extremity weakness.  Patient has been admitted for Myasthenia gravis with acute exacerbation ([place]) [G70.01] ?Myasthenia gravis with (acute) exacerbation ([place]) [G70.01] ? ?Patient was recently moved to the area from [place] to live with her brother.  Patient recently started receiving outpatient dialysis treatments at [facility], on a Monday Wednesday Friday schedule.  ? ?Update ?Patient seen sitting bedside  ?Tolerating small meals ?Ambulating in hallway with therapy ? ? ?Objective:  ?Vital signs in last 24 hours:  ?Temp:  [98.1 ?F (36.7 ?C)-99.3 ?F (37.4 ?C)] 98.1 ?F (36.7 ?C) (04/27 1220) ?Pulse Rate:  [85-99] 90 (04/27 1220) ?Resp:  [16-27] 17 (04/27 1220) ?BP: (106-155)/(46-110) 109/50 (04/27 1220) ?SpO2:  [99 %-100 %] 99 % (04/27 1220) ?Weight:  [49.5 kg-50.6 kg] 50.6 kg (04/27 0330) ? ?Weight change: -1.5 kg ?Filed Weights  ? 10/17/21 1505 10/17/21 1900 10/18/21 0330  ?Weight: 49.7 kg 49.5 kg 50.6 kg  ? ? ?Intake/Output: ?I/O last 3 completed shifts: ?In: 716 [P.O.:716] ?Out: 500 [Other:500] ?  ?Intake/Output this shift: ? No intake/output data recorded. ? ?Physical Exam: ?General: NAD  ?Head: Normocephalic, atraumatic. Moist oral mucosal membranes  ?Eyes: Anicteric  ?Lungs:  Clear to auscultation, normal effort, room air  ?Heart: Regular rate and rhythm  ?Abdomen:  Soft, nontender, nondistended  ?Extremities: No peripheral edema.  ?Neurologic: Nonfocal, moving all four extremities  ?Skin: No lesions  ?Access: Left aVF  ? ? ?Basic Metabolic Panel: ?Recent Labs  ?Lab 10/12/21 ?1140 10/14/21 ?0426 10/15/21 ?0912 10/16/21 ?0352 10/17/21 ?2683 10/18/21 ?4196  ?NA 140 138 140 137  133* 133*  ?K 4.7 3.7 3.9 3.7 3.3* 3.1*  ?CL 102 102 100 100 98 96*  ?CO2 21* 29 27 24 28 30   ?GLUCOSE 102* 101* 91 67* 103* 102*  ?BUN 50* 40* 49* 58* 26* 15  ?CREATININE 5.28* 4.79* 6.17* 7.16* 4.13* 2.65*  ?CALCIUM 8.0* 7.9* 8.8* 8.4* 8.0* 8.4*  ?MG  --   --  2.0 2.1 1.7 1.7  ?PHOS 4.6 4.5  --   --   --   --   ? ? ? ?Liver Function Tests: ?Recent Labs  ?Lab 10/12/21 ?1140 10/14/21 ?0426  ?ALBUMIN 3.2* 2.7*  ? ? ?No results for input(s): LIPASE, AMYLASE in the last 168 hours. ?No results for input(s): AMMONIA in the last 168 hours. ? ?CBC: ?Recent Labs  ?Lab 10/14/21 ?0426 10/15/21 ?0912 10/16/21 ?0352 10/17/21 ?2229 10/18/21 ?7989  ?WBC 4.5 4.6 5.6 4.7 5.2  ?HGB 6.2* 8.2* 7.8* 7.6* 7.7*  ?HCT 19.6* 25.1* 23.6* 23.4* 23.4*  ?MCV 99.0 94.0 94.4 95.1 95.1  ?PLT 70* 67* 58* 58* 57*  ? ? ? ?Cardiac Enzymes: ?Recent Labs  ?Lab 10/11/21 ?2034  ?CKTOTAL 93  ? ? ? ?BNP: ?Invalid input(s): POCBNP ? ?CBG: ?Recent Labs  ?Lab 10/17/21 ?1150 10/17/21 ?2121 10/18/21 ?2119 10/18/21 ?4174 10/18/21 ?1219  ?GLUCAP 154* 155* 120* 110* 202*  ? ? ? ?Microbiology: ?Results for orders placed or performed during the hospital encounter of 10/11/21  ?Resp Panel by RT-PCR (Flu A&B, Covid) Nasopharyngeal Swab     Status: None  ? Collection Time: 10/11/21  8:34 PM  ? Specimen: Nasopharyngeal Swab; Nasopharyngeal(NP) swabs in vial transport medium  ?Result Value Ref Range Status  ?  SARS Coronavirus 2 by RT PCR NEGATIVE NEGATIVE Final  ?  Comment: (NOTE) ?SARS-CoV-2 target nucleic acids are NOT DETECTED. ? ?The SARS-CoV-2 RNA is generally detectable in upper respiratory ?specimens during the acute phase of infection. The lowest ?concentration of SARS-CoV-2 viral copies this assay can detect is ?138 copies/mL. A negative result does not preclude SARS-Cov-2 ?infection and should not be used as the sole basis for treatment or ?other patient management decisions. A negative result may occur with  ?improper specimen collection/handling, submission  of specimen other ?than nasopharyngeal swab, presence of viral mutation(s) within the ?areas targeted by this assay, and inadequate number of viral ?copies(<138 copies/mL). A negative result must be combined with ?clinical observations, patient history, and epidemiological ?information. The expected result is Negative. ? ?Fact Sheet for Patients:  ?EntrepreneurPulse.com.au ? ?Fact Sheet for Healthcare Providers:  ?IncredibleEmployment.be ? ?This test is no t yet approved or cleared by the [place] FDA and  ?has been authorized for detection and/or diagnosis of SARS-CoV-2 by ?FDA under an Emergency Use Authorization (EUA). This EUA will remain  ?in effect (meaning this test can be used) for the duration of the ?COVID-19 declaration under Section 564(b)(1) of the Act, 21 ?U.S.C.section 360bbb-3(b)(1), unless the authorization is terminated  ?or revoked sooner.  ? ? ?  ? Influenza A by PCR NEGATIVE NEGATIVE Final  ? Influenza B by PCR NEGATIVE NEGATIVE Final  ?  Comment: (NOTE) ?The Xpert Xpress SARS-CoV-2/FLU/RSV plus assay is intended as an aid ?in the diagnosis of influenza from Nasopharyngeal swab specimens and ?should not be used as a sole basis for treatment. Nasal washings and ?aspirates are unacceptable for Xpert Xpress SARS-CoV-2/FLU/RSV ?testing. ? ?Fact Sheet for Patients: ?EntrepreneurPulse.com.au ? ?Fact Sheet for Healthcare Providers: ?IncredibleEmployment.be ? ?This test is not yet approved or cleared by the [place] FDA and ?has been authorized for detection and/or diagnosis of SARS-CoV-2 by ?FDA under an Emergency Use Authorization (EUA). This EUA will remain ?in effect (meaning this test can be used) for the duration of the ?COVID-19 declaration under Section 564(b)(1) of the Act, 21 U.S.C. ?section 360bbb-3(b)(1), unless the authorization is terminated or ?revoked. ? ?Performed at [facility], [place], ?[place] [address] ?  ? ? ?Coagulation Studies: ?No results for input(s): LABPROT, INR in the last 72 hours. ? ? ?Urinalysis: ?No results for input(s): COLORURINE, LABSPEC, [place], GLUCOSEU, HGBUR, BILIRUBINUR, KETONESUR, PROTEINUR, UROBILINOGEN, NITRITE, LEUKOCYTESUR in the last 72 hours. ? ?Invalid input(s): APPERANCEUR  ? ? ?Imaging: ?No results found. ? ? ?Medications:  ? ? ? ? ? (feeding supplement) PROSource Plus  30 mL Oral TID BM  ? apixaban  5 mg Oral BID  ? Chlorhexidine Gluconate Cloth  6 each Topical Q0600  ? escitalopram  5 mg Oral Daily  ? famotidine  20 mg Oral Daily  ? furosemide  80 mg Oral BID  ? insulin aspart  0-5 Units Subcutaneous QHS  ? insulin aspart  0-9 Units Subcutaneous TID WC  ? isosorbide-hydrALAZINE  1 tablet Oral BID  ? latanoprost  1 drop Both Eyes QHS  ? melatonin  2.5 mg Oral QHS  ? mirtazapine  7.5 mg Oral QHS  ? multivitamin with minerals  1 tablet Oral Daily  ? pentoxifylline  400 mg Oral Daily  ? predniSONE  5 mg Oral Q breakfast  ? sacubitril-valsartan  1 tablet Oral BID  ? ?acetaminophen **OR** acetaminophen, HYDROmorphone (DILAUDID) injection, hydrOXYzine, loperamide, nitroGLYCERIN, ondansetron **OR** ondansetron (ZOFRAN) IV, ondansetron (ZOFRAN) IV ? ?  Assessment/ Plan:  ?Ms. Nathalya Wolanski is a 71 y.o.  female with past medical histories including chronic heart failure, diabetes, hypertension, myasthenia gravis, and end-stage renal disease on hemodialysis.  Patient presents to the emergency department with shortness of breath and lower extremity weakness.  Patient has been admitted for Myasthenia gravis with acute exacerbation ([place]) [G70.01] ?Myasthenia gravis with (acute) exacerbation ([place]) [G70.01] ? ?[facility]/MWF/left aVF ? ?End-stage renal disease on hemodialysis.  Will maintain outpatient schedule, if possible. Received dialysis yesterday, UF 561ml achieved. Next treatment scheduled for Friday.  ? ? ?2. Anemia of chronic kidney disease ?Lab Results   ?Component Value Date  ? HGB 7.7 (L) 10/18/2021  ?Mircera received outpatient biweekly.   ?Low dose EPO with dialysis ? ?3. Secondary Hyperparathyroidism:  ?Lab Results  ?Component Value Date  ? CALCI

## 2021-10-18 NOTE — Progress Notes (Signed)
NIF -18,FVC 0.98 ?

## 2021-10-18 NOTE — Progress Notes (Signed)
Clinical/Bedside Swallow Evaluation ?Patient Details  ?Name: Tammie Klein ?MRN: [id number] ?Date of Birth: 1950-07-28 ? ?Today's Date: 10/18/2021 ?Time: SLP Start Time (ACUTE ONLY): 1320 SLP Stop Time (ACUTE ONLY): 3235 ?SLP Time Calculation (min) (ACUTE ONLY): 35 min ? ?Past Medical History:  ?Past Medical History:  ?Diagnosis Date  ? Blood transfusion without reported diagnosis   ? CHF (congestive heart failure) ([place])   ? Diabetes mellitus without complication ([place])   ? Hypertension   ? Renal disorder   ? ?Past Surgical History:  ?Past Surgical History:  ?Procedure Laterality Date  ? A/V FISTULAGRAM Right 10/16/2021  ? Procedure: A/V Fistulagram;  Surgeon: Katha Cabal, MD;  Location: [facility];  Service: Cardiovascular;  Laterality: Right;  ? ABDOMINAL HYSTERECTOMY    ? CARDIAC CATHETERIZATION  2023  ? Thymus Gland Removal    ? ?HPI:  ?Patient is a 71 year old female with past medical history including myasthenia gravis, ESRD on dialysis, depression, CHF, DM, HTN, malnutrition admitted for SOB and lower extremity weakness after dialysis on 10/11/21. CXR 10/11/21: "no radiographic evidence of acute cardiopulmonary disease. Mild cardiomegaly."  ?  ?Assessment / Plan / Recommendation  ?Clinical Impression ?  Patient presents with appearance of functional oropharyngeal swallowing with adequate airway protection with thin liquids. Patient declined additional textures due to attempting to drink several ounces of contrast for CT. Pt reports history of difficulty swallowing liquids vs solids. States, "it gets stuck right here," gesturing to thyroid notch. Pt did not exhibit any overt signs of aspiration. Complaints are consistent with primary esophageal dysphagia (pt reports heartburn, globus sensation, occasional regurgitation with liquids). Pt reports unable to drink large quantities of liquid. With repositioning (more upright) and encouragement, pt consumed approximately 20 oz of thin liquid. She does  grimace and belch occasionally. Recommend she continue regular diet with thin liquids. Education provided to pt regarding esophageal precautions including full upright position or out of bed for meals, remaining upright after POs for at least 30 minutes, slow rate, and smaller, more frequent meals. Recommend follow-up with GI as outpatient. No further ST needs identified. SLP to s/o.   ? ? ? ? ?SLP Visit Diagnosis: Dysphagia, unspecified (R13.10) ?   ?Aspiration Risk ? Mild aspiration risk (due to suspected esophageal vs oropharyngeal dysphagia)  ?  ?Diet Recommendation Regular;Thin liquid  ? ?Liquid Administration via: Cup ?Medication Administration: Whole meds with liquid ?Supervision: Patient able to self feed ?Compensations: Small sips/bites;Slow rate ?Postural Changes: Seated upright at 90 degrees;Remain upright for at least 30 minutes after po intake  ?  ?Other  Recommendations Recommended Consults: Consider GI evaluation;Consider esophageal assessment ?Oral Care Recommendations: Oral care BID   ? ?Recommendations for follow up therapy are one component of a multi-disciplinary discharge planning process, led by the attending physician.  Recommendations may be updated based on patient status, additional functional criteria and insurance authorization. ? ?Follow up Recommendations No SLP follow up  ? ? ?  ?Assistance Recommended at Discharge    ?Functional Status Assessment    ?Frequency and Duration    ?  ?  ?   ? ?Prognosis Prognosis for Safe Diet Advancement: Good  ? ?  ? ?Swallow Study   ?General Date of Onset: 10/11/21 ?HPI: Patient is a 71 year old female with past medical history including myasthenia gravis, ESRD on dialysis, depression, CHF, DM, HTN, malnutrition admitted for SOB and lower extremity weakness after dialysis on 10/11/21. CXR 10/11/21: "no radiographic evidence of acute cardiopulmonary disease. Mild cardiomegaly." ?  Type of Study: Bedside Swallow Evaluation ?Previous Swallow Assessment: none  on file ?Diet Prior to this Study: Regular;Thin liquids ?Temperature Spikes Noted: No ?Respiratory Status: Room air ?History of Recent Intubation: No ?Behavior/Cognition: Alert;Cooperative ?Oral Cavity Assessment: Within Functional Limits ?Oral Care Completed by SLP: No ?Oral Cavity - Dentition: Adequate natural dentition ?Vision: Functional for self-feeding ?Self-Feeding Abilities: Able to feed self ?Patient Positioning: Upright in bed ?Baseline Vocal Quality: Normal ?Volitional Cough: Strong ?Volitional Swallow: Able to elicit  ?  ?Oral/Motor/Sensory Function Overall Oral Motor/Sensory Function: Within functional limits   ?Ice Chips Ice chips: Not tested   ?Thin Liquid Thin Liquid: Within functional limits ?Presentation: Cup;Self Fed  ?  ?Nectar Thick Nectar Thick Liquid: Not tested   ?Honey Thick Honey Thick Liquid: Not tested   ?Puree Puree: Not tested   ?Solid ? ? ? [name], MS, CCC-SLP ?Speech-Language Pathologist ?[phone] ? Solid: Not tested  ? ?  ? ?Aliene Altes ?10/18/2021,3:30 PM ? ? ? ? ?

## 2021-10-18 NOTE — Progress Notes (Addendum)
Hemodialysis patient known at [facility] MWF 11:45am.  ?

## 2021-10-18 NOTE — TOC Progression Note (Signed)
Transition of Care (TOC) - Progression Note  ? ? ?Patient Details  ?Name: Tammie Klein ?MRN: [id number] ?Date of Birth: Jan 10, 1951 ? ?Transition of Care (TOC) CM/SW Contact  ?Alberteen Sam, LCSW ?Phone Number: ?10/18/2021, 2:16 PM ? ?Clinical Narrative:    ? ?CSW followed up with patient on facility decision, she reports she's waiting to talk with her son and his wife, requests rest of the day to review the 3 facilities and reports she will have a decision first thing in the morning.  ? ? ?Expected Discharge Plan: [place] ?Barriers to Discharge: Continued Medical Work up ? ?Expected Discharge Plan and Services ?Expected Discharge Plan: [place] ?  ?Discharge Planning Services: CM Consult ?Post Acute Care Choice: [place] ?Living arrangements for the past 2 months: [place] ?                ?DME Arranged: Lightweight manual wheelchair with seat cushion ?DME Agency: [employer] ?Date DME Agency Contacted: 10/12/21 ?Time DME Agency Contacted: [id number] ?Representative spoke with at DME Agency: Suanne Marker ?HH Arranged: RN, PT, OT ?  ?  ?  ?  ? ? ?Social Determinants of Health (SDOH) Interventions ?  ? ?Readmission Risk Interventions ?   ? View : No data to display.  ?  ?  ?  ? ? ?

## 2021-10-18 NOTE — Progress Notes (Signed)
Subjective: ?Patient feeling well, sitting on side of bed. States she has been working hard with PT, and overall her weakness has improved, and is back to recent baseline. She notes a hard nontender 2cm protrusion at the bottom of her sternum in the midline, she just noticed it yesterday. ? ?Objective: ?Current vital signs: ?BP (!) 109/50 (BP Location: Left Arm)   Pulse 90   Temp 98.1 ?F (36.7 ?C) (Oral)   Resp 17   Ht 5\' 3"  (1.6 m)   Wt 50.6 kg   SpO2 99%   BMI 19.76 kg/m?  ?Vital signs in last 24 hours: ?Temp:  [98.1 ?F (36.7 ?C)-99.3 ?F (37.4 ?C)] 98.1 ?F (36.7 ?C) (04/27 1220) ?Pulse Rate:  [85-99] 90 (04/27 1220) ?Resp:  [16-27] 17 (04/27 1220) ?BP: (106-155)/(46-110) 109/50 (04/27 1220) ?SpO2:  [99 %-100 %] 99 % (04/27 1220) ?Weight:  [49.5 kg-50.6 kg] 50.6 kg (04/27 0330) ? ?Intake/Output from previous day: ?04/26 0701 - 04/27 0700 ?In: 716 [P.O.:716] ?Out: 500  ?Intake/Output this shift: ?No intake/output data recorded. ?Nutritional status:  ?Diet Order   ? ?       ?  Diet regular Room service appropriate? Yes; Fluid consistency: Thin  Diet effective now       ?  ? ?  ?  ? ?  ? ?Physical Exam  ?HEENT-  [place]/AT ?Lungs- Respirations unlabored ?Extremities- Edema has resolved ?Chest: hard nontender mass at the bottom of her sternum, about 2cm   ?  ?Neurological Examination ?Mental Status: Alert and oriented, thought content appropriate. Fatigued appearing. Speech fluent without evidence of aphasia.  Able to follow all commands without difficulty. ?Cranial Nerves: ?II: Fixates and tracks normally.   ?III,IV, VI: Mild bilateral ptosis. EOMI with ability to fully bury sclerae on horizontal gaze and no fatiguability of extraocular muscles with upgaze, except for mild transient esotropia when eyes return back to the midline after sustained upgaze.  ?VII: Smile symmetric.  ?VIII: Hearing intact to voice ?IX,X: No hoarseness or hypophonia ?XI: Head is midline ?XII: Midline tongue extension ?Motor: ?BUE: 4/5 BUE  proximally and distally without asymmetry ?BLE 4/5 hip flexion, knee flexion and knee extension bilat. ADF/APF 4+/5 bilaterally .  ?Cerebellar: No ataxia noted  ?Gait: Deferred ? ?Lab Results: ?Results for orders placed or performed during the hospital encounter of 10/11/21 (from the past 48 hour(s))  ?Glucose, capillary     Status: None  ? Collection Time: 10/16/21  6:13 PM  ?Result Value Ref Range  ? Glucose-Capillary 75 70 - 99 mg/dL  ?  Comment: Glucose reference range applies only to samples taken after fasting for at least 8 hours.  ?Glucose, capillary     Status: Abnormal  ? Collection Time: 10/16/21  8:40 PM  ?Result Value Ref Range  ? Glucose-Capillary 130 (H) 70 - 99 mg/dL  ?  Comment: Glucose reference range applies only to samples taken after fasting for at least 8 hours.  ?Glucose, capillary     Status: Abnormal  ? Collection Time: 10/16/21 11:47 PM  ?Result Value Ref Range  ? Glucose-Capillary 118 (H) 70 - 99 mg/dL  ?  Comment: Glucose reference range applies only to samples taken after fasting for at least 8 hours.  ?Basic metabolic panel     Status: Abnormal  ? Collection Time: 10/17/21  5:37 AM  ?Result Value Ref Range  ? Sodium 133 (L) 135 - 145 mmol/L  ? Potassium 3.3 (L) 3.5 - 5.1 mmol/L  ? Chloride 98 98 - 111  mmol/L  ? CO2 28 22 - 32 mmol/L  ? Glucose, Bld 103 (H) 70 - 99 mg/dL  ?  Comment: Glucose reference range applies only to samples taken after fasting for at least 8 hours.  ? BUN 26 (H) 8 - 23 mg/dL  ? Creatinine, Ser 4.13 (H) 0.44 - 1.00 mg/dL  ? Calcium 8.0 (L) 8.9 - 10.3 mg/dL  ? GFR, Estimated 11 (L) >60 mL/min  ?  Comment: (NOTE) ?Calculated using the CKD-EPI Creatinine Equation (2021) ?  ? Anion gap 7 5 - 15  ?  Comment: Performed at [facility], [address], [place], [place] [address]  ?CBC     Status: Abnormal  ? Collection Time: 10/17/21  5:37 AM  ?Result Value Ref Range  ? WBC 4.7 4.0 - 10.5 K/uL  ? RBC 2.46 (L) 3.87 - 5.11 MIL/uL  ? Hemoglobin 7.6 (L) 12.0 - 15.0  g/dL  ? HCT 23.4 (L) 36.0 - 46.0 %  ? MCV 95.1 80.0 - 100.0 fL  ? MCH 30.9 26.0 - 34.0 pg  ? MCHC 32.5 30.0 - 36.0 g/dL  ? RDW 18.0 (H) 11.5 - 15.5 %  ? Platelets 58 (L) 150 - 400 K/uL  ?  Comment: Immature Platelet Fraction may be ?clinically indicated, consider ?ordering this additional test ?[id number] ?REPEATED TO VERIFY ?  ? nRBC 0.0 0.0 - 0.2 %  ?  Comment: Performed at [facility], [address] [place], [place] [address]  ?Magnesium     Status: None  ? Collection Time: 10/17/21  5:37 AM  ?Result Value Ref Range  ? Magnesium 1.7 1.7 - 2.4 mg/dL  ?  Comment: Performed at [facility], [address], [place], [place] [address]  ?Glucose, capillary     Status: Abnormal  ? Collection Time: 10/17/21  8:17 AM  ?Result Value Ref Range  ? Glucose-Capillary 102 (H) 70 - 99 mg/dL  ?  Comment: Glucose reference range applies only to samples taken after fasting for at least 8 hours.  ?Glucose, capillary     Status: Abnormal  ? Collection Time: 10/17/21 11:50 AM  ?Result Value Ref Range  ? Glucose-Capillary 154 (H) 70 - 99 mg/dL  ?  Comment: Glucose reference range applies only to samples taken after fasting for at least 8 hours.  ?Glucose, capillary     Status: Abnormal  ? Collection Time: 10/17/21  9:21 PM  ?Result Value Ref Range  ? Glucose-Capillary 155 (H) 70 - 99 mg/dL  ?  Comment: Glucose reference range applies only to samples taken after fasting for at least 8 hours.  ?Glucose, capillary     Status: Abnormal  ? Collection Time: 10/18/21  3:38 AM  ?Result Value Ref Range  ? Glucose-Capillary 120 (H) 70 - 99 mg/dL  ?  Comment: Glucose reference range applies only to samples taken after fasting for at least 8 hours.  ?Basic metabolic panel     Status: Abnormal  ? Collection Time: 10/18/21  5:35 AM  ?Result Value Ref Range  ? Sodium 133 (L) 135 - 145 mmol/L  ? Potassium 3.1 (L) 3.5 - 5.1 mmol/L  ? Chloride 96 (L) 98 - 111 mmol/L  ? CO2 30 22 - 32 mmol/L  ? Glucose, Bld 102 (H) 70 - 99 mg/dL  ?   Comment: Glucose reference range applies only to samples taken after fasting for at least 8 hours.  ? BUN 15 8 - 23 mg/dL  ? Creatinine, Ser 2.65 (  H) 0.44 - 1.00 mg/dL  ? Calcium 8.4 (L) 8.9 - 10.3 mg/dL  ? GFR, Estimated 19 (L) >60 mL/min  ?  Comment: (NOTE) ?Calculated using the CKD-EPI Creatinine Equation (2021) ?  ? Anion gap 7 5 - 15  ?  Comment: Performed at [facility], [address] [place], [place] [address]  ?CBC     Status: Abnormal  ? Collection Time: 10/18/21  5:35 AM  ?Result Value Ref Range  ? WBC 5.2 4.0 - 10.5 K/uL  ? RBC 2.46 (L) 3.87 - 5.11 MIL/uL  ? Hemoglobin 7.7 (L) 12.0 - 15.0 g/dL  ? HCT 23.4 (L) 36.0 - 46.0 %  ? MCV 95.1 80.0 - 100.0 fL  ? MCH 31.3 26.0 - 34.0 pg  ? MCHC 32.9 30.0 - 36.0 g/dL  ? RDW 17.6 (H) 11.5 - 15.5 %  ? Platelets 57 (L) 150 - 400 K/uL  ?  Comment: Immature Platelet Fraction may be ?clinically indicated, consider ?ordering this additional test ?[id number] ?  ? nRBC 0.0 0.0 - 0.2 %  ?  Comment: Performed at [facility], [address], [place], [place] [address]  ?Magnesium     Status: None  ? Collection Time: 10/18/21  5:35 AM  ?Result Value Ref Range  ? Magnesium 1.7 1.7 - 2.4 mg/dL  ?  Comment: Performed at [facility], [address] [place], [place] [address]  ?Glucose, capillary     Status: Abnormal  ? Collection Time: 10/18/21  8:33 AM  ?Result Value Ref Range  ? Glucose-Capillary 110 (H) 70 - 99 mg/dL  ?  Comment: Glucose reference range applies only to samples taken after fasting for at least 8 hours.  ?Glucose, capillary     Status: Abnormal  ? Collection Time: 10/18/21 12:19 PM  ?Result Value Ref Range  ? Glucose-Capillary 202 (H) 70 - 99 mg/dL  ?  Comment: Glucose reference range applies only to samples taken after fasting for at least 8 hours.  ? ? ?Recent Results (from the past 240 hour(s))  ?Resp Panel by RT-PCR (Flu A&B, Covid) Nasopharyngeal Swab     Status: None  ? Collection Time: 10/11/21  8:34 PM  ? Specimen:  Nasopharyngeal Swab; Nasopharyngeal(NP) swabs in vial transport medium  ?Result Value Ref Range Status  ? SARS Coronavirus 2 by RT PCR NEGATIVE NEGATIVE Final  ?  Comment: (NOTE) ?SARS-CoV-2 target nucleic acids are

## 2021-10-18 NOTE — Progress Notes (Signed)
PT Cancellation Note ? ?Patient Details ?Name: Tammie Klein ?MRN: [id number] ?DOB: 30-Jan-1951 ? ? ?Cancelled Treatment:     Pt had just returned to floor from CT upon author arriving. She reports having long day an is unsure if she is planning to stop HD and go more comfort approach." I've had a really rough day. Can we just hold off on PT?" Pt will continue to follow until pt makes decision about POC going forward.  ? ? ?Willette Pa ?10/18/2021, 4:54 PM ?

## 2021-10-18 NOTE — Progress Notes (Signed)
Pt refused obtaining the NIF and VC tonight.  ?

## 2021-10-18 NOTE — Plan of Care (Signed)

## 2021-10-18 NOTE — TOC Progression Note (Signed)
Transition of Care (TOC) - Progression Note  ? ? ?Patient Details  ?Name: Tammie Klein ?MRN: [id number] ?Date of Birth: 28-Oct-1950 ? ?Transition of Care (TOC) CM/SW Contact  ?Alberteen Sam, LCSW ?Phone Number: ?10/18/2021, 10:55 AM ? ?Clinical Narrative:    ? ?CSW met with patient at bedside to review bed offers of [facility], [facility], or [facility]. Patient reports she will review and would like CSW to come back this evening for her decision.  ? ?Pending bed choice at this time.  ? ? ?Expected Discharge Plan: [place] ?Barriers to Discharge: Continued Medical Work up ? ?Expected Discharge Plan and Services ?Expected Discharge Plan: [place] ?  ?Discharge Planning Services: CM Consult ?Post Acute Care Choice: [facility] ?Living arrangements for the past 2 months: [place] ?                ?DME Arranged: Lightweight manual wheelchair with seat cushion ?DME Agency: [employer] ?Date DME Agency Contacted: 10/12/21 ?Time DME Agency Contacted: [phone] ?Representative spoke with at DME Agency: Suanne Marker ?HH Arranged: RN, PT, OT ?  ?  ?  ?  ? ? ?Social Determinants of Health (SDOH) Interventions ?  ? ?Readmission Risk Interventions ?   ? View : No data to display.  ?  ?  ?  ? ? ?

## 2021-10-19 ENCOUNTER — Other Ambulatory Visit (HOSPITAL_COMMUNITY): Payer: Self-pay

## 2021-10-19 DIAGNOSIS — G7001 Myasthenia gravis with (acute) exacerbation: Secondary | ICD-10-CM | POA: Diagnosis not present

## 2021-10-19 DIAGNOSIS — Z992 Dependence on renal dialysis: Secondary | ICD-10-CM | POA: Diagnosis not present

## 2021-10-19 DIAGNOSIS — I5022 Chronic systolic (congestive) heart failure: Secondary | ICD-10-CM | POA: Diagnosis not present

## 2021-10-19 DIAGNOSIS — N186 End stage renal disease: Secondary | ICD-10-CM | POA: Diagnosis not present

## 2021-10-19 LAB — GLUCOSE, CAPILLARY
Glucose-Capillary: 90 mg/dL (ref 70–99)
Glucose-Capillary: 178 mg/dL — ABNORMAL HIGH (ref 70–99)

## 2021-10-19 LAB — CBC
HCT: 22.3 % — ABNORMAL LOW (ref 36.0–46.0)
Hemoglobin: 7.2 g/dL — ABNORMAL LOW (ref 12.0–15.0)
MCH: 31.2 pg (ref 26.0–34.0)
MCHC: 32.3 g/dL (ref 30.0–36.0)
MCV: 96.5 fL (ref 80.0–100.0)
Platelets: 55 10*3/uL — ABNORMAL LOW (ref 150–400)
RBC: 2.31 MIL/uL — ABNORMAL LOW (ref 3.87–5.11)
RDW: 17.2 % — ABNORMAL HIGH (ref 11.5–15.5)
WBC: 4 10*3/uL (ref 4.0–10.5)
nRBC: 0 % (ref 0.0–0.2)

## 2021-10-19 LAB — BASIC METABOLIC PANEL
Anion gap: 9 (ref 5–15)
BUN: 24 mg/dL — ABNORMAL HIGH (ref 8–23)
CO2: 28 mmol/L (ref 22–32)
Calcium: 8.6 mg/dL — ABNORMAL LOW (ref 8.9–10.3)
Chloride: 92 mmol/L — ABNORMAL LOW (ref 98–111)
Creatinine, Ser: 4.21 mg/dL — ABNORMAL HIGH (ref 0.44–1.00)
GFR, Estimated: 11 mL/min — ABNORMAL LOW (ref >60)
Glucose, Bld: 89 mg/dL (ref 70–99)
Potassium: 3.4 mmol/L — ABNORMAL LOW (ref 3.5–5.1)
Sodium: 129 mmol/L — ABNORMAL LOW (ref 135–145)

## 2021-10-19 LAB — MAGNESIUM: Magnesium: 1.8 mg/dL (ref 1.7–2.4)

## 2021-10-19 MED ORDER — EPOETIN ALFA 10000 UNIT/ML IJ SOLN
INTRAMUSCULAR | Status: AC
  Filled 2021-10-19: qty 1

## 2021-10-19 MED ORDER — EPOETIN ALFA 10000 UNIT/ML IJ SOLN
10000.0000 [IU] | INTRAMUSCULAR | Status: DC
  Administered 2021-10-19: 10000 [IU] via INTRAVENOUS

## 2021-10-19 MED ORDER — APIXABAN 5 MG PO TABS: 5.0000 mg | ORAL_TABLET | Freq: Two times a day (BID) | ORAL | Status: DC

## 2021-10-19 NOTE — Progress Notes (Signed)
PT requests not to do Nif and VC at this time, wishes to sleep ?

## 2021-10-19 NOTE — TOC Benefit Eligibility Note (Signed)
Patient Advocate Encounter ? ?Insurance verification completed.   ? ?The patient is currently admitted and upon discharge could be taking Entresto 24-26 mg. ? ?The current 30 day co-pay is, $0.00.  ? ?The patient is currently admitted and upon discharge could be taking isosorbide-hydralazine 20-37.5 mg. ? ?The current 30 day co-pay is, $0.00.  ? ?The patient is insured through [employer] Part D  ? ? ?Lyndel Safe, CPhT ?Pharmacy Patient Advocate Specialist ?[facility] Patient Advocate Team ?Direct Number: [phone]  Fax: [phone] ? ? ? ? ? ?  ?

## 2021-10-19 NOTE — TOC Transition Note (Signed)
Transition of Care (TOC) - CM/SW Discharge Note ? ? ?Patient Details  ?Name: Tammie Klein ?MRN: [id number] ?Date of Birth: May 30, 1951 ? ?Transition of Care (TOC) CM/SW Contact:  ?Alberteen Sam, LCSW ?Phone Number: ?10/19/2021, 2:40 PM ? ? ?Clinical Narrative:    ? ?[facility] ?Anticipated DC date: 10/19/21 ?Transport by: [facility] ? ?Per MD patient ready for DC to [facility] . RN, patient, patient's family, and facility notified of DC. Discharge Summary sent to facility. RN given number for report  [phone] Room 610B. DC packet on chart. Ambulance transport requested for patient.  ?CSW signing off. ? Pricilla Riffle, Jordan Kaytlyn Din ?[phone] ? ? ?Final next level of care: [place] ?Barriers to Discharge: No Barriers Identified ? ? ?Patient Goals and CMS Choice ?Patient states their goals for this hospitalization and ongoing recovery are:: to go home ?CMS Medicare.gov Compare Post Acute Care list provided to:: Patient ?Choice offered to / list presented to : Patient ? ?Discharge Placement ?  ?           ?Patient chooses bed at: [facility] [place] ?Patient to be transferred to facility by: [facility] ?  ?Patient and family notified of of transfer: 10/19/21 ? ?Discharge Plan and Services ?  ?Discharge Planning Services: CM Consult ?Post Acute Care Choice: [address]          ?DME Arranged: Youth worker wheelchair with seat cushion ?DME Agency: [employer] ?Date DME Agency Contacted: 10/12/21 ?Time DME Agency Contacted: [phone] ?Representative spoke with at DME Agency: Suanne Marker ?HH Arranged: RN, PT, OT ?  ?  ?  ?  ? ?Social Determinants of Health (SDOH) Interventions ?  ? ? ?Readmission Risk Interventions ?   ? View : No data to display.  ?  ?  ?  ? ? ? ? ? ?

## 2021-10-19 NOTE — TOC Progression Note (Signed)
Transition of Care (TOC) - Progression Note  ? ? ?Patient Details  ?Name: Tammie Klein ?MRN: [id number] ?Date of Birth: 1950-09-01 ? ?Transition of Care (TOC) CM/SW Contact  ?Alberteen Sam, LCSW ?Phone Number: ?10/19/2021, 1:34 PM ? ?Clinical Narrative:    ? ?Patient reports being in agreement with [facility], insurance auth has been started for [facility] and [facility] at [facility] has been updated on bed acceptance as well as patient's current HD at [facility] MWF 11:45. ? ?Pending insurance auth at this time to dc to [facility]. ? ? ?Expected Discharge Plan: [place] ?Barriers to Discharge: Continued Medical Work up ? ?Expected Discharge Plan and Services ?Expected Discharge Plan: [place] ?  ?Discharge Planning Services: CM Consult ?Post Acute Care Choice: [place] ?Living arrangements for the past 2 months: [place] ?                ?DME Arranged: Lightweight manual wheelchair with seat cushion ?DME Agency: [employer] ?Date DME Agency Contacted: 10/12/21 ?Time DME Agency Contacted: [phone] ?Representative spoke with at DME Agency: Suanne Marker ?HH Arranged: RN, PT, OT ?  ?  ?  ?  ? ? ?Social Determinants of Health (SDOH) Interventions ?  ? ?Readmission Risk Interventions ?   ? View : No data to display.  ?  ?  ?  ? ? ?

## 2021-10-19 NOTE — Progress Notes (Signed)
Physical Therapy Treatment ?Patient Details ?Name: Tammie Klein ?MRN: [id number] ?DOB: 1951-06-10 ?Today's Date: 10/19/2021 ? ? ?History of Present Illness Pt is a 71 y.o. female presenting to hospital 4/20 with c/o SOB and swelling to legs; gradually worsening B LE weakness over past several days; deteriorating health over last several months.  Recently brought to area from home in [place] by her family so pt could be cared for by family here.  Pt admitted with myasthenia gravis with acute exacerbation.  PMH includes myasthenia gravis, ESRD on HD MWF, CHF, DM, htn. ? ?  ?PT Comments  ? ? Pt seen for PT with pt demonstrating very flat affect, stating she's concerned whether she made best decision to go to rehab or not. PT provides education/encouragement & pt recognizes she needs to go to rehab, but doesn't necessarily want to. Pt is able to complete bed mobility without assistance & stand pivots to recliner with RW & supervision but declines further gait. Pt reports she just doesn't feel like doing more today. Will continue to follow pt acutely to address balance, endurance, strengthening, and gait with LRAD. ? ?   ?Recommendations for follow up therapy are one component of a multi-disciplinary discharge planning process, led by the attending physician.  Recommendations may be updated based on patient status, additional functional criteria and insurance authorization. ? ?Follow Up Recommendations ? Skilled nursing-short term rehab (<3 hours/day) ?  ?  ?Assistance Recommended at Discharge Frequent or constant Supervision/Assistance  ?Patient can return home with the following A little help with walking and/or transfers;A little help with bathing/dressing/bathroom;Assistance with cooking/housework;Help with stairs or ramp for entrance;Assist for transportation ?  ?Equipment Recommendations ? Rolling walker (2 wheels);BSC/3in1;Wheelchair (measurements PT);Wheelchair cushion (measurements PT)  ?  ?Recommendations for  Other Services   ? ? ?  ?Precautions / Restrictions Precautions ?Precautions: Fall ?Restrictions ?Weight Bearing Restrictions: No  ?  ? ?Mobility ? Bed Mobility ?Overal bed mobility: Modified Independent ?Bed Mobility: Supine to Sit ?  ?  ?Supine to sit: Modified independent (Device/Increase time), HOB elevated (HOB fully elevated, bed rails) ?  ?  ?  ?  ? ?Transfers ?Overall transfer level: Needs assistance ?Equipment used: Rolling walker (2 wheels) ?Transfers: Sit to/from Stand, Bed to chair/wheelchair/BSC ?Sit to Stand: Supervision ?  ?Step pivot transfers: Supervision ?  ?  ?  ?  ?  ? ?Ambulation/Gait ?  ?  ?  ?  ?  ?  ?  ?  ? ? ?Stairs ?  ?  ?  ?  ?  ? ? ?Wheelchair Mobility ?  ? ?Modified Rankin (Stroke Patients Only) ?  ? ? ?  ?Balance Overall balance assessment: Needs assistance ?Sitting-balance support: No upper extremity supported, Feet supported ?Sitting balance-Leahy Scale: Good ?  ?  ?Standing balance support: Bilateral upper extremity supported, During functional activity ?Standing balance-Leahy Scale: Fair ?  ?  ?  ?  ?  ?  ?  ?  ?  ?  ?  ?  ?  ? ?  ?Cognition Arousal/Alertness: Awake/alert ?Behavior During Therapy: Flat affect ?Overall Cognitive Status: Within Functional Limits for tasks assessed ?  ?  ?  ?  ?  ?  ?  ?  ?  ?  ?  ?  ?  ?  ?  ?  ?General Comments: Extremely flat affect, PT provides encouragement and attempts to lift pt's spirit but pt continues to demonstrate very flat affect. ?  ?  ? ?  ?Exercises   ? ?  ?  General Comments   ?  ?  ? ?Pertinent Vitals/Pain Pain Assessment ?Pain Assessment: No/denies pain  ? ? ?Home Living   ?  ?  ?  ?  ?  ?  ?  ?  ?  ?   ?  ?Prior Function    ?  ?  ?   ? ?PT Goals (current goals can now be found in the care plan section) Acute Rehab PT Goals ?Patient Stated Goal: to improve strength ?PT Goal Formulation: With patient ?Time For Goal Achievement: 11-25-21 ?Potential to Achieve Goals: Fair ?Progress towards PT goals: Progressing toward goals ? ?   ?Frequency ? ? ? Min 2X/week ? ? ? ?  ?PT Plan Current plan remains appropriate  ? ? ?Co-evaluation   ?  ?  ?  ?  ? ?  ?AM-PAC PT "6 Clicks" Mobility   ?Outcome Measure ? Help needed turning from your back to your side while in a flat bed without using bedrails?: None ?Help needed moving from lying on your back to sitting on the side of a flat bed without using bedrails?: None ?Help needed moving to and from a bed to a chair (including a wheelchair)?: A Little ?Help needed standing up from a chair using your arms (e.g., wheelchair or bedside chair)?: A Little ?Help needed to walk in hospital room?: A Little ?Help needed climbing 3-5 steps with a railing? : A Little ?6 Click Score: 20 ? ?  ?End of Session   ?Activity Tolerance:  (pt self limiting) ?Patient left: in chair;with chair alarm set;with call bell/phone within reach ?Nurse Communication: Mobility status (flat affect) ?PT Visit Diagnosis: Muscle weakness (generalized) (M62.81);History of falling (Z91.81);Other abnormalities of gait and mobility (R26.89) ?  ? ? ?Time: 1749-4496 ?PT Time Calculation (min) (ACUTE ONLY): 11 min ? ?Charges:  $Therapeutic Activity: 8-22 mins          ?          ? ?Lavone Nian, PT, DPT ?10/19/21, 2:21 PM ? ? ?Waunita Schooner ?10/19/2021, 2:20 PM ? ?

## 2021-10-19 NOTE — Care Management Important Message (Signed)
Important Message ? ?Patient Details  ?Name: Tammie Klein ?MRN: [id number] ?Date of Birth: Oct 27, 1950 ? ? ?Medicare Important Message Given:  Yes ? ? ? ? ?Dannette Barbara ?10/19/2021, 2:18 PM ?

## 2021-10-19 NOTE — Progress Notes (Signed)
[facility]) Hospital Liaison note: ? ?Notified, via Epic workque from Dr. Jennye Boroughs, of request for [facility] services. Will continue to follow for disposition. ? ?Please call with any outpatient palliative questions or concerns. ? ?Thank you for the opportunity to participate in this patient's care. ? ?Thank you, ?Lorelee Market, LPN ?[facility] Hospital Liaison ?[phone] ?

## 2021-10-19 NOTE — Progress Notes (Signed)
Hemodialysis Post Treatment Note ? ?October 19, 2021 ? ?Access: LUA AVF ? ?UF Removed: 0 ml ? ?Treatment Time: 3 Hours ? ?Next Scheduled Treatment: 10/22/21 ? ?Note ? ?Patient completes scheduled treatment without incident, fistula cannulates with ease, prescribed BFR maintained throughout treatment. No targeted UF set for this treatment. Medication given as ordered. Patient transported to assigned room.  ?

## 2021-10-19 NOTE — Progress Notes (Signed)
?[facility]  ?ROUNDING NOTE  ? ?Subjective:  ? ?Tammie Klein is a 71 year old female with past medical histories including chronic heart failure, diabetes, hypertension, myasthenia gravis, and end-stage renal disease on hemodialysis.  Patient presents to the emergency department with shortness of breath and lower extremity weakness.  Patient has been admitted for Myasthenia gravis with acute exacerbation ([place]) [G70.01] ?Myasthenia gravis with (acute) exacerbation ([place]) [G70.01] ? ?Patient was recently moved to the area from [place] to live with her brother.  Patient recently started receiving outpatient dialysis treatments at [facility], on a Monday Wednesday Friday schedule.  ? ?Update ?Patient seen sitting at bedside ?States she feels stronger, but agreeable to go to rehab ?She states she lives alone in an apartment and her son checks on her.  ? ? ? ?Objective:  ?Vital signs in last 24 hours:  ?Temp:  [98.1 ?F (36.7 ?C)-98.7 ?F (37.1 ?C)] 98.3 ?F (36.8 ?C) (04/28 0857) ?Pulse Rate:  [72-98] 86 (04/28 1100) ?Resp:  [13-23] 13 (04/28 1100) ?BP: (109-134)/(50-90) 128/64 (04/28 1100) ?SpO2:  [98 %-100 %] 100 % (04/28 1100) ?Weight:  [50 kg] 50 kg (04/28 0857) ? ?Weight change:  ?Filed Weights  ? 10/17/21 1900 10/18/21 0330 10/19/21 0857  ?Weight: 49.5 kg 50.6 kg 50 kg  ? ? ?Intake/Output: ?No intake/output data recorded. ?  ?Intake/Output this shift: ? No intake/output data recorded. ? ?Physical Exam: ?General: NAD  ?Head: Normocephalic, atraumatic. Moist oral mucosal membranes  ?Eyes: Anicteric  ?Lungs:  Clear to auscultation, normal effort, room air  ?Heart: Regular rate and rhythm  ?Abdomen:  Soft, nontender, nondistended  ?Extremities: No peripheral edema.  ?Neurologic: Nonfocal, moving all four extremities  ?Skin: No lesions  ?Access: Left aVF  ? ? ?Basic Metabolic Panel: ?Recent Labs  ?Lab 10/12/21 ?1140 10/14/21 ?0426 10/15/21 ?0912 10/16/21 ?0352 10/17/21 ?8756 10/18/21 ?4332  10/19/21 ?0654  ?NA 140 138 140 137 133* 133* 129*  ?K 4.7 3.7 3.9 3.7 3.3* 3.1* 3.4*  ?CL 102 102 100 100 98 96* 92*  ?CO2 21* 29 27 24 28 30 28   ?GLUCOSE 102* 101* 91 67* 103* 102* 89  ?BUN 50* 40* 49* 58* 26* 15 24*  ?CREATININE 5.28* 4.79* 6.17* 7.16* 4.13* 2.65* 4.21*  ?CALCIUM 8.0* 7.9* 8.8* 8.4* 8.0* 8.4* 8.6*  ?MG  --   --  2.0 2.1 1.7 1.7 1.8  ?PHOS 4.6 4.5  --   --   --   --   --   ? ? ? ?Liver Function Tests: ?Recent Labs  ?Lab 10/12/21 ?1140 10/14/21 ?0426  ?ALBUMIN 3.2* 2.7*  ? ? ?No results for input(s): LIPASE, AMYLASE in the last 168 hours. ?No results for input(s): AMMONIA in the last 168 hours. ? ?CBC: ?Recent Labs  ?Lab 10/15/21 ?0912 10/16/21 ?0352 10/17/21 ?9518 10/18/21 ?8416 10/19/21 ?0654  ?WBC 4.6 5.6 4.7 5.2 4.0  ?HGB 8.2* 7.8* 7.6* 7.7* 7.2*  ?HCT 25.1* 23.6* 23.4* 23.4* 22.3*  ?MCV 94.0 94.4 95.1 95.1 96.5  ?PLT 67* 58* 58* 57* 55*  ? ? ? ?Cardiac Enzymes: ?No results for input(s): CKTOTAL, CKMB, CKMBINDEX, TROPONINI in the last 168 hours. ? ? ?BNP: ?Invalid input(s): POCBNP ? ?CBG: ?Recent Labs  ?Lab 10/18/21 ?6063 10/18/21 ?1219 10/18/21 ?1617 10/18/21 ?2026 10/19/21 ?0819  ?GLUCAP 110* 202* 128* 138* 90  ? ? ? ?Microbiology: ?Results for orders placed or performed during the hospital encounter of 10/11/21  ?Resp Panel by RT-PCR (Flu A&B, Covid) Nasopharyngeal Swab     Status: None  ?  Collection Time: 10/11/21  8:34 PM  ? Specimen: Nasopharyngeal Swab; Nasopharyngeal(NP) swabs in vial transport medium  ?Result Value Ref Range Status  ? SARS Coronavirus 2 by RT PCR NEGATIVE NEGATIVE Final  ?  Comment: (NOTE) ?SARS-CoV-2 target nucleic acids are NOT DETECTED. ? ?The SARS-CoV-2 RNA is generally detectable in upper respiratory ?specimens during the acute phase of infection. The lowest ?concentration of SARS-CoV-2 viral copies this assay can detect is ?138 copies/mL. A negative result does not preclude SARS-Cov-2 ?infection and should not be used as the sole basis for treatment or ?other  patient management decisions. A negative result may occur with  ?improper specimen collection/handling, submission of specimen other ?than nasopharyngeal swab, presence of viral mutation(s) within the ?areas targeted by this assay, and inadequate number of viral ?copies(<138 copies/mL). A negative result must be combined with ?clinical observations, patient history, and epidemiological ?information. The expected result is Negative. ? ?Fact Sheet for Patients:  ?EntrepreneurPulse.com.au ? ?Fact Sheet for Healthcare Providers:  ?IncredibleEmployment.be ? ?This test is no t yet approved or cleared by the [place] FDA and  ?has been authorized for detection and/or diagnosis of SARS-CoV-2 by ?FDA under an Emergency Use Authorization (EUA). This EUA will remain  ?in effect (meaning this test can be used) for the duration of the ?COVID-19 declaration under Section 564(b)(1) of the Act, 21 ?U.S.C.section 360bbb-3(b)(1), unless the authorization is terminated  ?or revoked sooner.  ? ? ?  ? Influenza A by PCR NEGATIVE NEGATIVE Final  ? Influenza B by PCR NEGATIVE NEGATIVE Final  ?  Comment: (NOTE) ?The Xpert Xpress SARS-CoV-2/FLU/RSV plus assay is intended as an aid ?in the diagnosis of influenza from Nasopharyngeal swab specimens and ?should not be used as a sole basis for treatment. Nasal washings and ?aspirates are unacceptable for Xpert Xpress SARS-CoV-2/FLU/RSV ?testing. ? ?Fact Sheet for Patients: ?EntrepreneurPulse.com.au ? ?Fact Sheet for Healthcare Providers: ?IncredibleEmployment.be ? ?This test is not yet approved or cleared by the [place] FDA and ?has been authorized for detection and/or diagnosis of SARS-CoV-2 by ?FDA under an Emergency Use Authorization (EUA). This EUA will remain ?in effect (meaning this test can be used) for the duration of the ?COVID-19 declaration under Section 564(b)(1) of the Act, 21 U.S.C. ?section  360bbb-3(b)(1), unless the authorization is terminated or ?revoked. ? ?Performed at [facility], [place], ?[place] [address] ?  ? ? ?Coagulation Studies: ?No results for input(s): LABPROT, INR in the last 72 hours. ? ? ?Urinalysis: ?No results for input(s): COLORURINE, LABSPEC, [place], GLUCOSEU, HGBUR, BILIRUBINUR, KETONESUR, PROTEINUR, UROBILINOGEN, NITRITE, LEUKOCYTESUR in the last 72 hours. ? ?Invalid input(s): APPERANCEUR  ? ? ?Imaging: ?CT CHEST ABDOMEN PELVIS W CONTRAST ? ?Result Date: 10/18/2021 ?CLINICAL DATA:  Lower sternal mass, myasthenia gravis status post thymectomy EXAM: CT CHEST, ABDOMEN, AND PELVIS WITH CONTRAST TECHNIQUE: Multidetector CT imaging of the chest, abdomen and pelvis was performed following the standard protocol during bolus administration of intravenous contrast. RADIATION DOSE REDUCTION: This exam was performed according to the departmental dose-optimization program which includes automated exposure control, adjustment of the mA and/or kV according to patient size and/or use of iterative reconstruction technique. CONTRAST:  70mL OMNIPAQUE IOHEXOL 300 MG/ML  SOLN COMPARISON:  None. FINDINGS: CT CHEST FINDINGS Cardiovascular: Extensive multi-vessel coronary artery calcification. Global cardiac size is mildly enlarged. No pericardial effusion. Central pulmonary arteries are of normal caliber. Extensive atherosclerotic calcification noted within the thoracic aorta. No aortic aneurysm. Particularly prominent atherosclerotic calcification noted at the origin of the right  vertebral artery Mediastinum/Nodes: Multinodular thyroid goiter identified with the dominant nodule within the right lobe measuring 2.7 cm and within the left lobe measuring 2.3 cm. Infiltration within the anterior mediastinum does not expand the borders of the anterior mediastinum and may represent postsurgical change following thymectomy. The esophagus is unremarkable. No pathologic thoracic  adenopathy. Lungs/Pleura: Small bilateral pleural effusions, right greater than left. Associated mild bibasilar atelectasis. Left fifth thoracotomy defect noted with associated subpleural scarring. No confluent pulmonary infil

## 2021-10-19 NOTE — Discharge Summary (Addendum)
?Physician Discharge Summary ?  ?Patient: Tammie Klein MRN: [id number] DOB: 1951-05-18  ?Admit date:     10/11/2021  ?Discharge date:   ?Discharge Physician: Jennye Boroughs  ? ?PCP: Pcp, No  ? ?Recommendations at discharge:  ? ? ?Follow-up with general gastroenterology in 3 days of discharge. ?2.   Go to [place] dialysis center on Mondays, Wednesdays and Fridays for hemodialysis. ?3.   Outpatient follow-up with PCP and neurologist recommended ? ?Discharge Diagnoses: ?Principal Problem: ?  Myasthenia gravis with (acute) exacerbation ([place]) ?Active Problems: ?  Major depressive disorder, recurrent episode, moderate ([place]) ?  End-stage renal disease on hemodialysis ([place]) ?  Dyslipidemia ?  Essential hypertension ?  Chronic systolic CHF (congestive heart failure) ([place]) ?  Pain of right upper extremity ?  Malnutrition of moderate degree ? ? ? ?Hospital Course: ? ?[name] is a 71 y.o. 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  She recently moved from [place] to [place] to live with her son and daughter-in-law.  She presented to the hospital with shortness of breath and lower extremity weakness. ?  ? ? ?Assessment and plan ? ?  ?Myasthenia gravis exacerbation with lower extremity weakness and dysphagia, history of thymectomy:  Because of improvement in her symptoms, neurologist decided against initiating IV immunoglobulin.  Continue prednisone.   ?  ?Dysphagia to liquids: Speech therapist recommended regular diet with thin liquids. ? ?ESRD on HD, complication of dialysis access: S/p stent placement to right brachiocephalic AV access.  Continue hemodialysis on Mondays, Wednesdays and Fridays at the outpatient dialysis center ?  ?Hypokalemia: Improved.  Outpatient follow-up with nephrologist ?  ?Acute on  chronic anemia: S/p transfusion with 1 unit of PRBCs on 10/14/2021.  Outpatient follow-up with nephrologist ? ?Type II DM, recent hypoglycemia: Continue [place] ? ?History of recurrent lower extremity DVTs: Patient was restarted on Eliquis on 10/17/2021.  She prefers to try Eliquis again so Coumadin will be discontinued at discharge. ? ?Chronic systolic CHF with EF of 46%, CAD s/p PCI: Continue Lasix, Entresto and BiDil.  She is not on beta-blockers because of myasthenia gravis. ? ?Multinodular thyroid goiter found incidentally on CT chest: Thyroid ultrasound can be done as an outpatient ? 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  However, she said she was advised not to take it out because of it's location in the pancreas.  Follow-up with PCP or gastroenterologist for this. ?  ?Debility: PT and OT recommended discharge to SNF.  Follow-up with social worker to assist with disposition. ?  ?Other comorbidities include hyperlipidemia, osteoarthritis ? ? ? ?  ? ? ?Consultants: Nephrologist, neurologist, vascular surgeon ?Procedures performed: Percutaneous transluminal angioplasty and stent placement of right brachiocephalic AV access on 9/62/9528  ?Disposition: Skilled nursing facility ?Diet recommendation:  ?Discharge Diet Orders (From admission, onward)  ? ?  Start     Ordered  ? 10/19/21 0000  Diet renal with fluid restriction       ? 10/19/21 1419  ? 10/19/21 0000  Diet renal/carb modified with fluid restriction       ? 10/19/21 1420  ? ?  ?  ? ?  ? ?Renal diet ?  DISCHARGE MEDICATION: ?Allergies as of 10/19/2021   ? ?   Reactions  ? Ciprofloxacin Other (See Comments), Nausea And Vomiting  ? Muscle aches ?Muscle aches ?Muscle aches  ? Buprenorphine Hcl Nausea And Vomiting  ? Solifenacin Itching, Other (See  Comments)  ? Other reaction(s): Other ?Other reaction(s): Other ?Other reaction(s): Other  ? ?  ? ?  ?Medication List  ?  ? ?STOP taking these medications   ? ?megestrol 625 MG/5ML suspension ?Commonly known as: MEGACE ES ?  ?warfarin 1 MG tablet ?Commonly known as: COUMADIN ?  ? ?  ? ?TAKE these medications   ? ?apixaban 5 MG Tabs tablet ?Commonly known as: ELIQUIS ?Take 1 tablet (5 mg total) by mouth 2 (two) times daily. ?  ?atorvastatin 20 MG tablet ?Commonly known as: LIPITOR ?Take 20 mg by mouth daily. ?  ?bimatoprost 0.01 % Soln ?Commonly known as: LUMIGAN ?Place 1 drop into both eyes at bedtime. ?  ?carvedilol 25 MG tablet ?Commonly known as: COREG ?Take 25 mg by mouth 2 (two) times daily with a meal. ?  ?famotidine 20 MG tablet ?Commonly known as: PEPCID ?Take 20 mg by mouth daily. ?  ?furosemide 80 MG tablet ?Commonly known as: LASIX ?Take 80 mg by mouth 2 (two) times daily. ?  ?insulin degludec 200 UNIT/ML FlexTouch Pen ?Commonly known as: TRESIBA ?Inject 4-6 Units into the skin in the morning. ?  ?isosorbide-hydrALAZINE 20-37.5 MG tablet ?Commonly known as: BIDIL ?Take 1 tablet by mouth 2 (two) times daily. ?  ?lidocaine 5 % ?Commonly known as: LIDODERM ?Place 1 patch onto the skin daily. ?  ?melatonin 3 MG Tabs tablet ?Take 3 mg by mouth at bedtime. ?  ?mirtazapine 7.5 MG tablet ?Commonly known as: REMERON ?Take 7.5 mg by mouth at bedtime. ?  ?nitroGLYCERIN 0.4 MG SL tablet ?Commonly known as: NITROSTAT ?Place under the tongue. ?  ?ondansetron 4 MG tablet ?Commonly known as: ZOFRAN ?Take 8 mg by mouth every 8 (eight) hours as needed. ?  ?pentoxifylline 400 MG CR tablet ?Commonly known as: TRENTAL ?Take 400 mg by mouth daily. ?  ?predniSONE 5 MG tablet ?Commonly known as: DELTASONE ?Take 5 mg by mouth 3 (three) times a week. On HD Days, Mon-Wed-Fri ?  ?sacubitril-valsartan 24-26 MG ?Commonly known as: ENTRESTO ?Take 1 tablet by mouth 2 (two) times daily. ?  ?senna-docusate 8.6-50 MG tablet ?Commonly known  as: Senokot-S ?Take 1 tablet by mouth daily. ?  ? ?  ? ? ?Discharge Exam: ?Filed Weights  ? 10/18/21 0330 10/19/21 0857 10/19/21 1200  ?Weight: 50.6 kg 50 kg 50 kg  ? ?GEN: NAD ?SKIN: Warm and dry ?EYES: No pallor or icterus ?ENT: MMM ?CV: RRR ?PULM: CTA B ?ABD: soft, ND, NT, +BS ?CNS: AAO x 3, non focal ?EXT: No edema or tenderness ? ? ?Condition at discharge: good ? ?The results of significant diagnostics from this hospitalization (including imaging, microbiology, ancillary and laboratory) are listed below for reference.  ? ?Imaging Studies: ?CT CHEST ABDOMEN PELVIS W CONTRAST ? ?Result Date: 10/18/2021 ?CLINICAL DATA:  Lower sternal mass, myasthenia gravis status post thymectomy EXAM: CT CHEST, ABDOMEN, AND PELVIS WITH CONTRAST TECHNIQUE: Multidetector CT imaging of the chest, abdomen and pelvis was performed following the standard protocol during bolus administration of intravenous contrast. RADIATION DOSE REDUCTION: This exam was performed according to the departmental dose-optimization program which includes automated exposure control, adjustment of the mA and/or kV according to patient size and/or use of iterative reconstruction technique. CONTRAST:  18mL OMNIPAQUE  IOHEXOL 300 MG/ML  SOLN COMPARISON:  None. FINDINGS: CT CHEST FINDINGS Cardiovascular: Extensive multi-vessel coronary artery calcification. Global cardiac size is mildly enlarged. No pericardial effusion. Central pulmonary arteries are of normal caliber. Extensive atherosclerotic calcification noted within the thoracic aorta. No aortic aneurysm. Particularly prominent atherosclerotic calcification noted at the origin of the right vertebral artery Mediastinum/Nodes: Multinodular thyroid goiter identified with the dominant nodule within the right lobe measuring 2.7 cm and within the left lobe measuring 2.3 cm. Infiltration within the anterior mediastinum does not expand the borders of the anterior mediastinum and may represent postsurgical change  following thymectomy. The esophagus is unremarkable. No pathologic thoracic adenopathy. Lungs/Pleura: Small bilateral pleural effusions, right greater than left. Associated mild bibasilar atelectasis. Left fif

## 2021-10-22 ENCOUNTER — Emergency Department: Payer: Medicare Other

## 2021-10-22 ENCOUNTER — Inpatient Hospital Stay
Admission: EM | Admit: 2021-10-22 | Discharge: 2021-10-24 | DRG: 291 | Disposition: A | Discharge status: Hospice | Payer: Medicare Other | Attending: Internal Medicine | Admitting: Internal Medicine

## 2021-10-22 ENCOUNTER — Encounter: Payer: Self-pay | Admitting: Intensive Care

## 2021-10-22 ENCOUNTER — Other Ambulatory Visit: Payer: Self-pay

## 2021-10-22 DIAGNOSIS — N2581 Secondary hyperparathyroidism of renal origin: Secondary | ICD-10-CM | POA: Diagnosis present

## 2021-10-22 DIAGNOSIS — I5023 Acute on chronic systolic (congestive) heart failure: Secondary | ICD-10-CM | POA: Diagnosis present

## 2021-10-22 DIAGNOSIS — Z794 Long term (current) use of insulin: Secondary | ICD-10-CM

## 2021-10-22 DIAGNOSIS — E119 Type 2 diabetes mellitus without complications: Secondary | ICD-10-CM

## 2021-10-22 DIAGNOSIS — I132 Hypertensive heart and chronic kidney disease with heart failure and with stage 5 chronic kidney disease, or end stage renal disease: Secondary | ICD-10-CM | POA: Diagnosis not present

## 2021-10-22 DIAGNOSIS — R531 Weakness: Principal | ICD-10-CM

## 2021-10-22 DIAGNOSIS — N186 End stage renal disease: Secondary | ICD-10-CM | POA: Diagnosis present

## 2021-10-22 DIAGNOSIS — E1122 Type 2 diabetes mellitus with diabetic chronic kidney disease: Secondary | ICD-10-CM | POA: Diagnosis present

## 2021-10-22 DIAGNOSIS — D631 Anemia in chronic kidney disease: Secondary | ICD-10-CM | POA: Diagnosis present

## 2021-10-22 DIAGNOSIS — Z79899 Other long term (current) drug therapy: Secondary | ICD-10-CM

## 2021-10-22 DIAGNOSIS — Z7901 Long term (current) use of anticoagulants: Secondary | ICD-10-CM | POA: Diagnosis not present

## 2021-10-22 DIAGNOSIS — R4182 Altered mental status, unspecified: Secondary | ICD-10-CM | POA: Diagnosis not present

## 2021-10-22 DIAGNOSIS — Z881 Allergy status to other antibiotic agents status: Secondary | ICD-10-CM

## 2021-10-22 DIAGNOSIS — F331 Major depressive disorder, recurrent, moderate: Secondary | ICD-10-CM | POA: Diagnosis present

## 2021-10-22 DIAGNOSIS — R1310 Dysphagia, unspecified: Secondary | ICD-10-CM | POA: Diagnosis present

## 2021-10-22 DIAGNOSIS — Z20822 Contact with and (suspected) exposure to covid-19: Secondary | ICD-10-CM | POA: Diagnosis present

## 2021-10-22 DIAGNOSIS — D696 Thrombocytopenia, unspecified: Secondary | ICD-10-CM | POA: Diagnosis present

## 2021-10-22 DIAGNOSIS — N185 Chronic kidney disease, stage 5: Secondary | ICD-10-CM

## 2021-10-22 DIAGNOSIS — Z992 Dependence on renal dialysis: Secondary | ICD-10-CM | POA: Diagnosis not present

## 2021-10-22 DIAGNOSIS — Z888 Allergy status to other drugs, medicaments and biological substances status: Secondary | ICD-10-CM

## 2021-10-22 DIAGNOSIS — Z515 Encounter for palliative care: Secondary | ICD-10-CM

## 2021-10-22 DIAGNOSIS — R059 Cough, unspecified: Secondary | ICD-10-CM

## 2021-10-22 DIAGNOSIS — Z86718 Personal history of other venous thrombosis and embolism: Secondary | ICD-10-CM

## 2021-10-22 DIAGNOSIS — Z66 Do not resuscitate: Secondary | ICD-10-CM | POA: Diagnosis present

## 2021-10-22 DIAGNOSIS — I10 Essential (primary) hypertension: Secondary | ICD-10-CM | POA: Diagnosis present

## 2021-10-22 DIAGNOSIS — G7001 Myasthenia gravis with (acute) exacerbation: Secondary | ICD-10-CM | POA: Diagnosis present

## 2021-10-22 LAB — CBC WITH DIFFERENTIAL/PLATELET
Abs Immature Granulocytes: 0.02 10*3/uL (ref 0.00–0.07)
Basophils Absolute: 0 10*3/uL (ref 0.0–0.1)
Basophils Relative: 0 %
Eosinophils Absolute: 0.1 10*3/uL (ref 0.0–0.5)
Eosinophils Relative: 1 %
HCT: 28.2 % — ABNORMAL LOW (ref 36.0–46.0)
Hemoglobin: 8.7 g/dL — ABNORMAL LOW (ref 12.0–15.0)
Immature Granulocytes: 0 %
Lymphocytes Relative: 8 %
Lymphs Abs: 0.5 10*3/uL — ABNORMAL LOW (ref 0.7–4.0)
MCH: 30.7 pg (ref 26.0–34.0)
MCHC: 30.9 g/dL (ref 30.0–36.0)
MCV: 99.6 fL (ref 80.0–100.0)
Monocytes Absolute: 0.3 10*3/uL (ref 0.1–1.0)
Monocytes Relative: 5 %
Neutro Abs: 5.1 10*3/uL (ref 1.7–7.7)
Neutrophils Relative %: 86 %
Platelets: 59 10*3/uL — ABNORMAL LOW (ref 150–400)
RBC: 2.83 MIL/uL — ABNORMAL LOW (ref 3.87–5.11)
RDW: 16.8 % — ABNORMAL HIGH (ref 11.5–15.5)
WBC: 5.9 10*3/uL (ref 4.0–10.5)
nRBC: 0 % (ref 0.0–0.2)

## 2021-10-22 LAB — BRAIN NATRIURETIC PEPTIDE: B Natriuretic Peptide: >4500 pg/mL — ABNORMAL HIGH (ref 0.0–100.0)

## 2021-10-22 LAB — COMPREHENSIVE METABOLIC PANEL
ALT: 41 U/L (ref 0–44)
AST: 36 U/L (ref 15–41)
Albumin: 3.3 g/dL — ABNORMAL LOW (ref 3.5–5.0)
Alkaline Phosphatase: 68 U/L (ref 38–126)
Anion gap: 12 (ref 5–15)
BUN: 16 mg/dL (ref 8–23)
CO2: 27 mmol/L (ref 22–32)
Calcium: 8.8 mg/dL — ABNORMAL LOW (ref 8.9–10.3)
Chloride: 97 mmol/L — ABNORMAL LOW (ref 98–111)
Creatinine, Ser: 3.04 mg/dL — ABNORMAL HIGH (ref 0.44–1.00)
GFR, Estimated: 16 mL/min — ABNORMAL LOW (ref >60)
Glucose, Bld: 121 mg/dL — ABNORMAL HIGH (ref 70–99)
Potassium: 4.5 mmol/L (ref 3.5–5.1)
Sodium: 136 mmol/L (ref 135–145)
Total Bilirubin: 1.3 mg/dL — ABNORMAL HIGH (ref 0.3–1.2)
Total Protein: 7.4 g/dL (ref 6.5–8.1)

## 2021-10-22 LAB — HEPARIN LEVEL (UNFRACTIONATED): Heparin Unfractionated: >1.1 IU/mL — ABNORMAL HIGH (ref 0.30–0.70)

## 2021-10-22 LAB — TROPONIN I (HIGH SENSITIVITY)
Troponin I (High Sensitivity): 40 ng/L — ABNORMAL HIGH (ref <18)
Troponin I (High Sensitivity): 36 ng/L — ABNORMAL HIGH (ref <18)

## 2021-10-22 LAB — PROTIME-INR
INR: 1.6 — ABNORMAL HIGH (ref 0.8–1.2)
Prothrombin Time: 19.1 seconds — ABNORMAL HIGH (ref 11.4–15.2)

## 2021-10-22 LAB — CBG MONITORING, ED: Glucose-Capillary: 120 mg/dL — ABNORMAL HIGH (ref 70–99)

## 2021-10-22 LAB — APTT: aPTT: 36 seconds (ref 24–36)

## 2021-10-22 LAB — RESP PANEL BY RT-PCR (FLU A&B, COVID) ARPGX2
Influenza A by PCR: NEGATIVE
Influenza B by PCR: NEGATIVE
SARS Coronavirus 2 by RT PCR: NEGATIVE

## 2021-10-22 MED ORDER — ACETAMINOPHEN 650 MG RE SUPP: 650.0000 mg | Freq: Four times a day (QID) | RECTAL | Status: DC | PRN

## 2021-10-22 MED ORDER — ONDANSETRON HCL 4 MG/2ML IJ SOLN: 4.0000 mg | Freq: Four times a day (QID) | INTRAMUSCULAR | Status: DC | PRN

## 2021-10-22 MED ORDER — ONDANSETRON HCL 4 MG PO TABS: 4.0000 mg | ORAL_TABLET | Freq: Four times a day (QID) | ORAL | Status: DC | PRN

## 2021-10-22 MED ORDER — ACETAMINOPHEN 325 MG PO TABS: 650.0000 mg | ORAL_TABLET | Freq: Four times a day (QID) | ORAL | Status: DC | PRN

## 2021-10-22 MED ORDER — HYDROCORTISONE SOD SUC (PF) 100 MG IJ SOLR: 100.0000 mg | Freq: Three times a day (TID) | INTRAMUSCULAR | Status: DC

## 2021-10-22 MED ORDER — INSULIN ASPART 100 UNIT/ML IJ SOLN: 0.0000 [IU] | INTRAMUSCULAR | Status: DC

## 2021-10-22 MED ORDER — HEPARIN (PORCINE) 25000 UT/250ML-% IV SOLN
850.0000 [IU]/h | INTRAVENOUS | Status: DC
  Administered 2021-10-22: 850 [IU]/h via INTRAVENOUS

## 2021-10-22 MED ORDER — HEPARIN BOLUS VIA INFUSION
1600.0000 [IU] | Freq: Once | INTRAVENOUS | Status: AC
  Administered 2021-10-22: 1600 [IU] via INTRAVENOUS
  Filled 2021-10-22: qty 1600

## 2021-10-22 MED ORDER — FUROSEMIDE 10 MG/ML IJ SOLN
20.0000 mg | Freq: Two times a day (BID) | INTRAMUSCULAR | Status: DC
  Administered 2021-10-23: 20 mg via INTRAVENOUS
  Filled 2021-10-22: qty 4

## 2021-10-22 NOTE — Assessment & Plan Note (Signed)
Possibly related to myasthenia gravis ?Keep n.p.o. ?Speech therapy evaluation ?

## 2021-10-22 NOTE — Assessment & Plan Note (Addendum)
Secondary to history of multiple DVTs involving her right lower extremity most recent in August 2022 ? ?N.p.o. and with high aspiration risk we will place on heparin until safe to swallow ?

## 2021-10-22 NOTE — Assessment & Plan Note (Signed)
EF in March 2023 at hospital in [place] 25 to 30% (discharge summary reviewed) ?Chest x-ray showing pulmonary vascular congestion.  BNP pending ?Holding oral meds (Entresto, BiDil, Coreg) due to dysphagia and concern for aspiration ?IV Lasix and IV metoprolol while n.p.o. ?Daily weights with intake and output monitoring ?Cardiology consult ?

## 2021-10-22 NOTE — Assessment & Plan Note (Signed)
Recently hospitalized for weakness that was thought more related to physical deconditioning rather than myasthenia flare.  IVIG not given and patient was continued on her chronic prednisone therapy and discharged to SNF ?Curbside consult was done with Dr. Malen Gauze neurologist while in the ED with recommendation for consult in the a.m. for consideration of IVIG or plasmapheresis ?IV steroids for now due to dysphagia ?Neurology consult in the a.m. to evaluate for myasthenia gravis flare ?

## 2021-10-22 NOTE — Assessment & Plan Note (Signed)
As needed metoprolol or hydralazine for BP control is currently n.p.o. ?

## 2021-10-22 NOTE — Progress Notes (Signed)
Attempted the NIF and FVC. Pt has poor effort, encouragement by family at bedside. Dr. Vladimir Crofts aware and at bedside speaking with family. ?

## 2021-10-22 NOTE — ED Notes (Signed)
Respiratory at bedside.

## 2021-10-22 NOTE — Assessment & Plan Note (Signed)
Platelet count 59,000 ?Monitor for worsening of bleeding as patient is on heparin infusion ?

## 2021-10-22 NOTE — Progress Notes (Signed)
ANTICOAGULATION CONSULT NOTE - Initial Consult ? ?Pharmacy Consult for heparin consult ?Indication: VTE (on Eliquis, unable to swallow) ? ?Allergies  ?Allergen Reactions  ? Ciprofloxacin Other (See Comments) and Nausea And Vomiting  ?  Muscle aches ?Muscle aches ?Muscle aches ?  ? Buprenorphine Hcl Nausea And Vomiting  ? Solifenacin Itching and Other (See Comments)  ?  Other reaction(s): Other ?Other reaction(s): Other ?Other reaction(s): Other ?  ? ? ?Patient Measurements: ?Height: 5\' 3"  (160 cm) ?Weight: 54.4 kg (120 lb) ?IBW/kg (Calculated) : 52.4 ?Heparin Dosing Weight: 54.4 kg ? ?Vital Signs: ?Temp: 99.9 ?F (37.7 ?C) (05/01 1732) ?Temp Source: Axillary (05/01 1732) ?BP: 138/69 (05/01 2019) ?Pulse Rate: 98 (05/01 2019) ? ?Labs: ?Recent Labs  ?  10/22/21 ?1738 10/22/21 ?2010  ?HGB 8.7*  --   ?HCT 28.2*  --   ?PLT 59*  --   ?LABPROT 19.1*  --   ?INR 1.6*  --   ?CREATININE 3.04*  --   ?TROPONINIHS 40* 36*  ? ? ?Estimated Creatinine Clearance: 14.7 mL/min (A) (by C-G formula based on SCr of 3.04 mg/dL (H)). ? ? ?Medical History: ?Past Medical History:  ?Diagnosis Date  ? Blood transfusion without reported diagnosis   ? CHF (congestive heart failure) ([place])   ? Diabetes mellitus without complication ([place])   ? Hypertension   ? Renal disorder   ? ? ?Medications:  ?PTA: Eliquis 5 mg BID, Last dose 10/22/21 @ 0900 ? ?Assessment: ?Pt is 71 yo female being transitioned from Eliquis to heparin infusion for VTE Tx due to pt's inability to swallow at this time. ? ?Goal of Therapy:  ?Heparin level 0.3-0.7 units/ml ?aPTT 66-102 seconds ?Monitor platelets by anticoagulation protocol: Yes ?  ?Plan:  ?Bolus 1600 units x 1 ?Start heparin infusion at 850 units/hr ?Will check aPTT in 8 hrs after start of infusion ?Will follow aPTT until correlation with HL confirmed ?HL and CBC daily while on heparin. ? ?Renda Rolls, PharmD, MBA ?10/23/2021 ?4:02 AM ? ? ? ?

## 2021-10-22 NOTE — ED Triage Notes (Signed)
Patient arrived by EMS from [place] dialysis. Dialysis staff report pulling off 983mL. Staff reports after finishing treatment she became altered. Patient reports she no longer makes urine. Patient oriented to self only. Access on right arm ?

## 2021-10-22 NOTE — H&P (Addendum)
?History and Physical  ? ? ?Patient: Tammie Klein KDX:[id number] DOB: 02-Sep-1950 ?DOA: 10/22/2021 ?DOS: the patient was seen and examined on 10/22/2021 ?PCP: Juluis Pitch, MD  ?Patient coming from: SNF ? ?Chief Complaint:  ?Chief Complaint  ?Patient presents with  ? Altered Mental Status  ? ? ?HPI: Tammie Klein is a 71 y.o. female with medical history significant for Myasthenia gravis s/p thymectomy and on prednisone, ESRD on HD MWF with chronic anemia, history of VTE on Eliquis, HTN, systolic CHF with last EF 25 to 30% in March 2023, major depressive disorder, recently relocated from [place] a month ago to live with her son here in [place] following a fall and for concerns for continued independent living, and who was recently hospitalized here from 4/22- 10/19/21 with exacerbation of myasthenia gravis, treated with prednisone (IVIG deferred due to improving symptoms) who was sent from dialysis for admission for evaluation of an episode of altered mental status following completion of her dialysis session today.  Patient had removal of just under 1000 L.  Most of the history is provided by son and daughter-in-law at bedside who stated that they are concerned about patient's protracted weakness since her discharge and also about reports that she was not being nice to the nursing home staff which is not her baseline.  Patient is very lethargic but contributes to the history by nodding in agreement during times of the interview.  She is noted to have a congested cough which family states started about 2 days ago.  They are concerned that she is having difficulty swallowing especially liquids. ?ED course and data review: Temperature 99.9 with pulse of 98 and respirations 30-32 with blood pressure 149/62, O2 sat of 100% on room air.  WBC 5900 with hemoglobin 8.7 which is her baseline.  CMP at baseline.  Troponin 36.  COVID and flu negative EKG, personally viewed and interpreted showing sinus at 87 with  nonspecific ST-T wave changes.  Chest x-ray shows cardiomegaly with mild pulmonary vascular congestion.  CT head shows no acute intracranial abnormality.  Chronic microvascular ischemic changes and cerebral volume loss.  Hospitalist consulted for admission.  ? ?Review of Systems  ?Unable to perform ROS: Mental acuity  ? ?Past Medical History:  ?Diagnosis Date  ? Blood transfusion without reported diagnosis   ? CHF (congestive heart failure) ([place])   ? Diabetes mellitus without complication ([place])   ? Hypertension   ? Renal disorder   ? ?Past Surgical History:  ?Procedure Laterality Date  ? A/V FISTULAGRAM Right 10/16/2021  ? Procedure: A/V Fistulagram;  Surgeon: Katha Cabal, MD;  Location: [facility];  Service: Cardiovascular;  Laterality: Right;  ? ABDOMINAL HYSTERECTOMY    ? CARDIAC CATHETERIZATION  2023  ? Thymus Gland Removal    ? ?Social History:  reports that she has never smoked. She has never used smokeless tobacco. She reports that she does not drink alcohol and does not use drugs. ? ?Allergies  ?Allergen Reactions  ? Ciprofloxacin Other (See Comments) and Nausea And Vomiting  ?  Muscle aches ?Muscle aches ?Muscle aches ?  ? Buprenorphine Hcl Nausea And Vomiting  ? Solifenacin Itching and Other (See Comments)  ?  Other reaction(s): Other ?Other reaction(s): Other ?Other reaction(s): Other ?  ? ? ?History reviewed. No pertinent family history. ? ?Prior to Admission medications   ?Medication Sig Start Date End Date Taking? Authorizing Provider  ?apixaban (ELIQUIS) 5 MG TABS tablet Take 1 tablet (5 mg total) by mouth 2 (  two) times daily. 10/19/21   Jennye Boroughs, MD  ?atorvastatin (LIPITOR) 20 MG tablet Take 20 mg by mouth daily. 03/02/21   [provider]  ?bimatoprost (LUMIGAN) 0.01 % SOLN Place 1 drop into both eyes at bedtime.    [provider]  ?carvedilol (COREG) 25 MG tablet Take 25 mg by mouth 2 (two) times daily with a meal. 10/21/19   [provider]   ?famotidine (PEPCID) 20 MG tablet Take 20 mg by mouth daily. 09/17/18   [provider]  ?furosemide (LASIX) 80 MG tablet Take 80 mg by mouth 2 (two) times daily. 09/27/20   [provider]  ?insulin degludec (TRESIBA) 200 UNIT/ML FlexTouch Pen Inject 4-6 Units into the skin in the morning. 09/28/18   [provider]  ?isosorbide-hydrALAZINE (BIDIL) 20-37.5 MG tablet Take 1 tablet by mouth 2 (two) times daily. 04/23/21   [provider]  ?lidocaine (LIDODERM) 5 % Place 1 patch onto the skin daily. 05/24/21   [provider]  ?melatonin 3 MG TABS tablet Take 3 mg by mouth at bedtime. 09/23/21   [provider]  ?mirtazapine (REMERON) 7.5 MG tablet Take 7.5 mg by mouth at bedtime. 09/23/21   [provider]  ?nitroGLYCERIN (NITROSTAT) 0.4 MG SL tablet Place under the tongue. 03/27/21   [provider]  ?ondansetron (ZOFRAN) 4 MG tablet Take 8 mg by mouth every 8 (eight) hours as needed. 04/16/21 04/16/22  [provider]  ?pentoxifylline (TRENTAL) 400 MG CR tablet Take 400 mg by mouth daily. 12/10/19   [provider]  ?predniSONE (DELTASONE) 5 MG tablet Take 5 mg by mouth 3 (three) times a week. On HD Days, Mon-Wed-Fri 08/09/21   [provider]  ?sacubitril-valsartan (ENTRESTO) 24-26 MG Take 1 tablet by mouth 2 (two) times daily. 05/22/21   [provider]  ?senna-docusate (SENOKOT-S) 8.6-50 MG tablet Take 1 tablet by mouth daily. 03/13/21   [provider]  ? ? ?Physical Exam: ?Vitals:  ? 10/22/21 1732 10/22/21 1734 10/22/21 2019  ?BP: (!) 149/62  138/69  ?Pulse: 88  98  ?Resp: (!) 30  (!) 32  ?Temp: 99.9 ?F (37.7 ?C)    ?TempSrc: Axillary    ?SpO2: 100%  98%  ?Weight:  54.4 kg   ?Height:  5\' 3"  (1.6 m)   ? ?Physical Exam ?Vitals and nursing note reviewed.  ?Constitutional:   ?   General: She is not in acute distress. ?   Appearance: She is ill-appearing.  ?HENT:  ?   Head: Normocephalic and atraumatic.   ?Cardiovascular:  ?   Rate and Rhythm: Normal rate and regular rhythm.  ?   Pulses: Normal pulses.  ?   Heart sounds: Normal heart sounds.  ?Pulmonary:  ?   Effort: Tachypnea present.  ?   Breath sounds: Rales present.  ?Abdominal:  ?   Palpations: Abdomen is soft.  ?   Tenderness: There is no abdominal tenderness.  ?Neurological:  ?   Mental Status: She is lethargic.  ? ? ? ?Data Reviewed: ?Relevant notes from primary care and specialist visits, past discharge summaries as available in EHR, including Care Everywhere. ?Prior diagnostic testing as pertinent to current admission diagnoses ?Updated medications and problem lists for reconciliation ?ED course, including vitals, labs, imaging, treatment and response to treatment ?Triage notes, nursing and pharmacy notes and ED provider's notes ?Notable results as noted in HPI ? ? ?Assessment and Plan: ?Generalized weakness ?Recently hospitalized for weakness that was thought more related  to physical deconditioning rather than myasthenia flare.  IVIG not given and patient was continued on her chronic prednisone therapy and discharged to SNF ?Curbside consult was done with Dr. Malen Gauze neurologist while in the ED with recommendation for consult in the a.m. for consideration of IVIG or plasmapheresis ?IV steroids for now due to dysphagia ?Neurology consult in the a.m. to evaluate for myasthenia gravis flare ? ?Altered mental status ?Sent from dialysis due to altered mental status.  Patient is very lethargic.  Family states at baseline patient is more interactive ?Neurologic checks and fall and aspiration precautions ? ?Cough, possible aspiration ?Patient with dysphagia and a congested cough and tachypneic ?WBC normal and chest x-ray not showing infiltrate ?We will treat empirically for possible aspiration pneumonitis/pneumonia ?Rocephin and will get procalcitonin ?Keep n.p.o. and speech therapy eval ? ? ?Dysphagia ?Possibly related to myasthenia gravis ?Keep n.p.o. ?Speech  therapy evaluation ? ?Acute on chronic systolic CHF (congestive heart failure) ([place]) ?EF in March 2023 at hospital in [place] 25 to 30% (discharge summary reviewed) ?Chest x-ray showing pulmonary vascular congestion.  BNP pend

## 2021-10-22 NOTE — Assessment & Plan Note (Signed)
Sent from dialysis due to altered mental status.  Patient is very lethargic.  Family states at baseline patient is more interactive ?Neurologic checks and fall and aspiration precautions ?

## 2021-10-22 NOTE — Assessment & Plan Note (Addendum)
Patient is status post thymectomy and is on chronic prednisone ?IVIG from recent hospitalization 4/20 to 4/28 was deferred as weakness was thought not related to myasthenia per review of neurology note ?Neurologist, Dr. Malen Gauze curbside consulted from the ED recommends neurology consult for consideration of IVIG or plasmapheresis in the a.m. ?IV steroids while awaiting consult as patient n.p.o. ?

## 2021-10-22 NOTE — Assessment & Plan Note (Signed)
Sliding scale insulin coverage.  Hemoglobin A1c was 6 a week ago ?

## 2021-10-22 NOTE — ED Provider Notes (Addendum)
? ?[facility] ?Provider Note ? ? ? Event Date/Time  ? First MD Initiated Contact with Patient 10/22/21 1733   ?  (approximate) ? ? ?History  ? ?Altered Mental Status ? ? ?HPI ? ?Tammie Klein is a 71 y.o. female who presents to the ED for evaluation of Altered Mental Status ?  ?I reviewed DC summary from just 3 days ago.  Dialysis patient with myasthenia gravis, DVT on Eliquis previously Coumadin, combined CHF.  Was admitted for weakness and dysphagia, MG exacerbation treated with steroids but not IVIG.  MWF hemodialysis and had a stent placed to her right-sided brachiocephalic AV fistula and she is here.  Maintenance prednisone 5 mg daily.  ? ?Patient presents to the ED via EMS from her dialysis facility for evaluation of weakness and altered mentation at the end of her dialysis session.  She reports she feels sick, but denies any pain anywhere.  She does not provide much history. ? ?Majority of history is provided by daughter-in-law and son and they later arrived to the bedside.  They tell me that she has been increasingly weak since they moved her down here from [place] to help care for her.  Is been a couple weeks now.  She seems short of breath, weak and has swollen legs to them. ? ?They report that she was initially thinking about just stopping dialysis and going on hospice care, patient told kids about this last week, and initially refused dialysis today but ultimately relented and completed a full session. ? ?Son also reports concern for dysphagia and choking episodes ? ?Physical Exam  ? ?Triage Vital Signs: ?ED Triage Vitals  ?Enc Vitals Group  ?   BP   ?   Pulse   ?   Resp   ?   Temp   ?   Temp src   ?   SpO2   ?   Weight   ?   Height   ?   Head Circumference   ?   Peak Flow   ?   Pain Score   ?   Pain Loc   ?   Pain Edu?   ?   Excl. in [place]?   ? ? ?Most recent vital signs: ?Vitals:  ? 10/22/21 1732 10/22/21 2019  ?BP: (!) 149/62 138/69  ?Pulse: 88 98  ?Resp: (!) 30 (!) 32  ?Temp:  99.9 ?F (37.7 ?C)   ?SpO2: 100% 98%  ? ? ?General: Awake.  Seems weak and tired. ?CV:  Good peripheral perfusion.  ?Resp:  Tachypneic to 30-32, faint bibasilar crackles.  No wheezing. ?Abd:  No distention.  ?MSK:  No deformity noted.  AV fistula to the right upper extremity without bleeding.  ?Neuro:  Weak and symmetric bilateral lower extremities, can fire the muscles but cannot even lift them off the bed, symmetric 1/5 strength. ?4+/5 strength bilateral upper extremities symmetrically ?Other:   ? ? ?ED Results / Procedures / Treatments  ? ?Labs ?(all labs ordered are listed, but only abnormal results are displayed) ?Labs Reviewed  ?COMPREHENSIVE METABOLIC PANEL - Abnormal; Notable for the following components:  ?    Result Value  ? Chloride 97 (*)   ? Glucose, Bld 121 (*)   ? Creatinine, Ser 3.04 (*)   ? Calcium 8.8 (*)   ? Albumin 3.3 (*)   ? Total Bilirubin 1.3 (*)   ? GFR, Estimated 16 (*)   ? All other components within normal limits  ?CBC WITH DIFFERENTIAL/PLATELET -  Abnormal; Notable for the following components:  ? RBC 2.83 (*)   ? Hemoglobin 8.7 (*)   ? HCT 28.2 (*)   ? RDW 16.8 (*)   ? Platelets 59 (*)   ? Lymphs Abs 0.5 (*)   ? All other components within normal limits  ?PROTIME-INR - Abnormal; Notable for the following components:  ? Prothrombin Time 19.1 (*)   ? INR 1.6 (*)   ? All other components within normal limits  ?TROPONIN I (HIGH SENSITIVITY) - Abnormal; Notable for the following components:  ? Troponin I (High Sensitivity) 40 (*)   ? All other components within normal limits  ?RESP PANEL BY RT-PCR (FLU A&B, COVID) [id number]  ?TROPONIN I (HIGH SENSITIVITY)  ? ? ?EKG ?Sinus rhythm, rate of 87 bpm.  PR interval prolonged at 245 ms.  Normal axis.  No STEMI.  Poor quality EKG. ? ?RADIOLOGY ?1 view CXR reviewed by me with cardiomegaly and pulmonary vascular congestion without pleural effusion. ?CT head reviewed by me without evidence of acute intracranial pathology ? ?Official radiology report(s): ?CT  HEAD WO CONTRAST (5MM) ? ?Result Date: 10/22/2021 ?CLINICAL DATA:  Weakness, somnolence EXAM: CT HEAD WITHOUT CONTRAST TECHNIQUE: Contiguous axial images were obtained from the base of the skull through the vertex without intravenous contrast. RADIATION DOSE REDUCTION: This exam was performed according to the departmental dose-optimization program which includes automated exposure control, adjustment of the mA and/or kV according to patient size and/or use of iterative reconstruction technique. COMPARISON:  None. FINDINGS: Brain: No evidence of acute infarction, hemorrhage, hydrocephalus, extra-axial collection or mass lesion/mass effect. Patchy low-density changes within the periventricular and subcortical white matter compatible with chronic microvascular ischemic change. Mild diffuse cerebral volume loss. Vascular: Atherosclerotic calcifications involving the large vessels of the skull base. No unexpected hyperdense vessel. Skull: Normal. Negative for fracture or focal lesion. Sinuses/Orbits: No acute finding. Other: None. IMPRESSION: 1. No acute intracranial abnormality. 2. Chronic microvascular ischemic change and cerebral volume loss. Electronically Signed   By: Davina Poke D.O.   On: 10/22/2021 19:38  ? ?DG Chest Portable 1 View ? ?Result Date: 10/22/2021 ?CLINICAL DATA:  Altered mental status. Productive cough. On dialysis. EXAM: PORTABLE CHEST 1 VIEW COMPARISON:  Chest radiograph 10/11/2021 and CT 10/18/2021 FINDINGS: The cardiac silhouette remains enlarged. Prior median sternotomy and aortic atherosclerosis are noted. A small linear device projecting over the inferior left hilar region is unchanged. There is mild pulmonary vascular congestion without overt edema. No focal airspace consolidation, sizeable pleural effusion, or pneumothorax is identified. No acute osseous abnormality is seen. IMPRESSION: Cardiomegaly and mild pulmonary vascular congestion. Electronically Signed   By: Logan Bores M.D.   On:  10/22/2021 18:09   ? ?PROCEDURES and INTERVENTIONS: ? ?.1-3 Lead EKG Interpretation ?Performed by: Vladimir Crofts, MD ?Authorized by: Vladimir Crofts, MD  ? ?  Interpretation: normal   ?  ECG rate:  90 ?  ECG rate assessment: normal   ?  Rhythm: sinus rhythm   ?  Ectopy: none   ?  Conduction: normal   ?.Critical Care ?Performed by: Vladimir Crofts, MD ?Authorized by: Vladimir Crofts, MD  ? ?Critical care provider statement:  ?  Critical care time (minutes):  75 ?  Critical care time was exclusive of:  Separately billable procedures and treating other patients ?  Critical care was necessary to treat or prevent imminent or life-threatening deterioration of the following conditions:  CNS failure or compromise ?  Critical care was time spent personally by me on  the following activities:  Development of treatment plan with patient or surrogate, discussions with consultants, evaluation of patient's response to treatment, examination of patient, ordering and review of laboratory studies, ordering and review of radiographic studies, ordering and performing treatments and interventions, pulse oximetry, re-evaluation of patient's condition and review of old charts ? ?Medications - No data to display ? ? ?IMPRESSION / MDM / ASSESSMENT AND PLAN / ED COURSE  ?I reviewed the triage vital signs and the nursing notes. ? ?71 year old female presents to the ED with confusion and altered mentation from her dialysis center, with some concerning weakness for myasthenia gravis crisis and requiring medical admission.  She has a nonfocal exam with significant weakness to bilateral lower extremities and apparent bulbar weakness.  Her NIF is very low and intubation with mechanical ventilation is a consideration, but family is very clear that she would not want any life prolonging measures.  She has recently been discussing hospice in the past 1 week and seems interested in this, patient is confused today and does not seem to have capacity to make this  decision.  She has chronic anemia, stigmata of ESRD and mildly elevated troponin.  She appears volume overloaded and has a congested CXR.  We will consult with medicine for admission to help tease out

## 2021-10-22 NOTE — ED Notes (Signed)
Respiratory called to perform treatment  ?

## 2021-10-22 NOTE — Assessment & Plan Note (Signed)
See above. 

## 2021-10-22 NOTE — ED Notes (Signed)
RT called to complete negative inspiratory force and vital capacity ?

## 2021-10-22 NOTE — ED Notes (Addendum)
Pt placed on purewick and adjusted to a level of comfort. Denies any other needs at this time. Call light within reach. ?

## 2021-10-22 NOTE — Assessment & Plan Note (Signed)
Nephrology consult for continuation of dialysis MWF ?

## 2021-10-22 NOTE — ED Notes (Signed)
ED Provider at bedside. 

## 2021-10-22 NOTE — Assessment & Plan Note (Addendum)
Patient with dysphagia and a congested cough and tachypneic ?WBC normal and chest x-ray not showing infiltrate ?We will treat empirically for possible aspiration pneumonitis/pneumonia ?Rocephin and will get procalcitonin ?Keep n.p.o. and speech therapy eval ? ?

## 2021-10-22 NOTE — Assessment & Plan Note (Signed)
Review of discharge summary from March reveals that patient had suicidal ideation.  Patient is agreeable to being evaluated by a psychiatrist. ?

## 2021-10-23 ENCOUNTER — Encounter: Payer: Self-pay | Admitting: Internal Medicine

## 2021-10-23 DIAGNOSIS — I5023 Acute on chronic systolic (congestive) heart failure: Secondary | ICD-10-CM | POA: Diagnosis present

## 2021-10-23 DIAGNOSIS — G7001 Myasthenia gravis with (acute) exacerbation: Secondary | ICD-10-CM

## 2021-10-23 LAB — CBC
HCT: 25.6 % — ABNORMAL LOW (ref 36.0–46.0)
Hemoglobin: 8 g/dL — ABNORMAL LOW (ref 12.0–15.0)
MCH: 31.3 pg (ref 26.0–34.0)
MCHC: 31.3 g/dL (ref 30.0–36.0)
MCV: 100 fL (ref 80.0–100.0)
Platelets: 47 10*3/uL — ABNORMAL LOW (ref 150–400)
RBC: 2.56 MIL/uL — ABNORMAL LOW (ref 3.87–5.11)
RDW: 16.9 % — ABNORMAL HIGH (ref 11.5–15.5)
WBC: 6.6 10*3/uL (ref 4.0–10.5)
nRBC: 0 % (ref 0.0–0.2)

## 2021-10-23 LAB — BASIC METABOLIC PANEL
Anion gap: 17 — ABNORMAL HIGH (ref 5–15)
BUN: 26 mg/dL — ABNORMAL HIGH (ref 8–23)
CO2: 23 mmol/L (ref 22–32)
Calcium: 9 mg/dL (ref 8.9–10.3)
Chloride: 97 mmol/L — ABNORMAL LOW (ref 98–111)
Creatinine, Ser: 4.01 mg/dL — ABNORMAL HIGH (ref 0.44–1.00)
GFR, Estimated: 12 mL/min — ABNORMAL LOW (ref >60)
Glucose, Bld: 123 mg/dL — ABNORMAL HIGH (ref 70–99)
Potassium: 4.9 mmol/L (ref 3.5–5.1)
Sodium: 137 mmol/L (ref 135–145)

## 2021-10-23 LAB — CBG MONITORING, ED
Glucose-Capillary: 118 mg/dL — ABNORMAL HIGH (ref 70–99)
Glucose-Capillary: 89 mg/dL (ref 70–99)

## 2021-10-23 LAB — APTT: aPTT: 35 seconds (ref 24–36)

## 2021-10-23 MED ORDER — METHYLPREDNISOLONE SODIUM SUCC 40 MG IJ SOLR
40.0000 mg | Freq: Two times a day (BID) | INTRAMUSCULAR | Status: DC
Start: 2021-10-23 — End: 2021-10-23
  Administered 2021-10-23: 40 mg via INTRAVENOUS
  Filled 2021-10-23: qty 1

## 2021-10-23 MED ORDER — HYDROMORPHONE HCL 1 MG/ML IJ SOLN
0.5000 mg | INTRAMUSCULAR | Status: DC | PRN
  Administered 2021-10-23: 0.5 mg via INTRAVENOUS
  Filled 2021-10-23: qty 1

## 2021-10-23 MED ORDER — FUROSEMIDE 10 MG/ML IJ SOLN: 80.0000 mg | Freq: Two times a day (BID) | INTRAMUSCULAR | Status: DC

## 2021-10-23 MED ORDER — GLYCOPYRROLATE 0.2 MG/ML IJ SOLN
0.2000 mg | INTRAMUSCULAR | Status: DC | PRN
  Administered 2021-10-24: 0.2 mg via INTRAVENOUS
  Filled 2021-10-23: qty 1

## 2021-10-23 MED ORDER — LORAZEPAM 2 MG/ML IJ SOLN: 1.0000 mg | INTRAMUSCULAR | Status: DC | PRN

## 2021-10-23 MED ORDER — ALBUTEROL SULFATE (2.5 MG/3ML) 0.083% IN NEBU
2.5000 mg | INHALATION_SOLUTION | Freq: Once | RESPIRATORY_TRACT | Status: AC | PRN
  Administered 2021-10-23: 2.5 mg via RESPIRATORY_TRACT
  Filled 2021-10-23: qty 3

## 2021-10-23 MED ORDER — GUAIFENESIN-DM 100-10 MG/5ML PO SYRP: 5.0000 mL | ORAL_SOLUTION | ORAL | Status: DC | PRN

## 2021-10-23 NOTE — TOC Initial Note (Signed)
Transition of Care (TOC) - Initial/Assessment Note  ? ? ?Patient Details  ?Name: Tammie Klein ?MRN: [id number] ?Date of Birth: [birth year] ? ?Transition of Care (TOC) CM/SW Contact:    ?[name], RN ?Phone Number: ?10/23/2021, 2:53 PM ? ?Clinical Narrative:                 ?Patient admitted to the hospital for acute on chronic systolic heart failure, patient also has myasthenia gravis and end stage renal disease on dialysis. ?Patient has decided that she does not want to continue dialysis and just wants to be comfortable.  Family is agreeable to hospice- they cannot provide round the clock care at home so referral given to [facility] for hospice home admission.  Daphene Calamity with [facility] will go and speak with the patient and her son and daughter in law at the bedside.   ? ?Expected Discharge Plan: [place] ?Barriers to Discharge: Other (must enter comment) (referral given to hospice- [facility]) ? ? ?Patient Goals and CMS Choice ?Patient states their goals for this hospitalization and ongoing recovery are:: Comfort care- hospice facility- no more dialysis ?CMS Medicare.gov Compare Post Acute Care list provided to:: Patient ?Choice offered to / list presented to : Patient, Adult Children ? ?Expected Discharge Plan and Services ?Expected Discharge Plan: [place] ?  ?Discharge Planning Services: CM Consult ?Post Acute Care Choice: Hospice ?Living arrangements for the past 2 months: [place] ?                ?DME Arranged: N/A ?DME Agency: NA ?  ?  ?  ?HH Arranged: NA ?[place] Agency: NA ?  ?  ?  ? ?Prior Living Arrangements/Services ?Living arrangements for the past 2 months: [place] ?Lives with:: Adult Children ?Patient language and need for interpreter reviewed:: Yes ?Do you feel safe going back to the place where you live?: No   family can't provide round clock care at home  ?Need for Family Participation in Patient Care: Yes (Comment) ?Care giver support  system in place?: Yes (comment) (son and daughter in law) ?Current home services: DME (2 wheel walker and 4 wheel walker, wheelchair) ?Criminal Activity/Legal Involvement Pertinent to Current Situation/Hospitalization: No - Comment as needed ? ?Activities of Daily Living ?Home Assistive Devices/Equipment: [place] Rile (specify type) ?ADL Screening (condition at time of admission) ?Patient's cognitive ability adequate to safely complete daily activities?: Yes ?Is the patient deaf or have difficulty hearing?: No ?Does the patient have difficulty seeing, even when wearing glasses/contacts?: No ?Does the patient have difficulty concentrating, remembering, or making decisions?: No ?Patient able to express need for assistance with ADLs?: Yes ?Does the patient have difficulty dressing or bathing?: Yes ?Independently performs ADLs?: No ?Communication: Independent ?Dressing (OT): Needs assistance ?Is this a change from baseline?: Pre-admission baseline ?Grooming: Needs assistance ?Is this a change from baseline?: Pre-admission baseline ?Feeding: Independent ?Bathing: Needs assistance ?Is this a change from baseline?: Pre-admission baseline ?Toileting: Needs assistance ?Is this a change from baseline?: Pre-admission baseline ?In/Out Bed: Needs assistance ?Is this a change from baseline?: Pre-admission baseline ?Walks in Home: Independent with device (comment) ?Does the patient have difficulty walking or climbing stairs?: Yes ?Weakness of Legs: Both ?Weakness of Arms/Hands: None ? ?Permission Sought/Granted ?Permission sought to share information with : Case Freight forwarder, Customer service manager ?Permission granted to share information with : Yes, Verbal Permission Granted ? Share Information with NAME: [name] ? Permission granted to share info w AGENCY: [place] ?  Permission granted to share info w Relationship: son ? Permission granted to share info w Contact Information: [phone] ? ?Emotional Assessment ?  ?  ?   ?Orientation: : Oriented to Self, Oriented to Place, Oriented to  Time, Oriented to Situation ?Alcohol / Substance Use: Not Applicable ?Psych Involvement: No (comment) ? ?Admission diagnosis:  Weakness [R53.1] ?Acute on chronic systolic (congestive) heart failure ([place]) [I50.23] ?Acute on chronic systolic CHF (congestive heart failure) ([place]) [I50.23] ?Patient Active Problem List  ? Diagnosis Date Noted  ? Acute on chronic systolic (congestive) heart failure ([place]) 10/23/2021  ? Anemia of chronic kidney failure, stage 5 ([place]) 10/22/2021  ? Chronic anticoagulation 10/22/2021  ? Dysphagia 10/22/2021  ? Cough, possible aspiration 10/22/2021  ? Altered mental status 10/22/2021  ? Insulin dependent type 2 diabetes mellitus ([place]) 10/22/2021  ? Generalized weakness 10/22/2021  ? Thrombocytopenia ([place]) 10/22/2021  ? Malnutrition of moderate degree 10/17/2021  ? Major depressive disorder, recurrent episode, moderate ([place]) 10/13/2021  ? End-stage renal disease on hemodialysis ([place]) 10/12/2021  ? Dyslipidemia 10/12/2021  ? Essential hypertension 10/12/2021  ? Acute on chronic systolic CHF (congestive heart failure) ([place]) 10/12/2021  ? Myasthenia gravis with possible (acute) exacerbation ([place]) 10/11/2021  ? Pain of right upper extremity 10/11/2021  ? ?PCP:  Juluis Pitch, MD ?Pharmacy:  No Pharmacies Listed ? ? ? ?Social Determinants of Health (SDOH) Interventions ?  ? ?Readmission Risk Interventions ?   ? View : No data to display.  ?  ?  ?  ? ? ? ?

## 2021-10-23 NOTE — ED Notes (Addendum)
Pt appears to be having wheezing with expiration with increased respirations. Dr. Otilio Miu notified to see if pt is able to have something for her breathing. ?

## 2021-10-23 NOTE — ED Notes (Addendum)
Pt refusing blood work at this time. Instructed pt for importance of blood work. Pt still refuses and begins yelling out at this time. This RN called pt son Tammie Klein and daughter in law Tammie Klein to see best plan of care for pt. Due to pt being frustrated at this time, family wants to wait until they get here to try again for morning labs. Tammie Klein states she will be here at 7am to visit to try labs again at that time.  ?

## 2021-10-23 NOTE — Progress Notes (Signed)
?   10/23/21 1500  ?Clinical Encounter Type  ?Visited With Patient and family together  ?Visit Type Follow-up;ED  ? ?Chaplain followed up with patient and family previously seen in hospital ?

## 2021-10-23 NOTE — Progress Notes (Signed)
Manufacturing engineer [facility]) Hospital Liaison Note ? ?Received request from Transitions of Care Manager Pryor Montes for family interest in [facility]. Visited patient at bedside and spoke with son/Dila to confirm interest and explain services. ? ?Approval for Hospice Home is determined by [facility] MD. Once [facility] MD has determined Hospice Home eligibility, [place] will update hospital staff and family. Eligibility is pending ? ?Please do not hesitate to call with any hospice related questions.  ?  ?Thank you for the opportunity to participate in this patient's care. ? ?Daphene Calamity, MSW ?[place]  ?[phone] ? ?

## 2021-10-23 NOTE — Progress Notes (Signed)
?  [facility]  ?ROUNDING NOTE  ? ?Subjective:  ? ?Tammie Klein is a 71 year old female with past medical histories including chronic heart failure, diabetes, hypertension, myasthenia gravis, and end-stage renal disease on hemodialysis.  Patient presents to the emergency department with weakness after dialysis.  Patient has been admitted for Weakness [R53.1] ?Acute on chronic systolic (congestive) heart failure ([place]) [I50.23] ? ?Patient recently started receiving outpatient dialysis treatments at [facility], on a Monday Wednesday Friday schedule. Reports states that she went to her outpatient dialysis clinic and refused dialysis. Her son was called and they were able to convince patient to proceed with dialysis. She completed her outpatient treatment and complained of weakness and the inability to walk afterwards. Patient now in ED with sone and daughter in law at bedside. They state patient has decided she does not want to continue with any treatments or therapies. She has requested to proceed with hospice care. Primary team is currently consulting hospice care.  ? ?Assessment/ Plan:  ?Tammie Klein is a 71 y.o.  female with past medical histories including chronic heart failure, diabetes, hypertension, myasthenia gravis, and end-stage renal disease on hemodialysis.  Patient presents to the emergency department with weakness after dialysis.  Patient has been admitted for Weakness [R53.1] ?Acute on chronic systolic (congestive) heart failure ([place]) [I50.23] ? ?[facility]/MWF/left aVF ? ?End-stage renal disease on hemodialysis.  Last treatment received on 10/22/21. Patient has decided to stop treatments and pursue hospice.  ? ? ?2. Anemia of chronic kidney disease ?Lab Results  ?Component Value Date  ? HGB 8.0 (L) 10/23/2021  ?Mircera received outpatient biweekly.   ? ? ?3. Secondary Hyperparathyroidism:  ?Lab Results  ?Component Value Date  ? CALCIUM 9.0 10/23/2021  ? PHOS 4.5  10/14/2021  ? ? ?4.  Hypertension with chronic kidney disease.  Home regimen includes carvedilol, furosemide, isosorbide-hydralazine, and Entresto.  ? ?Patient has decided to discontinue hemodialysis treatments and pursue comfort measures with hospice. Will will honor her wishes and not offer further treatments.  ? ? LOS: 1 ?[place] ?5/2/20231:03 PM ?  ?

## 2021-10-23 NOTE — Progress Notes (Signed)
? ? ? ?Progress Note  ? ? ?Tammie Klein  HAL:[id number] DOB: 09/16/1950  DOA: 10/22/2021 ?PCP: Juluis Pitch, MD  ? ? ? ? ?Brief Narrative:  ? ? ?Medical records reviewed and are as summarized below: ? ?Tammie Klein is a 71 y.o. female with medical history significant for Myasthenia gravis s/p thymectomy and on prednisone, ESRD on HD MWF with chronic anemia, history of VTE on Eliquis, HTN, systolic CHF with last EF 25 to 30% in March 2023, major depressive disorder, recently relocated from [place] a month ago to live with her son here in [place] following a fall and for concerns for continued independent living, and who was recently hospitalized here from 4/22- 10/19/21 with exacerbation of myasthenia gravis, treated with prednisone (IVIG deferred due to improving symptoms).  She was sent from the outpatient hemodialysis unit to the hospital because of altered mental status, lethargy and generalized weakness.  She also complained of shortness of breath.  She had a congested cough and family was concerned about difficulty swallowing. ? ? ?She was admitted to the hospital for acute on chronic systolic CHF, acute metabolic encephalopathy, generalized weakness possibly from exacerbation of myasthenia gravis.  She was treated with IV Lasix and IV steroids.  Patient decided that she did not want to continue with dialysis anymore and requested comfort measures.   ? ? ? ? ? ?Assessment/Plan:  ? ?Active Problems: ?  Cough, possible aspiration ?  Altered mental status ?  Generalized weakness ?  Myasthenia gravis with possible (acute) exacerbation (HCC) ?  Acute on chronic systolic CHF (congestive heart failure) ([place]) ?  Dysphagia ?  Chronic anticoagulation ?  Major depressive disorder, recurrent episode, moderate ([place]) ?  End-stage renal disease on hemodialysis ([place]) ?  Essential hypertension ?  Anemia of chronic kidney failure, stage 5 (HCC) ?  Insulin dependent type 2 diabetes mellitus ([place]) ?  Thrombocytopenia  ([place]) ? ? ? ?Body mass index is 21.26 kg/m?. ? ? ?Acute on chronic systolic CHF ?Myasthenia gravis with possible acute exacerbation ?ESRD on hemodialysis ?Anemia of chronic disease ?Hypertension ?Chronic thrombocytopenia ?Depression ?Insulin-dependent type 2 diabetes mellitus ?Dysphagia ?Generalized weakness ?History of DVT on Eliquis ? ?PLAN ? ? ?Patient decided to discontinue all treatment including hemodialysis.  She requested comfort measures.  She understands that her condition will decline rapidly with discontinuation of treatment and hemodialysis which may result in death. Her son, Wandra Arthurs, and daughter-in-law were at the bedside and they both agreed with their decision. ?Patient will be transition to comfort care. ?Consulted hospice team to assist with disposition. ? ? ? ?Diet Order   ? ? None  ? ?  ? ? ? ? ? ? ? ? ? ? ? ? ? ? ?Medications:  ? ? ?Continuous Infusions: ? ? ?Anti-infectives (From admission, onward)  ? ? None  ? ?  ? ? ? ? ? ? ? ? ? ?Family Communication/Anticipated D/C date and plan/Code Status  ? ?DVT prophylaxis:  ? ? ?  Code Status: DNR ? ?Family Communication: Plan discussed with [place], son, and daughter-in-law at the bedside ?Disposition Plan: Plan to discharge to hospice house ? ? ?Status is: Inpatient ?Remains inpatient appropriate because: Comfort measures ? ? ? ? ? ? ?Subjective:  ? ?Interval events noted.  She complains of generalized weakness, fatigue, congested cough and shortness of breath.  She said she was tired and did not want to continue with treatment including hemodialysis.  She wanted to focus on comfort measures.  Her son  and daughter-in-law were at the bedside. ? ?Objective:  ? ? ?Vitals:  ? 10/23/21 0630 10/23/21 0800 10/23/21 0830 10/23/21 0837  ?BP: (!) 148/69 129/65 (!) 141/67   ?Pulse: 88 93 87   ?Resp: (!) 31   (!) 23  ?Temp:      ?TempSrc:      ?SpO2: 98% 99% 100%   ?Weight:      ?Height:      ? ?No data found. ? ?No intake or output data in the 24 hours ending  10/23/21 0925 ?Filed Weights  ? 10/22/21 1734  ?Weight: 54.4 kg  ? ? ?Exam: ? ?GEN: NAD ?SKIN: Warm and dry ?EYES: No pallor or icterus ?ENT: MMM ?CV: RRR ?PULM: Bibasilar rales, no wheezing ?ABD: soft, ND, NT, +BS ?CNS: AAO x 3, non focal ?EXT: No edema or tenderness ? ? ? ?  ? ? ?Data Reviewed:  ? ?I have personally reviewed following labs and imaging studies: ? ?Labs: ?Labs show the following:  ? ?Basic Metabolic Panel: ?Recent Labs  ?Lab 10/17/21 ?0093 10/18/21 ?8182 10/19/21 ?0654 10/22/21 ?1738 10/23/21 ?0406  ?NA 133* 133* 129* 136 137  ?K 3.3* 3.1* 3.4* 4.5 4.9  ?CL 98 96* 92* 97* 97*  ?CO2 28 30 28 27 23   ?GLUCOSE 103* 102* 89 121* 123*  ?BUN 26* 15 24* 16 26*  ?CREATININE 4.13* 2.65* 4.21* 3.04* 4.01*  ?CALCIUM 8.0* 8.4* 8.6* 8.8* 9.0  ?MG 1.7 1.7 1.8  --   --   ? ?GFR ?Estimated Creatinine Clearance: 11.1 mL/min (A) (by C-G formula based on SCr of 4.01 mg/dL (H)). ?Liver Function Tests: ?Recent Labs  ?Lab 10/22/21 ?1738  ?AST 36  ?ALT 41  ?ALKPHOS 68  ?BILITOT 1.3*  ?PROT 7.4  ?ALBUMIN 3.3*  ? ?No results for input(s): LIPASE, AMYLASE in the last 168 hours. ?No results for input(s): AMMONIA in the last 168 hours. ?Coagulation profile ?Recent Labs  ?Lab 10/22/21 ?1738  ?INR 1.6*  ? ? ?CBC: ?Recent Labs  ?Lab 10/17/21 ?9937 10/18/21 ?1696 10/19/21 ?0654 10/22/21 ?1738 10/23/21 ?0406  ?WBC 4.7 5.2 4.0 5.9 6.6  ?NEUTROABS  --   --   --  5.1  --   ?HGB 7.6* 7.7* 7.2* 8.7* 8.0*  ?HCT 23.4* 23.4* 22.3* 28.2* 25.6*  ?MCV 95.1 95.1 96.5 99.6 100.0  ?PLT 58* 57* 55* 59* 47*  ? ?Cardiac Enzymes: ?No results for input(s): CKTOTAL, CKMB, CKMBINDEX, TROPONINI in the last 168 hours. ?BNP (last 3 results) ?No results for input(s): PROBNP in the last 8760 hours. ?CBG: ?Recent Labs  ?Lab 10/19/21 ?7893 10/19/21 ?1625 10/22/21 ?2323 10/23/21 ?8101 10/23/21 ?0731  ?GLUCAP 90 178* 120* 118* 89  ? ?D-Dimer: ?No results for input(s): DDIMER in the last 72 hours. ?Hgb A1c: ?No results for input(s): HGBA1C in the last 72  hours. ?Lipid Profile: ?No results for input(s): CHOL, HDL, LDLCALC, TRIG, CHOLHDL, LDLDIRECT in the last 72 hours. ?Thyroid function studies: ?No results for input(s): TSH, T4TOTAL, T3FREE, THYROIDAB in the last 72 hours. ? ?Invalid input(s): FREET3 ?Anemia work up: ?No results for input(s): VITAMINB12, FOLATE, FERRITIN, TIBC, IRON, RETICCTPCT in the last 72 hours. ?Sepsis Labs: ?Recent Labs  ?Lab 10/18/21 ?7510 10/19/21 ?0654 10/22/21 ?1738 10/23/21 ?0406  ?WBC 5.2 4.0 5.9 6.6  ? ? ?Microbiology ?Recent Results (from the past 240 hour(s))  ?Resp Panel by RT-PCR (Flu A&B, Covid) Nasopharyngeal Swab     Status: None  ? Collection Time: 10/22/21  5:37 PM  ? Specimen: Nasopharyngeal Swab; Nasopharyngeal(NP) swabs  in vial transport medium  ?Result Value Ref Range Status  ? SARS Coronavirus 2 by RT PCR NEGATIVE NEGATIVE Final  ?  Comment: (NOTE) ?SARS-CoV-2 target nucleic acids are NOT DETECTED. ? ?The SARS-CoV-2 RNA is generally detectable in upper respiratory ?specimens during the acute phase of infection. The lowest ?concentration of SARS-CoV-2 viral copies this assay can detect is ?138 copies/mL. A negative result does not preclude SARS-Cov-2 ?infection and should not be used as the sole basis for treatment or ?other patient management decisions. A negative result may occur with  ?improper specimen collection/handling, submission of specimen other ?than nasopharyngeal swab, presence of viral mutation(s) within the ?areas targeted by this assay, and inadequate number of viral ?copies(<138 copies/mL). A negative result must be combined with ?clinical observations, patient history, and epidemiological ?information. The expected result is Negative. ? ?Fact Sheet for Patients:  ?EntrepreneurPulse.com.au ? ?Fact Sheet for Healthcare Providers:  ?IncredibleEmployment.be ? ?This test is no t yet approved or cleared by the [place] FDA and  ?has been authorized for detection and/or  diagnosis of SARS-CoV-2 by ?FDA under an Emergency Use Authorization (EUA). This EUA will remain  ?in effect (meaning this test can be used) for the duration of the ?COVID-19 declaration under Section 564(b)(1) of the A

## 2021-10-24 DIAGNOSIS — Z515 Encounter for palliative care: Secondary | ICD-10-CM

## 2021-10-24 MED ORDER — GLYCOPYRROLATE 0.2 MG/ML IJ SOLN: 0.2000 mg | INTRAMUSCULAR | Status: AC | PRN

## 2021-10-24 MED ORDER — LORAZEPAM 2 MG/ML IJ SOLN: 1.0000 mg | INTRAMUSCULAR | 0 refills | Status: AC | PRN

## 2021-10-24 MED ORDER — HYDROMORPHONE HCL 1 MG/ML IJ SOLN: 0.5000 mg | INTRAMUSCULAR | 0 refills | Status: AC | PRN

## 2021-10-24 NOTE — Plan of Care (Signed)
Pt is alert but not very communicative.  States she's not in pain but is a little restless.  Refuses repeated offers for something to eat or drink. Will continue to monitor for comfort needs.  ?

## 2021-10-24 NOTE — Progress Notes (Signed)
Nutrition Brief Note ? ?Chart reviewed. ?Pt now transitioning to comfort care. Per MD notes, family is interested in residential hospice placement.  ?No further nutrition interventions planned at this time.  ?Please re-consult as needed.  ? ?Loistine Chance, RD, LDN, CDCES ?Registered Dietitian II ?Certified Diabetes Care and Education Specialist ?Please refer to [facility] for RD and/or RD on-call/weekend/after hours pager   ?

## 2021-10-24 NOTE — Progress Notes (Signed)
[facility]  ? ?Consent forms to be completed by son/Dila. ? ?EMS notified of patient D/C and transport arranged for 12:30p pickup. TOC/Elena and Attending Physician/Dr. Priscella Mann also notified of transport arrangement.  ?  ?Please send signed DNR form with patient and RN call report to [phone].  ?  ?Daphene Calamity, MSW ?[place] ?[phone] ? ?

## 2021-10-24 NOTE — TOC Progression Note (Signed)
Transition of Care (TOC) - Progression Note  ? ? ?Patient Details  ?Name: Tammie Klein ?MRN: [id number] ?Date of Birth: Apr 28, 1951 ? ?Transition of Care (TOC) CM/SW Contact  ?Pete Pelt, RN ?Phone Number: ?10/24/2021, 9:45 AM ? ?Clinical Narrative:   Discharge to hospice house today as per [facility].  [name] has contacted family and EMS for transport. ? ? ? ?Expected Discharge Plan: [place] ?Barriers to Discharge: Other (must enter comment) (referral given to hospice- [facility]) ? ?Expected Discharge Plan and Services ?Expected Discharge Plan: [place] ?  ?Discharge Planning Services: CM Consult ?Post Acute Care Choice: Hospice ?Living arrangements for the past 2 months: [place] ?                ?DME Arranged: N/A ?DME Agency: NA ?  ?  ?  ?HH Arranged: NA ?[place] Agency: NA ?  ?  ?  ? ? ?Social Determinants of Health (SDOH) Interventions ?  ? ?Readmission Risk Interventions ? ?  10/24/2021  ?  9:43 AM  ?Readmission Risk Prevention Plan  ?Transportation Screening Complete  ?PCP or Specialist Appt within 5-7 Days Complete  ?Home Care Screening Complete  ?Medication Review (RN CM) Complete  ? ? ?

## 2021-10-24 NOTE — Discharge Summary (Signed)
Physician Discharge Summary  ?Tammie Klein DOB: 12/25/1950 DOA: 10/22/2021 ? ?PCP: Juluis Pitch, MD ? ?Admit date: 10/22/2021 ?Discharge date: 10/24/2021 ? ?Admitted From: Home ?Disposition:  Hospice Facility ? ?Recommendations for Outpatient Follow-up:  ?Per hospice providers ? ? ?Home Health:NA  ?Equipment/Devices:None  ? ?Discharge Condition:Hospice/Comfort  ?CODE STATUS:DNR  ?Diet recommendation: Regular/comfort ? ?Brief/Interim Summary: ?71 y.o. female with medical history significant for Myasthenia gravis s/p thymectomy and on prednisone, ESRD on HD MWF with chronic anemia, history of VTE on Eliquis, HTN, systolic CHF with last EF 25 to 30% in March 2023, major depressive disorder, recently relocated from [place] a month ago to live with her son here in [place] following a fall and for concerns for continued independent living, and who was recently hospitalized here from 4/22- 10/19/21 with exacerbation of myasthenia gravis, treated with prednisone (IVIG deferred due to improving symptoms).  She was sent from the outpatient hemodialysis unit to the hospital because of altered mental status, lethargy and generalized weakness.  She also complained of shortness of breath.  She had a congested cough and family was concerned about difficulty swallowing. ?  ?  ?She was admitted to the hospital for acute on chronic systolic CHF, acute metabolic encephalopathy, generalized weakness possibly from exacerbation of myasthenia gravis.  She was treated with IV Lasix and IV steroids.  Patient decided that she did not want to continue with dialysis anymore and requested comfort measures.   ? ?Patient decided to discontinue all treatment including hemodialysis.  She requested comfort measures.  She understands that her condition will decline rapidly with discontinuation of treatment and hemodialysis which may result in death. Her son, Tammie Klein, and daughter-in-law were at the bedside and they both agreed with  their decision. ? ?Patient accepted to hospice home.  Consents signed ? ? ? ?Discharge Diagnoses:  ?Principal Problem: ?  Acute on chronic systolic (congestive) heart failure (HCC) ?Active Problems: ?  Cough, possible aspiration ?  Altered mental status ?  Generalized weakness ?  Myasthenia gravis with possible (acute) exacerbation (HCC) ?  Acute on chronic systolic CHF (congestive heart failure) ([place]) ?  Dysphagia ?  Chronic anticoagulation ?  Major depressive disorder, recurrent episode, moderate ([place]) ?  End-stage renal disease on hemodialysis ([place]) ?  Essential hypertension ?  Anemia of chronic kidney failure, stage 5 (HCC) ?  Insulin dependent type 2 diabetes mellitus ([place]) ?  Thrombocytopenia ([place]) ?  Hospice care patient ?  Palliative care patient ? ?All medications transitioned to full comfort measures.  DNR status.  DC to hospice home. ? ?Discharge Instructions ? ?Discharge Instructions   ? ? Diet - low sodium heart healthy   Complete by: As directed ?  ? Increase activity slowly   Complete by: As directed ?  ? ?  ? ?Allergies as of 10/24/2021   ? ?   Reactions  ? Ciprofloxacin Other (See Comments), Nausea And Vomiting  ? Muscle aches ?Muscle aches ?Muscle aches  ? Buprenorphine Hcl Nausea And Vomiting  ? Solifenacin Itching, Other (See Comments)  ? Other reaction(s): Other ?Other reaction(s): Other ?Other reaction(s): Other  ? ?  ? ?  ?Medication List  ?  ? ?STOP taking these medications   ? ?apixaban 5 MG Tabs tablet ?Commonly known as: ELIQUIS ?  ?atorvastatin 20 MG tablet ?Commonly known as: LIPITOR ?  ?carvedilol 25 MG tablet ?Commonly known as: COREG ?  ?famotidine 20 MG tablet ?Commonly known as: PEPCID ?  ?furosemide 80 MG tablet ?Commonly known  as: LASIX ?  ?insulin degludec 200 UNIT/ML FlexTouch Pen ?Commonly known as: TRESIBA ?  ?isosorbide-hydrALAZINE 20-37.5 MG tablet ?Commonly known as: BIDIL ?  ?lidocaine 5 % ?Commonly known as: LIDODERM ?  ?melatonin 3 MG Tabs tablet ?  ?mirtazapine 7.5 MG  tablet ?Commonly known as: REMERON ?  ?nitroGLYCERIN 0.4 MG SL tablet ?Commonly known as: NITROSTAT ?  ?ondansetron 4 MG tablet ?Commonly known as: ZOFRAN ?  ?pentoxifylline 400 MG CR tablet ?Commonly known as: TRENTAL ?  ?predniSONE 5 MG tablet ?Commonly known as: DELTASONE ?  ?sacubitril-valsartan 24-26 MG ?Commonly known as: ENTRESTO ?  ?senna-docusate 8.6-50 MG tablet ?Commonly known as: Senokot-S ?  ? ?  ? ?TAKE these medications   ? ?bimatoprost 0.01 % Soln ?Commonly known as: LUMIGAN ?Place 1 drop into both eyes at bedtime. ?  ?glycopyrrolate 0.2 MG/ML injection ?Commonly known as: ROBINUL ?Inject 1 mL (0.2 mg total) into the vein every 4 (four) hours as needed (secretions). ?  ?HYDROmorphone 1 MG/ML injection ?Commonly known as: DILAUDID ?Inject 0.5 mLs (0.5 mg total) into the vein every 2 (two) hours as needed for severe pain. ?  ?LORazepam 2 MG/ML injection ?Commonly known as: ATIVAN ?Inject 0.5 mLs (1 mg total) into the vein every 4 (four) hours as needed for anxiety. ?  ? ?  ? ? ?Allergies  ?Allergen Reactions  ? Ciprofloxacin Other (See Comments) and Nausea And Vomiting  ?  Muscle aches ?Muscle aches ?Muscle aches ?  ? Buprenorphine Hcl Nausea And Vomiting  ? Solifenacin Itching and Other (See Comments)  ?  Other reaction(s): Other ?Other reaction(s): Other ?Other reaction(s): Other ?  ? ? ?Consultations: ?Nephrology ? ? ?Procedures/Studies: ?CT HEAD WO CONTRAST (5MM) ? ?Result Date: 10/22/2021 ?CLINICAL DATA:  Weakness, somnolence EXAM: CT HEAD WITHOUT CONTRAST TECHNIQUE: Contiguous axial images were obtained from the base of the skull through the vertex without intravenous contrast. RADIATION DOSE REDUCTION: This exam was performed according to the departmental dose-optimization program which includes automated exposure control, adjustment of the mA and/or kV according to patient size and/or use of iterative reconstruction technique. COMPARISON:  None. FINDINGS: Brain: No evidence of acute infarction,  hemorrhage, hydrocephalus, extra-axial collection or mass lesion/mass effect. Patchy low-density changes within the periventricular and subcortical white matter compatible with chronic microvascular ischemic change. Mild diffuse cerebral volume loss. Vascular: Atherosclerotic calcifications involving the large vessels of the skull base. No unexpected hyperdense vessel. Skull: Normal. Negative for fracture or focal lesion. Sinuses/Orbits: No acute finding. Other: None. IMPRESSION: 1. No acute intracranial abnormality. 2. Chronic microvascular ischemic change and cerebral volume loss. Electronically Signed   By: Davina Poke D.O.   On: 10/22/2021 19:38  ? ?CT CHEST ABDOMEN PELVIS W CONTRAST ? ?Result Date: 10/18/2021 ?CLINICAL DATA:  Lower sternal mass, myasthenia gravis status post thymectomy EXAM: CT CHEST, ABDOMEN, AND PELVIS WITH CONTRAST TECHNIQUE: Multidetector CT imaging of the chest, abdomen and pelvis was performed following the standard protocol during bolus administration of intravenous contrast. RADIATION DOSE REDUCTION: This exam was performed according to the departmental dose-optimization program which includes automated exposure control, adjustment of the mA and/or kV according to patient size and/or use of iterative reconstruction technique. CONTRAST:  84mL OMNIPAQUE IOHEXOL 300 MG/ML  SOLN COMPARISON:  None. FINDINGS: CT CHEST FINDINGS Cardiovascular: Extensive multi-vessel coronary artery calcification. Global cardiac size is mildly enlarged. No pericardial effusion. Central pulmonary arteries are of normal caliber. Extensive atherosclerotic calcification noted within the thoracic aorta. No aortic aneurysm. Particularly prominent atherosclerotic calcification noted at the  origin of the right vertebral artery Mediastinum/Nodes: Multinodular thyroid goiter identified with the dominant nodule within the right lobe measuring 2.7 cm and within the left lobe measuring 2.3 cm. Infiltration within the  anterior mediastinum does not expand the borders of the anterior mediastinum and may represent postsurgical change following thymectomy. The esophagus is unremarkable. No pathologic thoracic adenopathy. Orlene Plum

## 2021-11-22 DEATH — deceased

## 2022-01-16 ENCOUNTER — Ambulatory Visit: Payer: Self-pay | Admitting: Physician Assistant

## 2023-04-29 IMAGING — DX DG CHEST 1V PORT
1 series · 1 of 1 positions shown · non-contrast
Comparison: None.

CLINICAL DATA: Dyspnea

EXAM:
PORTABLE CHEST 1 VIEW

[chest ap]
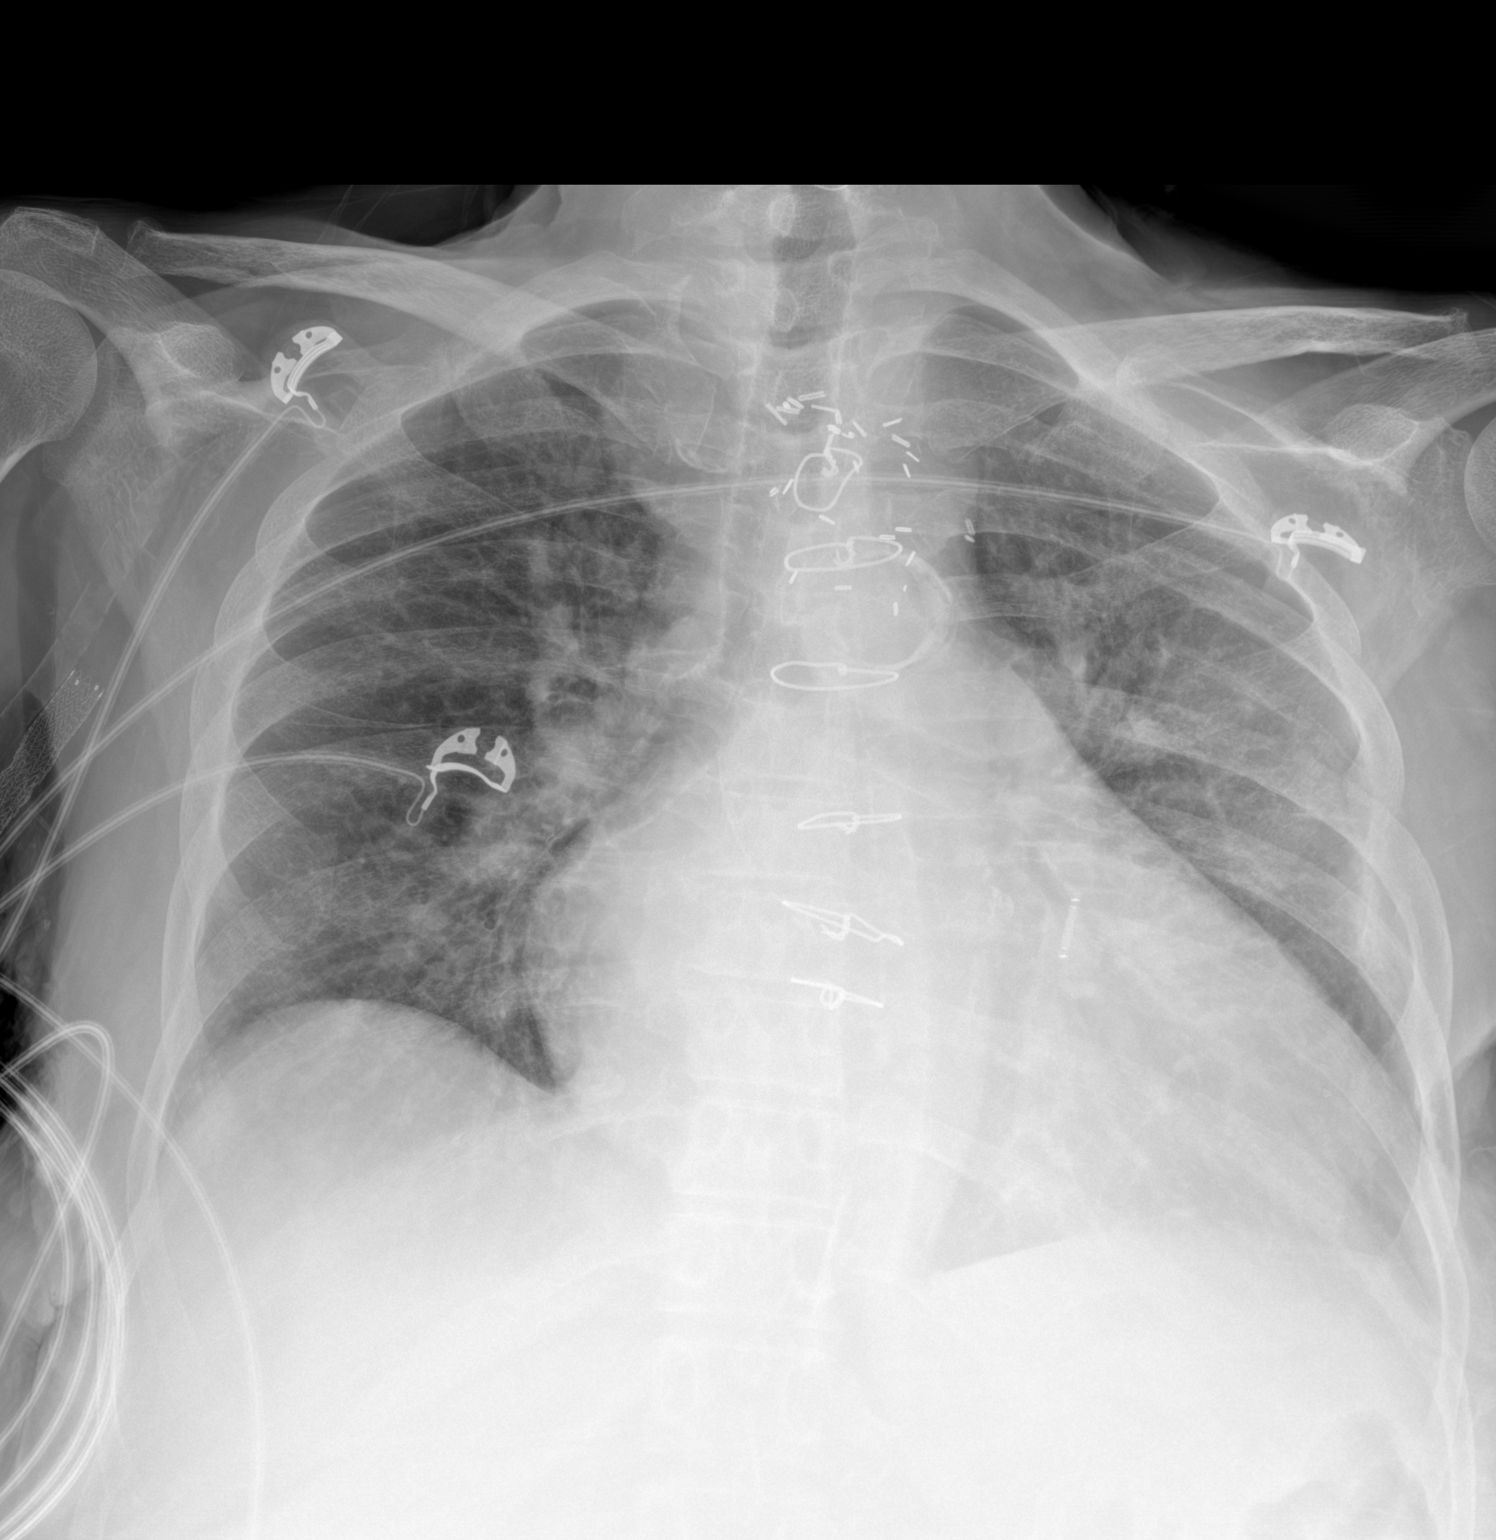

[1 of 1 positions shown; findings below may reference images not displayed]

FINDINGS: The lungs are symmetrically expanded. Mild interstitial thickening
is likely chronic in nature. No superimposed focal pulmonary
infiltrate. No pneumothorax or pleural effusion. Median sternotomy
has been performed. Intra pulmonary pressure monitoring device noted
within the left perihilar region. Mild cardiomegaly is present.
Pulmonary vascularity is normal. No acute bone abnormality.
IMPRESSION: No radiographic evidence of acute cardiopulmonary disease.

Mild cardiomegaly.

## 2023-05-06 IMAGING — CT CT CHEST-ABD-PELV W/ CM
2 of 4 series · 13 of 36 positions shown, 15 images · IV contrast (agent unspecified)
Comparison: None.

CLINICAL DATA: Lower sternal mass, myasthenia gravis status post
thymectomy

EXAM:
CT CHEST, ABDOMEN, AND PELVIS WITH CONTRAST
TECHNIQUE: Multidetector CT imaging of the chest, abdomen and pelvis was
performed following the standard protocol during bolus
administration of intravenous contrast.

[Series 2: cap with · axial · 0.76mm/px · z∈[-1232,-752]mm · 10 of 116 slices shown, 12 images]
[im 10/116  mediastinal]
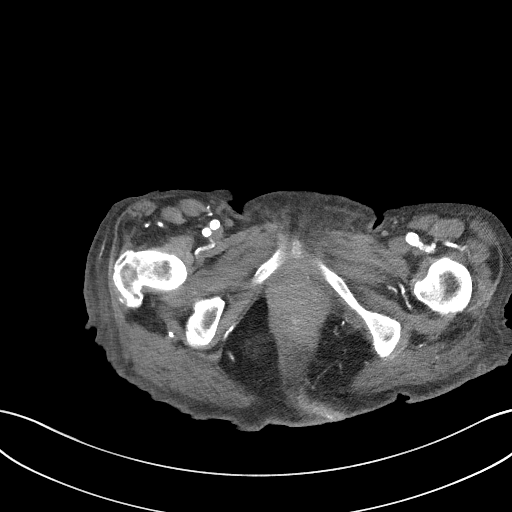
[im 10/116  bone]
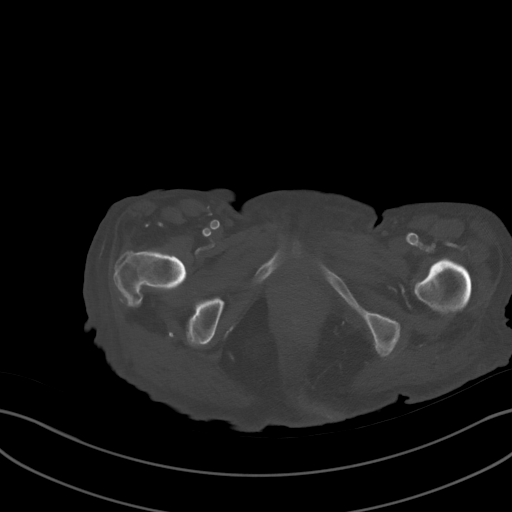
[im 20/116  mediastinal]
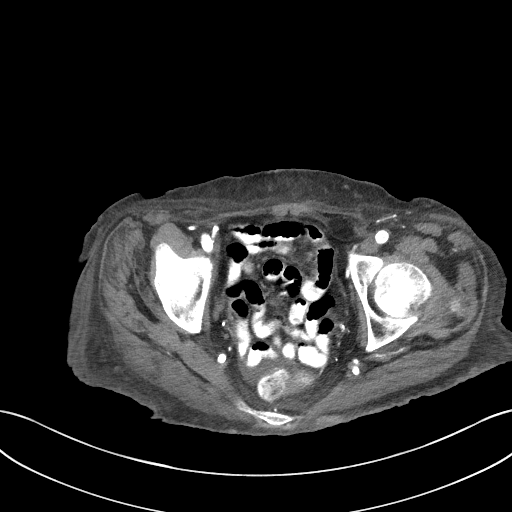
[im 29/116  mediastinal]
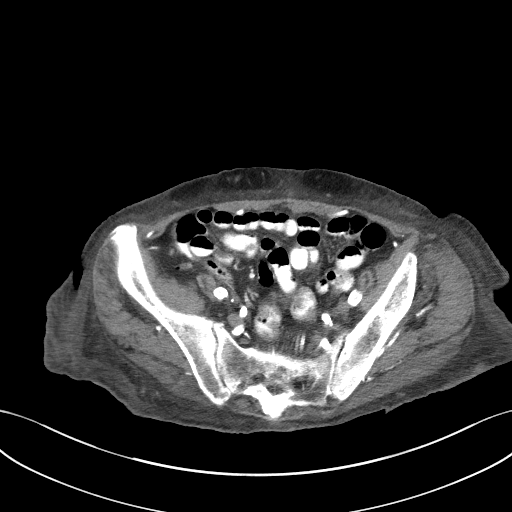
[im 39/116  mediastinal]
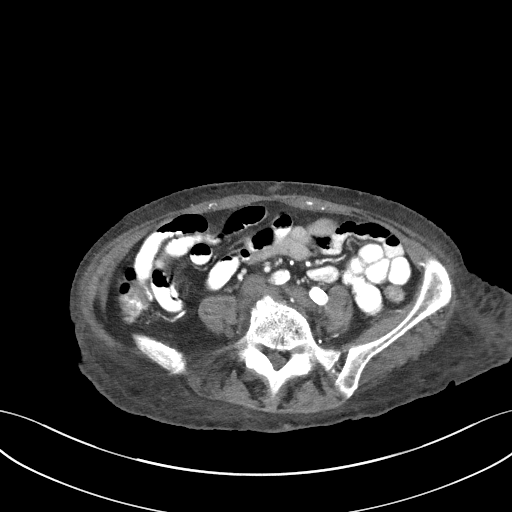
[im 48/116  mediastinal]
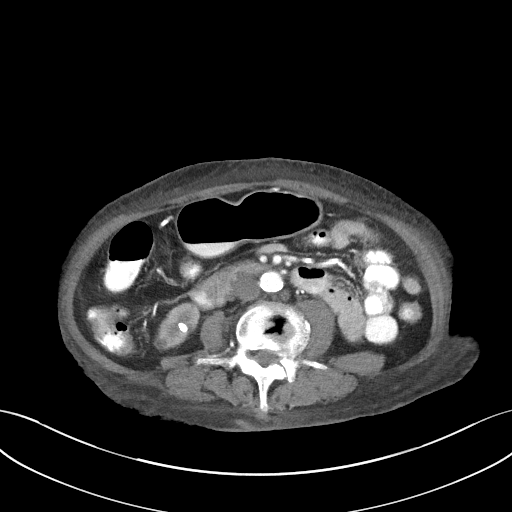
[im 68/116  mediastinal]
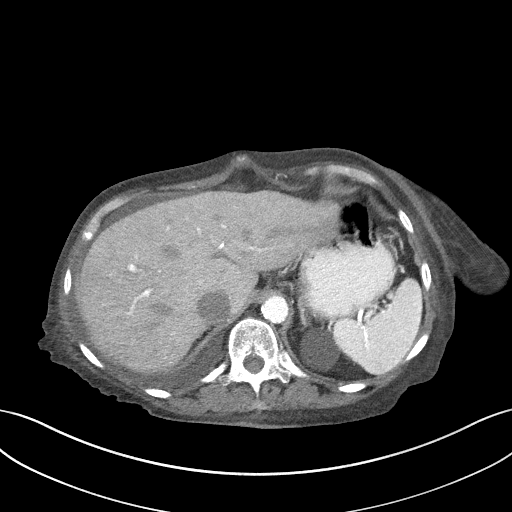
[im 77/116  mediastinal]
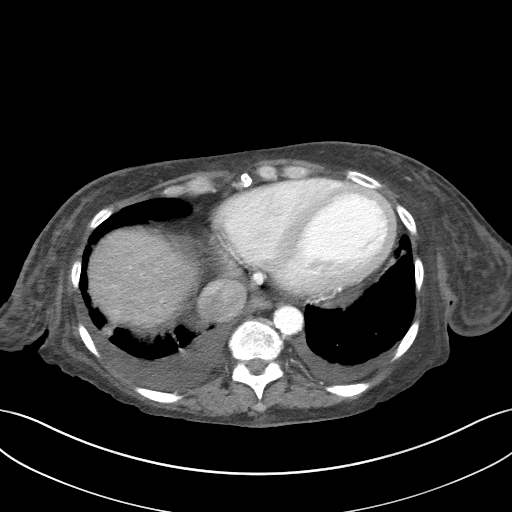
[im 87/116  mediastinal]
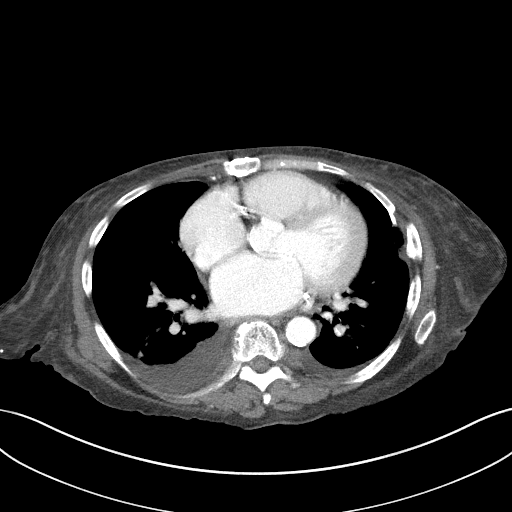
[im 96/116  mediastinal]
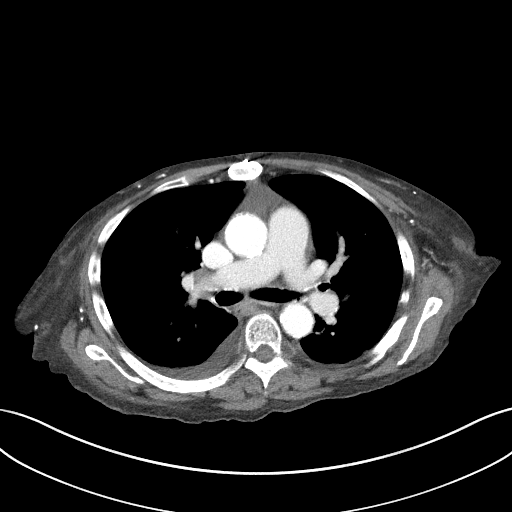
[im 96/116  bone]
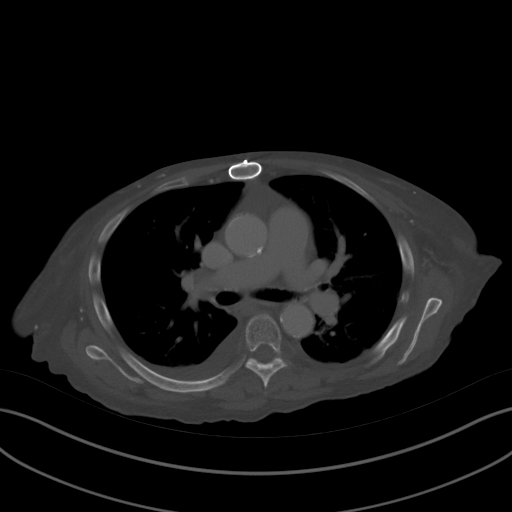
[im 106/116  mediastinal]
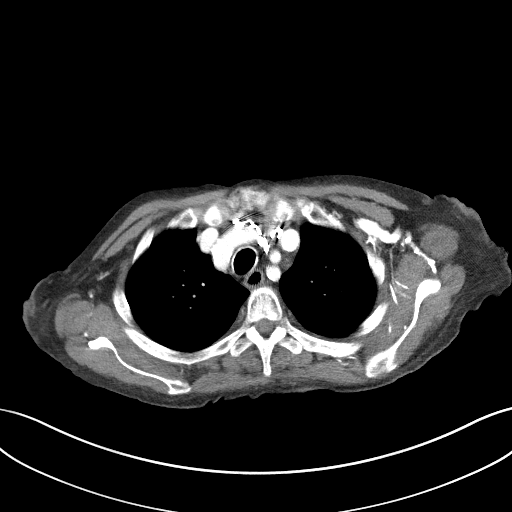

[Series 5: coronals · coronal · 0.73mm/px · 3 of 132 slices shown]
[im 27/132  mediastinal]
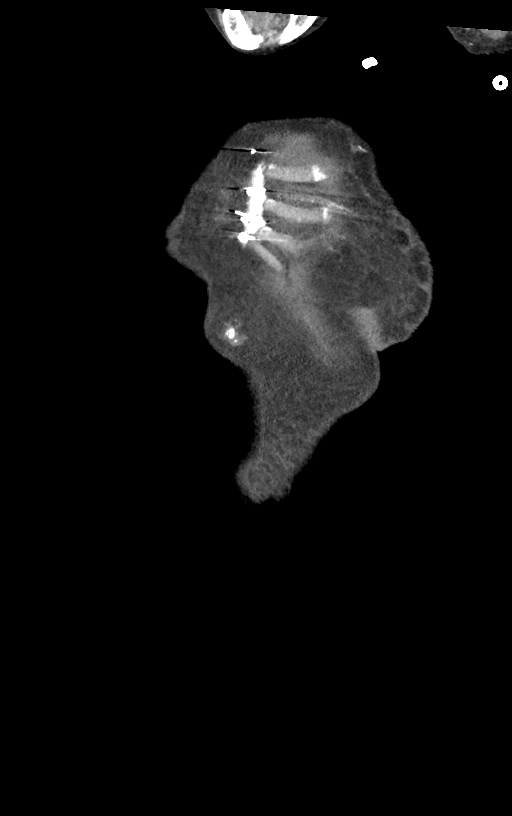
[im 53/132  mediastinal]
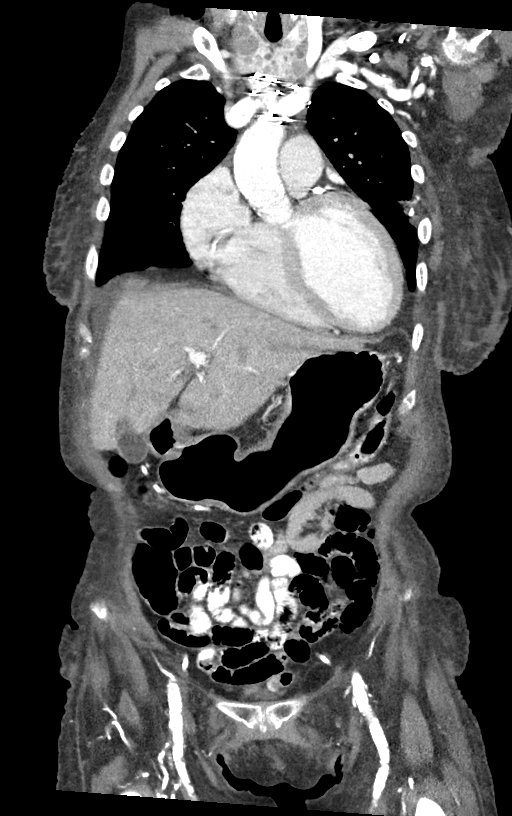
[im 79/132  mediastinal]
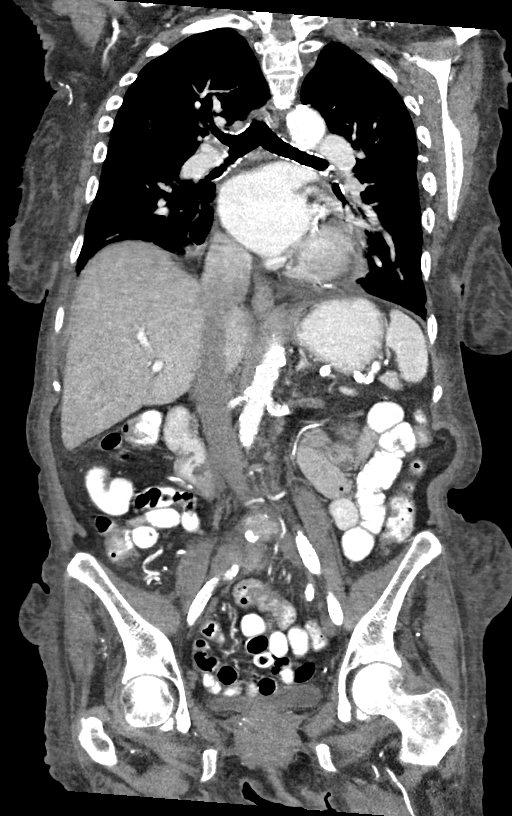

[13 of 36 positions shown; findings below may reference images not displayed]

RADIATION DOSE REDUCTION: This exam was performed according to the
departmental dose-optimization program which includes automated
exposure control, adjustment of the mA and/or kV according to
patient size and/or use of iterative reconstruction technique.

CONTRAST:  80mL OMNIPAQUE IOHEXOL 300 MG/ML  SOLN
FINDINGS: CT CHEST FINDINGS

Cardiovascular: Extensive multi-vessel coronary artery
calcification. Global cardiac size is mildly enlarged. No
pericardial effusion. Central pulmonary arteries are of normal
caliber. Extensive atherosclerotic calcification noted within the
thoracic aorta. No aortic aneurysm. Particularly prominent
atherosclerotic calcification noted at the origin of the right
vertebral artery

Mediastinum/Nodes: Multinodular thyroid goiter identified with the
dominant nodule within the right lobe measuring 2.7 cm and within
the left lobe measuring 2.3 cm. Infiltration within the anterior
mediastinum does not expand the borders of the anterior mediastinum
and may represent postsurgical change following thymectomy. The
esophagus is unremarkable. No pathologic thoracic adenopathy.

Lungs/Pleura: Small bilateral pleural effusions, right greater than
left. Associated mild bibasilar atelectasis. Left fifth thoracotomy
defect noted with associated subpleural scarring. No confluent
pulmonary infiltrate. No pneumothorax. Central airways are widely
patent.

Musculoskeletal: No acute bone abnormality. No lytic or blastic bone
lesion. No sternal mass identified. There is, however, prominent
anterior angulation of the xiphoid process, best seen on sagittal
image # 98/6.

CT ABDOMEN PELVIS FINDINGS

Hepatobiliary: No focal liver abnormality is seen. No gallstones,
gallbladder wall thickening, or biliary dilatation.

Pancreas: There is cystic dilation of the a main pancreatic duct
within the pancreatic head spanning 19 mm with a maximal diameter of
10 mm. Scattered punctate calculi within the pancreatic parenchyma
suggest changes of chronic pancreatitis. No enhancing intra
pancreatic mass identified. No significant parenchymal atrophy. No
peripancreatic inflammatory change.,

Spleen: Unremarkable

Adrenals/Urinary Tract: The adrenal glands are unremarkable. The
kidneys are atrophic bilaterally. Multiple simple cortical cysts are
seen within the kidneys bilaterally in keeping with cystic disease
of chronic renal failure. No follow-up imaging is recommended for
these lesions. A 8 mm nonobstructing calculus is seen within the
lower pole of the right kidney. Vascular calcifications are noted
within the kidneys bilaterally. No hydronephrosis. No ureteral
calculi. The bladder is unremarkable.

Stomach/Bowel: Stomach is within normal limits. Appendix appears
normal. No evidence of bowel wall thickening, distention, or
inflammatory changes. Mild ascites noted within the pelvis.

Vascular/Lymphatic: Extensive visceral arteriosclerosis. Extensive
aortoiliac atherosclerotic calcification. No aortic aneurysm. No
pathologic adenopathy within the abdomen and pelvis.

Reproductive: Status post hysterectomy. No adnexal masses.

Other: Small fat containing left inguinal hernia. There is diffuse
subcutaneous body wall edema in keeping with anasarca.

Musculoskeletal: Scoliotic and degenerative changes are seen within
the lumbar spine. No acute bone abnormality. No lytic or blastic
bone lesion.
IMPRESSION: No sternal mass identified. Prominent anterior angulation of the
xiphoid process noted, however.

Status post thymectomy. Infiltration within the anterior mediastinum
likely represents post thymectomy change.

Extensive coronary artery calcification.  Mild cardiomegaly.

Mild anasarca with small bilateral pleural effusions, diffuse
subcutaneous body wall edema, and trace ascites.

Multinodular thyroid goiter. Recommend thyroid US (ref: [HOSPITAL]. [DATE]): 143-50).

Cystic dilation of the main pancreatic duct within the pancreatic
head. While this may relate to changes of chronic pancreatitis and
represent the sequela of prior inflammation, and underlying mass is
difficult to exclude. This would better assessed with pancreatic
protocol MRI examination, if indicated.

Changes in keeping with end-stage renal disease. Superimposed mild
right nonobstructing nephrolithiasis.

Aortic Atherosclerosis ([id number]).
